# Patient Record
Sex: Female | Born: 1965 | Race: White | Hispanic: No | Smoking: Never smoker
Health system: Southern US, Community
[De-identification: ages and names within clinical notes are randomized; demographics above are authoritative.]

## PROBLEM LIST (undated history)

## (undated) DIAGNOSIS — R011 Cardiac murmur, unspecified: Secondary | ICD-10-CM

## (undated) DIAGNOSIS — T7840XA Allergy, unspecified, initial encounter: Secondary | ICD-10-CM

## (undated) DIAGNOSIS — R112 Nausea with vomiting, unspecified: Secondary | ICD-10-CM

## (undated) DIAGNOSIS — F419 Anxiety disorder, unspecified: Secondary | ICD-10-CM

## (undated) DIAGNOSIS — J309 Allergic rhinitis, unspecified: Secondary | ICD-10-CM

## (undated) DIAGNOSIS — N838 Other noninflammatory disorders of ovary, fallopian tube and broad ligament: Secondary | ICD-10-CM

## (undated) DIAGNOSIS — Z9889 Other specified postprocedural states: Secondary | ICD-10-CM

## (undated) DIAGNOSIS — E7800 Pure hypercholesterolemia, unspecified: Secondary | ICD-10-CM

## (undated) DIAGNOSIS — M1990 Unspecified osteoarthritis, unspecified site: Secondary | ICD-10-CM

## (undated) DIAGNOSIS — B029 Zoster without complications: Secondary | ICD-10-CM

## (undated) DIAGNOSIS — H6500 Acute serous otitis media, unspecified ear: Secondary | ICD-10-CM

## (undated) DIAGNOSIS — E039 Hypothyroidism, unspecified: Secondary | ICD-10-CM

## (undated) DIAGNOSIS — J209 Acute bronchitis, unspecified: Secondary | ICD-10-CM

## (undated) DIAGNOSIS — D1803 Hemangioma of intra-abdominal structures: Secondary | ICD-10-CM

## (undated) DIAGNOSIS — R062 Wheezing: Secondary | ICD-10-CM

## (undated) DIAGNOSIS — J45909 Unspecified asthma, uncomplicated: Secondary | ICD-10-CM

## (undated) HISTORY — PX: REDUCTION MAMMAPLASTY: SUR839

## (undated) HISTORY — DX: Other specified postprocedural states: Z98.890

## (undated) HISTORY — DX: Anxiety disorder, unspecified: F41.9

## (undated) HISTORY — PX: WISDOM TOOTH EXTRACTION: SHX21

## (undated) HISTORY — DX: Wheezing: R06.2

## (undated) HISTORY — DX: Hypothyroidism, unspecified: E03.9

## (undated) HISTORY — DX: Unspecified asthma, uncomplicated: J45.909

## (undated) HISTORY — DX: Pure hypercholesterolemia, unspecified: E78.00

## (undated) HISTORY — DX: Allergy, unspecified, initial encounter: T78.40XA

## (undated) HISTORY — PX: OTHER SURGICAL HISTORY: SHX169

## (undated) HISTORY — DX: Unspecified osteoarthritis, unspecified site: M19.90

## (undated) HISTORY — PX: SHOULDER ARTHROSCOPY: SHX128

## (undated) HISTORY — DX: Acute serous otitis media, unspecified ear: H65.00

## (undated) HISTORY — DX: Acute bronchitis, unspecified: J20.9

## (undated) HISTORY — DX: Other specified postprocedural states: R11.2

## (undated) HISTORY — DX: Zoster without complications: B02.9

## (undated) HISTORY — DX: Allergic rhinitis, unspecified: J30.9

---

## 2001-08-01 ENCOUNTER — Other Ambulatory Visit: Admission: RE | Admit: 2001-08-01 | Discharge: 2001-08-01 | Payer: Self-pay | Admitting: Obstetrics and Gynecology

## 2002-03-26 ENCOUNTER — Inpatient Hospital Stay (HOSPITAL_COMMUNITY): Admission: AD | Admit: 2002-03-26 | Discharge: 2002-03-26 | Payer: Self-pay | Admitting: Obstetrics and Gynecology

## 2002-05-14 ENCOUNTER — Other Ambulatory Visit: Admission: RE | Admit: 2002-05-14 | Discharge: 2002-05-14 | Payer: Self-pay | Admitting: Obstetrics and Gynecology

## 2002-12-19 ENCOUNTER — Inpatient Hospital Stay (HOSPITAL_COMMUNITY): Admission: AD | Admit: 2002-12-19 | Discharge: 2002-12-21 | Payer: Self-pay | Admitting: Obstetrics and Gynecology

## 2002-12-24 ENCOUNTER — Encounter: Admission: RE | Admit: 2002-12-24 | Discharge: 2003-01-23 | Payer: Self-pay | Admitting: Obstetrics and Gynecology

## 2003-01-24 ENCOUNTER — Other Ambulatory Visit: Admission: RE | Admit: 2003-01-24 | Discharge: 2003-01-24 | Payer: Self-pay | Admitting: Obstetrics and Gynecology

## 2003-09-16 ENCOUNTER — Emergency Department (HOSPITAL_COMMUNITY): Admission: EM | Admit: 2003-09-16 | Discharge: 2003-09-16 | Payer: Self-pay | Admitting: Emergency Medicine

## 2004-01-31 ENCOUNTER — Encounter: Payer: Self-pay | Admitting: Internal Medicine

## 2004-01-31 LAB — CONVERTED CEMR LAB

## 2004-03-24 ENCOUNTER — Other Ambulatory Visit: Admission: RE | Admit: 2004-03-24 | Discharge: 2004-03-24 | Payer: Self-pay | Admitting: Obstetrics and Gynecology

## 2004-10-02 ENCOUNTER — Ambulatory Visit: Payer: Self-pay | Admitting: Internal Medicine

## 2005-02-10 ENCOUNTER — Ambulatory Visit: Payer: Self-pay | Admitting: Internal Medicine

## 2005-03-22 ENCOUNTER — Ambulatory Visit: Payer: Self-pay | Admitting: Internal Medicine

## 2005-08-23 ENCOUNTER — Ambulatory Visit: Payer: Self-pay | Admitting: Internal Medicine

## 2007-01-19 ENCOUNTER — Ambulatory Visit: Payer: Self-pay | Admitting: Internal Medicine

## 2007-03-17 ENCOUNTER — Ambulatory Visit: Payer: Self-pay | Admitting: Internal Medicine

## 2007-08-21 ENCOUNTER — Encounter: Payer: Self-pay | Admitting: Internal Medicine

## 2007-08-21 DIAGNOSIS — E7800 Pure hypercholesterolemia, unspecified: Secondary | ICD-10-CM

## 2007-08-21 DIAGNOSIS — E039 Hypothyroidism, unspecified: Secondary | ICD-10-CM

## 2007-08-21 HISTORY — DX: Pure hypercholesterolemia, unspecified: E78.00

## 2007-11-24 ENCOUNTER — Encounter: Payer: Self-pay | Admitting: Internal Medicine

## 2008-03-01 ENCOUNTER — Ambulatory Visit: Payer: Self-pay | Admitting: Internal Medicine

## 2008-03-01 DIAGNOSIS — E785 Hyperlipidemia, unspecified: Secondary | ICD-10-CM | POA: Insufficient documentation

## 2008-03-01 DIAGNOSIS — J309 Allergic rhinitis, unspecified: Secondary | ICD-10-CM

## 2008-03-01 HISTORY — DX: Allergic rhinitis, unspecified: J30.9

## 2008-03-15 ENCOUNTER — Ambulatory Visit: Payer: Self-pay | Admitting: Internal Medicine

## 2008-03-15 DIAGNOSIS — H6500 Acute serous otitis media, unspecified ear: Secondary | ICD-10-CM

## 2008-03-15 HISTORY — DX: Acute serous otitis media, unspecified ear: H65.00

## 2008-06-07 ENCOUNTER — Telehealth (INDEPENDENT_AMBULATORY_CARE_PROVIDER_SITE_OTHER): Payer: Self-pay | Admitting: *Deleted

## 2008-06-07 ENCOUNTER — Ambulatory Visit: Payer: Self-pay | Admitting: Internal Medicine

## 2008-06-07 DIAGNOSIS — B029 Zoster without complications: Secondary | ICD-10-CM | POA: Insufficient documentation

## 2008-06-07 HISTORY — DX: Zoster without complications: B02.9

## 2008-10-14 ENCOUNTER — Ambulatory Visit: Payer: Self-pay | Admitting: Internal Medicine

## 2008-10-14 DIAGNOSIS — J45909 Unspecified asthma, uncomplicated: Secondary | ICD-10-CM

## 2008-10-14 DIAGNOSIS — J209 Acute bronchitis, unspecified: Secondary | ICD-10-CM

## 2008-10-14 HISTORY — DX: Acute bronchitis, unspecified: J20.9

## 2008-12-04 ENCOUNTER — Encounter: Admission: RE | Admit: 2008-12-04 | Discharge: 2008-12-04 | Payer: Self-pay | Admitting: Endocrinology

## 2009-02-03 ENCOUNTER — Ambulatory Visit: Payer: Self-pay | Admitting: Internal Medicine

## 2009-02-12 ENCOUNTER — Encounter: Payer: Self-pay | Admitting: Internal Medicine

## 2009-04-03 ENCOUNTER — Ambulatory Visit: Payer: Self-pay | Admitting: Internal Medicine

## 2009-10-14 ENCOUNTER — Ambulatory Visit: Payer: Self-pay | Admitting: Internal Medicine

## 2009-10-14 DIAGNOSIS — R062 Wheezing: Secondary | ICD-10-CM

## 2009-10-14 HISTORY — DX: Wheezing: R06.2

## 2009-12-02 ENCOUNTER — Encounter: Payer: Self-pay | Admitting: Internal Medicine

## 2010-02-09 ENCOUNTER — Ambulatory Visit: Payer: Self-pay | Admitting: Internal Medicine

## 2010-12-03 NOTE — Assessment & Plan Note (Signed)
Summary: tb skin test/Loza Prell/cd  Nurse Visit   Allergies: No Known Drug Allergies  Immunizations Administered:  PPD Skin Test:    Vaccine Type: PPD    Site: right forearm    Dose: 0.1 ml    Route: ID    Given by: Lucious Groves    Exp. Date: 02/25/2011    Lot #: [id number]  PPD Results    Date of reading: 02/11/2010    Results: < 5mm    Interpretation: negative  Orders Added: 1)  TB Skin Test [86580] 2)  Admin 1st Vaccine [82956]

## 2010-12-03 NOTE — Letter (Signed)
Summary: [facility]  [facility]   Imported By: Lester [place] 12/22/2009 09:45:36  _____________________________________________________________________  External Attachment:    Type:   Image     Comment:   External Document

## 2010-12-18 ENCOUNTER — Encounter: Payer: Self-pay | Admitting: Internal Medicine

## 2010-12-23 NOTE — Miscellaneous (Signed)
Summary: Immunization Entry   Immunization History:  Influenza Immunization History:    Influenza:  historical (12/17/2010)   Immunization History:  Influenza Immunization History:    Influenza:  historical (12/17/2010)  [facility] [place] Influenza Site- Right Del[employer].- [employer] Lot- # [id number] Date -12/17/2010

## 2011-01-29 ENCOUNTER — Encounter: Payer: Self-pay | Admitting: Internal Medicine

## 2011-01-29 ENCOUNTER — Ambulatory Visit (INDEPENDENT_AMBULATORY_CARE_PROVIDER_SITE_OTHER): Payer: BC Managed Care – PPO | Admitting: Internal Medicine

## 2011-01-29 VITALS — BP 100/70 | HR 74 | Temp 98.4°F | Ht 69.0 in | Wt 183.2 lb

## 2011-01-29 DIAGNOSIS — F988 Other specified behavioral and emotional disorders with onset usually occurring in childhood and adolescence: Secondary | ICD-10-CM

## 2011-01-29 DIAGNOSIS — Z0000 Encounter for general adult medical examination without abnormal findings: Secondary | ICD-10-CM

## 2011-01-29 DIAGNOSIS — Z0001 Encounter for general adult medical examination with abnormal findings: Secondary | ICD-10-CM | POA: Insufficient documentation

## 2011-01-29 MED ORDER — AMPHETAMINE-DEXTROAMPHET ER 20 MG PO CP24: ORAL_CAPSULE | ORAL | Status: DC

## 2011-01-29 NOTE — Patient Instructions (Signed)
Take all new medications as prescribed Continue all other medications as before Please return in 1 mo with Lab testing done 3-5 days before

## 2011-01-29 NOTE — Assessment & Plan Note (Signed)
New diagnosis, satisfies the criteria, and I suspect some mild anxiety which she minimizes, but no other signficant depression or other today;  Will start the adderall ER 20 qd , f/u 1 mo for efficacy, pt counseled on controlled nature of the med, potential side effects and expected efficacy

## 2011-01-29 NOTE — Progress Notes (Signed)
  Subjective:    Patient ID: Stephanie Schwartz, female    DOB: Feb 07, 1966, 45 y.o.   MRN: [id number]  HPI  Here to f/u  - has a new job, Airline pilot for medical device position, working on a computer and phone quite a bit daily with numerous client contacts and reports to be generated; has done this work before but now in different way with  4-5 clients but with much traveling inbetween, feels somewhat unable to focus, comprehend, task completion is difficult and feels as if she is not being able to keep up at this higher level of productivity.  Has always had some difficultly with wandering mind and focus but has not bee limiting with her work activity so far, had some difficulty in school but was able to push through that.  Denies worsening depressive symptoms, suicidal ideation, or panic.  Just hard to manage all the tasks at one time to be eventaully completed .  Has a brother with similar symptoms but no dx or treatment.   Has brother in law, and daughter with treatment for ADD  Past Medical History  Diagnosis Date  . SHINGLES 06/07/2008  . HYPOTHYROIDISM 08/21/2007  . HYPERCHOLESTEROLEMIA 08/21/2007  . HYPERLIPIDEMIA 03/01/2008  . OTITIS MEDIA, SEROUS, ACUTE, LEFT 03/15/2008  . ASTHMATIC BRONCHITIS, ACUTE 10/14/2008  . ALLERGIC RHINITIS 03/01/2008  . ASTHMA 10/14/2008  . Wheezing 10/14/2009   No past surgical history on file.  reports that she has never smoked. She does not have any smokeless tobacco history on file. She reports that she does not drink alcohol. Her drug history not on file. family history includes Alcohol abuse in her other; Cancer in her other; Diabetes in her other; Hyperlipidemia in her other; and Hypertension in her other. No Known Allergies  Review of Systems Review of Systems  Constitutional: Negative for diaphoresis and unexpected weight change.  HENT: Negative for drooling and tinnitus.   Eyes: Negative for photophobia and visual disturbance.  Respiratory: Negative for  choking and stridor.   Gastrointestinal: Negative for vomiting and blood in stool.  Genitourinary: Negative for hematuria and decreased urine volume.  Musculoskeletal: Negative for gait problem.  Skin: Negative for color change and wound.  Neurological: Negative for tremors and numbness.  Psychiatric/BehavioraL:  As above     Objective:   Physical Exam BP 100/70  Pulse 74  Temp(Src) 98.4 F (36.9 C) (Oral)  Ht 5\' 9"  (1.753 m)  Wt 183 lb 4 oz (83.122 kg)  BMI 27.06 kg/m2  SpO2 99%  LMP 01/29/2011 Physical Exam  VS noted Constitutional: Pt appears well-developed and well-nourished.  HENT: Head: Normocephalic.  Right Ear: External ear normal.  Left Ear: External ear normal.  Eyes: Conjunctivae and EOM are normal. Pupils are equal, round, and reactive to light.  Neck: Normal range of motion. Neck supple.  Cardiovascular: Normal rate and regular rhythm.   Pulmonary/Chest: Effort normal and breath sounds normal.  Abd:  Soft, NT, non-distended, + BS Neurological: Pt is alert. No cranial nerve deficit.  Skin: Skin is warm. No erythema.  Psychiatric: Pt behavior is normal. Thought content normal. 1+ nervous, somewhat hyperactive today         Assessment & Plan:

## 2011-03-01 ENCOUNTER — Ambulatory Visit (INDEPENDENT_AMBULATORY_CARE_PROVIDER_SITE_OTHER): Payer: BC Managed Care – PPO | Admitting: Internal Medicine

## 2011-03-01 ENCOUNTER — Encounter: Payer: Self-pay | Admitting: Internal Medicine

## 2011-03-01 VITALS — BP 110/78 | HR 67 | Temp 98.2°F | Ht 69.0 in | Wt 184.2 lb

## 2011-03-01 DIAGNOSIS — Z0000 Encounter for general adult medical examination without abnormal findings: Secondary | ICD-10-CM

## 2011-03-01 DIAGNOSIS — F988 Other specified behavioral and emotional disorders with onset usually occurring in childhood and adolescence: Secondary | ICD-10-CM

## 2011-03-01 MED ORDER — AMPHETAMINE-DEXTROAMPHET ER 20 MG PO CP24: ORAL_CAPSULE | ORAL | Status: DC

## 2011-03-01 NOTE — Progress Notes (Signed)
Subjective:    Patient ID: Stephanie Schwartz, female    DOB: [birth year], 45 y.o.   MRN: [id number]  HPI Here for wellness and f/u;  Overall doing ok;  Pt denies CP, worsening SOB, DOE, wheezing, orthopnea, PND, worsening LE edema, palpitations, dizziness or syncope.  Pt denies neurological change such as new Headache, facial or extremity weakness.  Pt denies polydipsia, polyuria, or low sugar symptoms. Pt states overall good compliance with treatment and medications, good tolerability, and trying to follow lower cholesterol diet.  Pt denies worsening depressive symptoms, suicidal ideation or panic. No fever, wt loss, night sweats, loss of appetite, or other constitutional symptoms.  Pt states good ability with ADL's, low fall risk, home safety reviewed and adequate, no significant changes in hearing or vision, and occasionally active with exercise.  Adderall working well at current dosing with respect to task completion and concentration ability, work International aid/development worker.  Does not need TSH done today as she just had it done 1 mo ago with endo. Past Medical History  Diagnosis Date  . SHINGLES 06/07/2008  . HYPOTHYROIDISM 08/21/2007  . HYPERCHOLESTEROLEMIA 08/21/2007  . HYPERLIPIDEMIA 03/01/2008  . OTITIS MEDIA, SEROUS, ACUTE, LEFT 03/15/2008  . ASTHMATIC BRONCHITIS, ACUTE 10/14/2008  . ALLERGIC RHINITIS 03/01/2008  . ASTHMA 10/14/2008  . Wheezing 10/14/2009   No past surgical history on file.  reports that she has never smoked. She does not have any smokeless tobacco history on file. She reports that she does not drink alcohol. Her drug history not on file. family history includes Alcohol abuse in her other; Cancer in her other; Diabetes in her other; Hyperlipidemia in her other; and Hypertension in her other. No Known Allergies Current Outpatient Prescriptions on File Prior to Visit  Medication Sig Dispense Refill  . albuterol (VENTOLIN HFA) 108 (90 BASE) MCG/ACT inhaler Inhale 2 puffs into the lungs every  4 (four) hours as needed. For wheezing       . amphetamine-dextroamphetamine (ADDERALL XR, 20MG ,) 20 MG 24 hr capsule 1 by mouth per day - to fill Jan 29, 2011  30 capsule  0  . fluticasone (FLOVENT HFA) 110 MCG/ACT inhaler Inhale 1 puff into the lungs 2 (two) times daily.        Marland Kitchen levothyroxine (SYNTHROID, LEVOTHROID) 175 MCG tablet Take 175 mcg by mouth daily.        Marland Kitchen loratadine-pseudoephedrine (CLARITIN-D 24-HOUR) 10-240 MG per 24 hr tablet Take 1 tablet by mouth daily.        Marland Kitchen azithromycin (ZITHROMAX) 250 MG tablet Take 2 tablets by mouth on day 1, followed by 1 tablet by mouth daily for 4 days. 2 by mouth for 1 day, then 1 by mouth every day for 4 days, then stop.       Marland Kitchen HYDROcodone-homatropine (HYCODAN) 5-1.5 MG/5ML syrup Take by mouth. 1 teaspoon by mouth every 6 hours as needed for cough       . methylPREDNISolone (MEDROL DOSEPACK) 4 MG tablet Take 4 mg by mouth. Use as directed by mouth x 1 pack        Review of Systems Review of Systems  Constitutional: Negative for diaphoresis, activity change, appetite change and unexpected weight change.  HENT: Negative for hearing loss, ear pain, facial swelling, mouth sores and neck stiffness.   Eyes: Negative for pain, redness and visual disturbance.  Respiratory: Negative for shortness of breath and wheezing.   Cardiovascular: Negative for chest pain and palpitations.  Gastrointestinal: Negative for diarrhea, blood in stool, abdominal distention  and rectal pain.  Genitourinary: Negative for hematuria, flank pain and decreased urine volume.  Musculoskeletal: Negative for myalgias and joint swelling.  Skin: Negative for color change and wound.  Neurological: Negative for syncope and numbness.  Hematological: Negative for adenopathy.  Psychiatric/Behavioral: Negative for hallucinations, self-injury, decreased concentration and agitation.      Objective:   Physical Exam BP 110/78  Pulse 67  Temp(Src) 98.2 F (36.8 C) (Oral)  Ht 5\' 9"   (1.753 m)  Wt 184 lb 4 oz (83.575 kg)  BMI 27.21 kg/m2  SpO2 97%  LMP 02/24/2011 Physical Exam  VS noted Constitutional: Pt is oriented to person, place, and time. Appears well-developed and well-nourished.  HENT:  Head: Normocephalic and atraumatic.  Right Ear: External ear normal.  Left Ear: External ear normal.  Nose: Nose normal.  Mouth/Throat: Oropharynx is clear and moist.  Eyes: Conjunctivae and EOM are normal. Pupils are equal, round, and reactive to light.  Neck: Normal range of motion. Neck supple. No JVD present. No tracheal deviation present.  Cardiovascular: Normal rate, regular rhythm, normal heart sounds and intact distal pulses.   Pulmonary/Chest: Effort normal and breath sounds normal.  Abdominal: Soft. Bowel sounds are normal. There is no tenderness.  Musculoskeletal: Normal range of motion. Exhibits no edema.  Lymphadenopathy:  Has no cervical adenopathy.  Neurological: Pt is alert and oriented to person, place, and time. Pt has normal reflexes. No cranial nerve deficit.  Skin: Skin is warm and dry. No rash noted.  Psychiatric:  Has  normal mood and affect. Behavior is normal.         Assessment & Plan:

## 2011-03-01 NOTE — Patient Instructions (Addendum)
Please tell the lab you do not need the TSH done (OK to cancel) Please go to LAB in the Basement for the blood and/or urine tests to be done today Please call the number on the Blue Card (the [employer] System) for results of testing in 2-3 days Continue all other medications as before Please return in 1 year for your yearly visit, or sooner if needed, with Lab testing done 3-5 days before Please remember to followup with your GYN for the yearly pap smear and/or mammogram, as you do

## 2011-03-01 NOTE — Assessment & Plan Note (Signed)

## 2011-03-19 NOTE — H&P (Signed)
   NAMECHAVON, LUCARELLI                          ACCOUNT NO.:  [id number]   MEDICAL RECORD NO.:  [id number]                   PATIENT TYPE:  INP   LOCATION:  9169                                 FACILITY:  [facility]   PHYSICIAN:  Lenoard Aden, M.D.             DATE OF BIRTH:  Mar 17, 1966   DATE OF ADMISSION:  12/19/2002  DATE OF DISCHARGE:                                HISTORY & PHYSICAL   CHIEF COMPLAINT:  Active labor.   HISTORY OF PRESENT ILLNESS:  The patient is a 45 year old white female, G1,  P0, EDD of December 16, 2002 who presents in active labor at 40-3/7 weeks.   MEDICATIONS:  Prenatal vitamins and thyroid replacement.   PAST MEDICAL HISTORY:  Hypothyroidism, previous surgery of breast reduction.   FAMILY HISTORY:  Hypertension and adult onset diabetes mellitus.   PHYSICAL EXAMINATION:  GENERAL:  Well-developed, well-nourished white female  in no apparent distress.  HEENT:  Normal.  LUNGS:  Clear.  HEART:  Regular rate and rhythm.  ABDOMEN:  Soft, gravid and nontender.  PELVIC:  Cervix is 5-6 cm, 100% and 0 station.  EXTREMITIES:  No clubbing, cyanosis or edema.  NEUROLOGIC:  Nonfocal.   IMPRESSION:  Term intrauterine pregnancy in active labor.   PLAN:  Proceed with augmentation epidural and attempts at vaginal delivery.                                               Lenoard Aden, M.D.    RJT/MEDQ  D:  12/19/2002  T:  12/19/2002  Job:  [id number]

## 2011-04-06 ENCOUNTER — Ambulatory Visit: Payer: BC Managed Care – PPO | Admitting: Endocrinology

## 2011-10-27 ENCOUNTER — Encounter: Payer: Self-pay | Admitting: Internal Medicine

## 2011-10-27 ENCOUNTER — Telehealth: Payer: Self-pay

## 2011-10-27 ENCOUNTER — Ambulatory Visit (INDEPENDENT_AMBULATORY_CARE_PROVIDER_SITE_OTHER): Payer: BC Managed Care – PPO | Admitting: Internal Medicine

## 2011-10-27 VITALS — BP 112/82 | HR 70 | Temp 97.9°F | Ht 69.0 in | Wt 174.2 lb

## 2011-10-27 DIAGNOSIS — R062 Wheezing: Secondary | ICD-10-CM

## 2011-10-27 DIAGNOSIS — B009 Herpesviral infection, unspecified: Secondary | ICD-10-CM

## 2011-10-27 DIAGNOSIS — B001 Herpesviral vesicular dermatitis: Secondary | ICD-10-CM

## 2011-10-27 DIAGNOSIS — J209 Acute bronchitis, unspecified: Secondary | ICD-10-CM

## 2011-10-27 DIAGNOSIS — Z0000 Encounter for general adult medical examination without abnormal findings: Secondary | ICD-10-CM

## 2011-10-27 MED ORDER — LEVOFLOXACIN 250 MG PO TABS: 250.0000 mg | ORAL_TABLET | Freq: Every day | ORAL | Status: DC

## 2011-10-27 MED ORDER — PREDNISONE 10 MG PO TABS: 10.0000 mg | ORAL_TABLET | Freq: Every day | ORAL | Status: DC

## 2011-10-27 MED ORDER — LEVOFLOXACIN 250 MG PO TABS: 250.0000 mg | ORAL_TABLET | Freq: Every day | ORAL | Status: AC

## 2011-10-27 MED ORDER — VALACYCLOVIR HCL 1 G PO TABS: 1000.0000 mg | ORAL_TABLET | Freq: Two times a day (BID) | ORAL | Status: DC

## 2011-10-27 MED ORDER — HYDROCODONE-HOMATROPINE 5-1.5 MG/5ML PO SYRP: 5.0000 mL | ORAL_SOLUTION | Freq: Four times a day (QID) | ORAL | Status: AC | PRN

## 2011-10-27 NOTE — Assessment & Plan Note (Signed)
Mild to mod, for prednisone course,  to f/u any worsening symptoms or concerns 

## 2011-10-27 NOTE — Telephone Encounter (Signed)
Patient has cold sores (2nd occurrence in the past 2 months) and would like a prescription called in. Call back number for the patient is [phone]

## 2011-10-27 NOTE — Assessment & Plan Note (Signed)
Mild to mod, for antibx course,  to f/u any worsening symptoms or concerns 

## 2011-10-27 NOTE — Telephone Encounter (Signed)
Called the patient left message prescription requested has been sent to pharmacy.

## 2011-10-27 NOTE — Progress Notes (Signed)
Subjective:    Patient ID: Stephanie Schwartz, female    DOB: 07-13-1966, 45 y.o.   MRN: [id number]  HPI   Here with 3 days acute onset fever, facial pain, pressure, general weakness and malaise, and greenish d/c. Also with Here with acute onset mild to mod 2-3 days ST, HA, general weakness and malaise, with prod cough greenish sputum, but Pt denies chest pain, increased sob or doe, wheezing, orthopnea, PND, increased LE swelling, palpitations, dizziness or syncope, except for onset mild wheezing this am.  No other acute complaints,  Not pregnant. Using her inhaler at least twice per day for the last 3 days with decreased dyspnea on exertion.  Also with onset cold sore to right upper lip today, second episode in 3 months, no prior hx before that.   Past Medical History  Diagnosis Date  . SHINGLES 06/07/2008  . HYPOTHYROIDISM 08/21/2007  . HYPERCHOLESTEROLEMIA 08/21/2007  . HYPERLIPIDEMIA 03/01/2008  . OTITIS MEDIA, SEROUS, ACUTE, LEFT 03/15/2008  . ASTHMATIC BRONCHITIS, ACUTE 10/14/2008  . ALLERGIC RHINITIS 03/01/2008  . ASTHMA 10/14/2008  . Wheezing 10/14/2009   No past surgical history on file.  reports that she has never smoked. She does not have any smokeless tobacco history on file. She reports that she does not drink alcohol. Her drug history not on file. family history includes Alcohol abuse in her other; Cancer in her other; Diabetes in her other; Hyperlipidemia in her other; and Hypertension in her other. No Known Allergies Current Outpatient Prescriptions on File Prior to Visit  Medication Sig Dispense Refill  . albuterol (VENTOLIN HFA) 108 (90 BASE) MCG/ACT inhaler Inhale 2 puffs into the lungs every 4 (four) hours as needed. For wheezing       . amphetamine-dextroamphetamine (ADDERALL XR, 20MG ,) 20 MG 24 hr capsule 1 by mouth per day - to fill April 29, 2011  30 capsule  0  . fluticasone (FLOVENT HFA) 110 MCG/ACT inhaler Inhale 1 puff into the lungs 2 (two) times daily.        Marland Kitchen  loratadine-pseudoephedrine (CLARITIN-D 24-HOUR) 10-240 MG per 24 hr tablet Take 1 tablet by mouth daily.        Marland Kitchen levothyroxine (SYNTHROID, LEVOTHROID) 175 MCG tablet Take 175 mcg by mouth daily.         Review of Systems Review of Systems  Constitutional: Negative for diaphoresis and unexpected weight change.  HENT: Negative for drooling and tinnitus.   Eyes: Negative for photophobia and visual disturbance.  Respiratory: Negative for choking and stridor.   Gastrointestinal: Negative for vomiting and blood in stool.  Genitourinary: Negative for hematuria and decreased urine volume.  Objective:   Physical Exam BP 112/82  Pulse 70  Temp(Src) 97.9 F (36.6 C) (Oral)  Ht 5\' 9"  (1.753 m)  Wt 174 lb 4 oz (79.039 kg)  BMI 25.73 kg/m2  SpO2 98%  LMP 09/25/2011 Physical Exam  VS noted Constitutional: Pt appears well-developed and well-nourished.  HENT: Head: Normocephalic.  Right Ear: External ear normal.  Left Ear: External ear normal.  Right upper lip with onset typical early cold sore without ulceration but tender, swelling approx 5 mm Bilat tm's mild erythema.  Sinus tender bilat.  Pharynx mild erythema Eyes: Conjunctivae and EOM are normal. Pupils are equal, round, and reactive to light.  Neck: Normal range of motion. Neck supple.  Cardiovascular: Normal rate and regular rhythm.   Pulmonary/Chest: Effort normal and breath sounds mild decreased with mild bilat wheeze.  Neurological: Pt is alert. No cranial  nerve deficit.  Skin: Skin is warm. No erythema.  Psychiatric: Pt behavior is normal. Thought content normal.     Assessment & Plan:

## 2011-10-27 NOTE — Patient Instructions (Addendum)
Take all new medications as prescribed - the antibiotic, cough medicine, prednisone, and generic valtrex Continue all other medications as before Please return in 6 mo with Lab testing done 3-5 days before

## 2011-10-27 NOTE — Telephone Encounter (Signed)
Ok for valtrex asd - done per [employer]

## 2011-12-22 ENCOUNTER — Other Ambulatory Visit: Payer: Self-pay | Admitting: Internal Medicine

## 2011-12-22 MED ORDER — LEVOFLOXACIN 250 MG PO TABS: 250.0000 mg | ORAL_TABLET | Freq: Every day | ORAL | Status: AC

## 2011-12-22 NOTE — Telephone Encounter (Signed)
Pt partner here today; pt with overt sinusitis, out of town - for antibx

## 2012-02-25 ENCOUNTER — Ambulatory Visit (INDEPENDENT_AMBULATORY_CARE_PROVIDER_SITE_OTHER): Payer: BC Managed Care – PPO | Admitting: Internal Medicine

## 2012-02-25 ENCOUNTER — Encounter: Payer: Self-pay | Admitting: Internal Medicine

## 2012-02-25 VITALS — BP 108/78 | HR 71 | Temp 98.1°F | Ht 69.0 in | Wt 168.2 lb

## 2012-02-25 DIAGNOSIS — F988 Other specified behavioral and emotional disorders with onset usually occurring in childhood and adolescence: Secondary | ICD-10-CM

## 2012-02-25 DIAGNOSIS — F419 Anxiety disorder, unspecified: Secondary | ICD-10-CM | POA: Insufficient documentation

## 2012-02-25 DIAGNOSIS — G4700 Insomnia, unspecified: Secondary | ICD-10-CM

## 2012-02-25 DIAGNOSIS — F411 Generalized anxiety disorder: Secondary | ICD-10-CM

## 2012-02-25 HISTORY — DX: Anxiety disorder, unspecified: F41.9

## 2012-02-25 MED ORDER — ALPRAZOLAM 0.5 MG PO TABS: ORAL_TABLET | ORAL | Status: DC

## 2012-02-25 MED ORDER — ZOLPIDEM TARTRATE ER 12.5 MG PO TBCR: 12.5000 mg | EXTENDED_RELEASE_TABLET | Freq: Every evening | ORAL | Status: DC | PRN

## 2012-02-25 MED ORDER — ALPRAZOLAM 0.5 MG PO TBDP: ORAL_TABLET | ORAL | Status: DC

## 2012-02-25 NOTE — Patient Instructions (Addendum)
Take all new medications as prescribed Continue all other medications as before Please return in 3 mo with Lab testing done 3-5 days before, or sooner if needed

## 2012-02-27 ENCOUNTER — Encounter: Payer: Self-pay | Admitting: Internal Medicine

## 2012-02-27 MED ORDER — BUDESONIDE-FORMOTEROL FUMARATE 160-4.5 MCG/ACT IN AERO: 2.0000 | INHALATION_SPRAY | Freq: Two times a day (BID) | RESPIRATORY_TRACT | Status: DC

## 2012-02-27 MED ORDER — THYROID 180 MG PO TABS: 180.0000 mg | ORAL_TABLET | Freq: Every day | ORAL | Status: DC

## 2012-02-27 NOTE — Progress Notes (Signed)
  Subjective:    Patient ID: Stephanie Schwartz, female    DOB: [birth year], 46 y.o.   MRN: [id number]  HPI  Here to f/u, c/o 3 months acute onset marked anxiety and mild depressive symptoms related to recent relationship difficulties with signficant other;  Has been several sessions with counseling which has helped, but still with episodes of near panic, very uncomfortable, also with marked difficulty sleeping with hard to get to sleep, and keeps waking up at 3am.  Denies suicidal ideation.  Not taking adderall recently, not needed at this time per pt.  On symbicort and armour thyroid per endo, Denies hyper or hypo thyroid symptoms such as voice, skin or hair change.  Past Medical History  Diagnosis Date  . SHINGLES 06/07/2008  . HYPOTHYROIDISM 08/21/2007  . HYPERCHOLESTEROLEMIA 08/21/2007  . HYPERLIPIDEMIA 03/01/2008  . OTITIS MEDIA, SEROUS, ACUTE, LEFT 03/15/2008  . ASTHMATIC BRONCHITIS, ACUTE 10/14/2008  . ALLERGIC RHINITIS 03/01/2008  . ASTHMA 10/14/2008  . Wheezing 10/14/2009  . Anxiety 02/25/2012   History reviewed. No pertinent past surgical history.  reports that she has never smoked. She has never used smokeless tobacco. She reports that she does not drink alcohol or use illicit drugs. family history includes Alcohol abuse in her other; Cancer in her other; Diabetes in her other; Hyperlipidemia in her other; and Hypertension in her other. No Known Allergies Current Outpatient Prescriptions on File Prior to Visit  Medication Sig Dispense Refill  . albuterol (VENTOLIN HFA) 108 (90 BASE) MCG/ACT inhaler Inhale 2 puffs into the lungs every 4 (four) hours as needed. For wheezing       . loratadine-pseudoephedrine (CLARITIN-D 24-HOUR) 10-240 MG per 24 hr tablet Take 1 tablet by mouth daily.        . budesonide-formoterol (SYMBICORT) 160-4.5 MCG/ACT inhaler Inhale 2 puffs into the lungs 2 (two) times daily.  1 Inhaler  12  . thyroid (ARMOUR THYROID) 180 MG tablet Take 1 tablet (180 mg total) by  mouth daily.  90 tablet  3  . zolpidem (AMBIEN CR) 12.5 MG CR tablet Take 1 tablet (12.5 mg total) by mouth at bedtime as needed for sleep.  30 tablet  2   Review of Systems Review of Systems  Constitutional: Negative for diaphoresis and unexpected weight change.  Eyes: Negative for photophobia and visual disturbance. or HA Respiratory: Negative for choking and stridor.   Gastrointestinal: Negative for vomiting and blood in stool.  Genitourinary: Negative for hematuria and decreased urine volume.  Musculoskeletal: Negative for gait problem.  Skin: Negative for color change and wound.  Neurological: Negative for tremors and numbness.    Objective:   Physical Exam BP 108/78  Pulse 71  Temp(Src) 98.1 F (36.7 C) (Oral)  Ht 5\' 9"  (1.753 m)  Wt 168 lb 4 oz (76.318 kg)  BMI 24.85 kg/m2  SpO2 97% Physical Exam  VS noted Constitutional: Pt appears well-developed and well-nourished.  HENT: Head: Normocephalic.  Right Ear: External ear normal.  Left Ear: External ear normal.  Eyes: Conjunctivae and EOM are normal. Pupils are equal, round, and reactive to light.  Neck: Normal range of motion. Neck supple.  Cardiovascular: Normal rate and regular rhythm.   Pulmonary/Chest: Effort normal and breath sounds normal.  Psychiatric: Pt behavior is normal. Thought content normal. 2+ nervous, tearful, not depressed affect    Assessment & Plan:

## 2012-02-27 NOTE — Assessment & Plan Note (Signed)
Mild situational - for xanax prn,  to f/u any worsening symptoms or concerns

## 2012-02-27 NOTE — Assessment & Plan Note (Signed)
stable overall by hx and exam, currently not taking meds,  to f/u any worsening symptoms or concerns

## 2012-02-27 NOTE — Assessment & Plan Note (Signed)
Mild situational, cont cousneling, for ambien prn,  to f/u any worsening symptoms or concerns

## 2012-02-28 ENCOUNTER — Telehealth: Payer: Self-pay

## 2012-02-28 MED ORDER — ESCITALOPRAM OXALATE 10 MG PO TABS: 10.0000 mg | ORAL_TABLET | Freq: Every day | ORAL | Status: DC

## 2012-02-28 NOTE — Telephone Encounter (Signed)
The patient called to inform she has seen her therapist.  The therapist did recommend that the patient would be a good candidate for Antidepressants.  The patient stated JWJ had requested trying Lexapro. Call back number is [phone] please advise

## 2012-02-28 NOTE — Telephone Encounter (Signed)
Done per emr 

## 2012-02-28 NOTE — Telephone Encounter (Signed)
Patient informed. 

## 2012-04-24 ENCOUNTER — Other Ambulatory Visit: Payer: Self-pay | Admitting: Internal Medicine

## 2012-04-24 NOTE — Telephone Encounter (Signed)
Done hardcopy to robin  

## 2012-04-25 NOTE — Telephone Encounter (Signed)
Faxed hardcopy to pharmacy. 

## 2012-05-17 ENCOUNTER — Other Ambulatory Visit (INDEPENDENT_AMBULATORY_CARE_PROVIDER_SITE_OTHER): Payer: BC Managed Care – PPO

## 2012-05-17 DIAGNOSIS — Z0000 Encounter for general adult medical examination without abnormal findings: Secondary | ICD-10-CM

## 2012-05-17 LAB — URINALYSIS, ROUTINE W REFLEX MICROSCOPIC
Ketones, ur: NEGATIVE
Specific Gravity, Urine: 1.015 (ref 1.000–1.030)
Total Protein, Urine: NEGATIVE
Urine Glucose: NEGATIVE

## 2012-05-17 LAB — LIPID PANEL
Cholesterol: 230 mg/dL — ABNORMAL HIGH (ref 0–200)
HDL: 68.2 mg/dL (ref >39.00)
Total CHOL/HDL Ratio: 3
Triglycerides: 85 mg/dL (ref 0.0–149.0)
VLDL: 17 mg/dL (ref 0.0–40.0)

## 2012-05-17 LAB — HEPATIC FUNCTION PANEL
ALT: 22 U/L (ref 0–35)
AST: 21 U/L (ref 0–37)
Albumin: 4 g/dL (ref 3.5–5.2)
Alkaline Phosphatase: 55 U/L (ref 39–117)
Total Bilirubin: 0.4 mg/dL (ref 0.3–1.2)

## 2012-05-17 LAB — BASIC METABOLIC PANEL
BUN: 13 mg/dL (ref 6–23)
Chloride: 106 mEq/L (ref 96–112)
Glucose, Bld: 90 mg/dL (ref 70–99)
Potassium: 4.6 mEq/L (ref 3.5–5.1)

## 2012-05-17 LAB — CBC WITH DIFFERENTIAL/PLATELET
Basophils Relative: 1.2 % (ref 0.0–3.0)
Eosinophils Absolute: 0.3 10*3/uL (ref 0.0–0.7)
Lymphs Abs: 1.5 10*3/uL (ref 0.7–4.0)
MCV: 97.7 fl (ref 78.0–100.0)
Neutro Abs: 3.3 10*3/uL (ref 1.4–7.7)
Neutrophils Relative %: 56.9 % (ref 43.0–77.0)
Platelets: 273 10*3/uL (ref 150.0–400.0)
RDW: 13.2 % (ref 11.5–14.6)
WBC: 5.8 10*3/uL (ref 4.5–10.5)

## 2012-05-17 LAB — LDL CHOLESTEROL, DIRECT: Direct LDL: 146 mg/dL

## 2012-05-20 ENCOUNTER — Encounter (HOSPITAL_COMMUNITY): Payer: Self-pay | Admitting: *Deleted

## 2012-05-20 DIAGNOSIS — T148XXA Other injury of unspecified body region, initial encounter: Secondary | ICD-10-CM | POA: Insufficient documentation

## 2012-05-20 DIAGNOSIS — W268XXA Contact with other sharp object(s), not elsewhere classified, initial encounter: Secondary | ICD-10-CM | POA: Insufficient documentation

## 2012-05-20 NOTE — ED Notes (Signed)
The pt has multiple cuts to her rt hand and forearm.  She stuck her rt hand and arm through a house window.  Bleeding controlled on arrival to triage,  Bandaged.  1" lac rt forearm. Cuts to fingers

## 2012-05-21 ENCOUNTER — Emergency Department (HOSPITAL_COMMUNITY)
Admission: EM | Admit: 2012-05-21 | Discharge: 2012-05-21 | Disposition: A | Discharge status: Home / Self Care | Payer: BC Managed Care – PPO | Attending: Emergency Medicine | Admitting: Emergency Medicine

## 2012-05-21 DIAGNOSIS — IMO0002 Reserved for concepts with insufficient information to code with codable children: Secondary | ICD-10-CM

## 2012-05-21 MED ORDER — CEPHALEXIN 500 MG PO CAPS: 500.0000 mg | ORAL_CAPSULE | Freq: Four times a day (QID) | ORAL | Status: AC

## 2012-05-21 MED ORDER — TETANUS-DIPHTH-ACELL PERTUSSIS 5-2.5-18.5 LF-MCG/0.5 IM SUSP
0.5000 mL | Freq: Once | INTRAMUSCULAR | Status: AC
  Administered 2012-05-21: 0.5 mL via INTRAMUSCULAR
  Filled 2012-05-21: qty 0.5

## 2012-05-21 MED ORDER — IBUPROFEN 800 MG PO TABS: 800.0000 mg | ORAL_TABLET | Freq: Three times a day (TID) | ORAL | Status: AC

## 2012-05-21 NOTE — ED Notes (Signed)
The suture cart is in the patients room.

## 2012-05-21 NOTE — ED Provider Notes (Signed)
History     CSN: [id number]  Arrival date & time 05/20/12  2333   First MD Initiated Contact with Patient 05/21/12 0128      Chief Complaint  Patient presents with  . Laceration    (Consider location/radiation/quality/duration/timing/severity/associated sxs/prior treatment) Patient is a 46 y.o. female presenting with skin laceration.  Laceration    Right forearm laceration with multiple small lacerations to right hand. Prior to arrival straight into her house and had to break the glass didn't through window. Bleeding controlled prior to arrival. Pain sharp in quality and not radiating. Does not believe she has any large pieces of glass retained. No weakness or numbness. No difficulty opening or closing her hand. Last tetanus shot unknown. Pain is mild to moderate severity. Past Medical History  Diagnosis Date  . SHINGLES 06/07/2008  . HYPOTHYROIDISM 08/21/2007  . HYPERCHOLESTEROLEMIA 08/21/2007  . HYPERLIPIDEMIA 03/01/2008  . OTITIS MEDIA, SEROUS, ACUTE, LEFT 03/15/2008  . ASTHMATIC BRONCHITIS, ACUTE 10/14/2008  . ALLERGIC RHINITIS 03/01/2008  . ASTHMA 10/14/2008  . Wheezing 10/14/2009  . Anxiety 02/25/2012    History reviewed. No pertinent past surgical history.  Family History  Problem Relation Age of Onset  . Alcohol abuse Other     ETOH  . Cancer Other     Breast Cancer  . Hypertension Other   . Hyperlipidemia Other   . Diabetes Other     History  Substance Use Topics  . Smoking status: Never Smoker   . Smokeless tobacco: Never Used  . Alcohol Use: No    OB History    Grav Para Term Preterm Abortions TAB SAB Ect Mult Living                  Review of Systems  Constitutional: Negative for fever and chills.  HENT: Negative for neck pain.   Eyes: Negative for visual disturbance.  Respiratory: Negative for shortness of breath.   Cardiovascular: Negative for chest pain.  Gastrointestinal: Negative for abdominal pain.  Genitourinary: Negative for flank pain.    Musculoskeletal: Negative for back pain.  Skin: Positive for wound. Negative for rash.  Neurological: Negative for headaches.  All other systems reviewed and are negative.    Allergies  Review of patient's allergies indicates no known allergies.  Home Medications   Current Outpatient Rx  Name Route Sig Dispense Refill  . ALBUTEROL SULFATE HFA 108 (90 BASE) MCG/ACT IN AERS Inhalation Inhale 2 puffs into the lungs every 4 (four) hours as needed. For wheezing     . ALPRAZOLAM 0.5 MG PO TABS  TAKE 1/2 TO 2 TABLETS BY MOUTH DAILY AS NEEDED 60 tablet 1    CYCLE FILL MEDICATION. Authorization is required f ...  . BUDESONIDE-FORMOTEROL FUMARATE 160-4.5 MCG/ACT IN AERO Inhalation Inhale 2 puffs into the lungs 2 (two) times daily. 1 Inhaler 12  . ESCITALOPRAM OXALATE 10 MG PO TABS Oral Take 1 tablet (10 mg total) by mouth daily. 90 tablet 3  . LORATADINE-PSEUDOEPHEDRINE ER 10-240 MG PO TB24 Oral Take 1 tablet by mouth daily.      . THYROID 180 MG PO TABS Oral Take 1 tablet (180 mg total) by mouth daily. 90 tablet 3  . ZOLPIDEM TARTRATE ER 12.5 MG PO TBCR Oral Take 12.5 mg by mouth at bedtime as needed. For sleep      BP 107/62  Pulse 80  Temp 98.4 F (36.9 C) (Oral)  Resp 20  SpO2 98%  Physical Exam  Constitutional: She is oriented to  person, place, and time. She appears well-developed and well-nourished.  HENT:  Head: Normocephalic and atraumatic.  Eyes: Conjunctivae and EOM are normal. Pupils are equal, round, and reactive to light.  Neck: Trachea normal. Neck supple.  Cardiovascular: Normal rate, regular rhythm and normal pulses.   Pulses:      Radial pulses are 2+ on the right side, and 2+ on the left side.  Pulmonary/Chest: Effort normal. No respiratory distress. She has no rhonchi.  Abdominal: Normal appearance.  Musculoskeletal:       Right upper extremity: Multiple superficial lacerations and small shards of glass present right hand and distal right forearm. Bleeding  controlled. There is a 3 cm curved and linear laceration to all our aspect of distal right forearm, it is full thickness moderate gape, no visualized tendons. Distal motor and sensorium intact throughout. Equal radial pulses  Neurological: She is alert and oriented to person, place, and time. She has normal strength. No cranial nerve deficit or sensory deficit. GCS eye subscore is 4. GCS verbal subscore is 5. GCS motor subscore is 6.  Skin: Skin is warm and dry. No rash noted.  Psychiatric: Her speech is normal.       Cooperative and appropriate    ED Course  LACERATION REPAIR Date/Time: 05/21/2012 2:17 AM Performed by: Sunnie Nielsen Authorized by: Sunnie Nielsen Consent: Verbal consent obtained. Risks and benefits: risks, benefits and alternatives were discussed Consent given by: patient Patient understanding: patient states understanding of the procedure being performed Patient consent: the patient's understanding of the procedure matches consent given Procedure consent: procedure consent matches procedure scheduled Required items: required blood products, implants, devices, and special equipment available Patient identity confirmed: verbally with patient Time out: Immediately prior to procedure a "time out" was called to verify the correct patient, procedure, equipment, support staff and site/side marked as required. Body area: upper extremity Location details: right lower arm Laceration length: 3 cm Contamination: The wound is contaminated. Foreign bodies: glass Tendon involvement: none Nerve involvement: none Vascular damage: no Anesthesia: local infiltration Local anesthetic: lidocaine 1% without epinephrine Anesthetic total: 3 ml Preparation: Patient was prepped and draped in the usual sterile fashion. Irrigation solution: saline Irrigation method: syringe Amount of cleaning: standard Debridement: minimal Degree of undermining: none Skin closure: 4-0 nylon Number of sutures:  5 Technique: simple Approximation: close Approximation difficulty: complex Dressing: antibiotic ointment Patient tolerance: Patient tolerated the procedure well with no immediate complications.   (including critical care time)  Laceration without visualized or palpated foreign bodies or glass. No neurovascular deficits or visualized tendon injury.  Tetanus updated.  Plan suture removal 7 days. Infection and scar minimizing precautions provided. MDM   Nursing notes reviewed. Vital signs reviewed. Tetanus updated.  laceration repair as above.        Sunnie Nielsen, MD 05/21/12 [phone]

## 2012-05-26 ENCOUNTER — Ambulatory Visit (INDEPENDENT_AMBULATORY_CARE_PROVIDER_SITE_OTHER): Payer: BC Managed Care – PPO | Admitting: Internal Medicine

## 2012-05-26 ENCOUNTER — Encounter: Payer: Self-pay | Admitting: Internal Medicine

## 2012-05-26 VITALS — BP 110/72 | HR 73 | Temp 98.3°F | Ht 69.0 in | Wt 168.0 lb

## 2012-05-26 DIAGNOSIS — Z0000 Encounter for general adult medical examination without abnormal findings: Secondary | ICD-10-CM

## 2012-05-26 MED ORDER — BUDESONIDE-FORMOTEROL FUMARATE 160-4.5 MCG/ACT IN AERO: 2.0000 | INHALATION_SPRAY | Freq: Two times a day (BID) | RESPIRATORY_TRACT | Status: DC

## 2012-05-26 MED ORDER — ESCITALOPRAM OXALATE 10 MG PO TABS: 10.0000 mg | ORAL_TABLET | Freq: Every day | ORAL | Status: DC

## 2012-05-26 NOTE — Patient Instructions (Addendum)
Your stitches were removed and steri-strips added today Please add neosporin daily for the next several days, and the steri-strips will eventually fall off Continue all other medications as before Your medications were refilled today as requested You are otherwise up to date with prevention Please remember to followup with your GYN for the yearly pap smear and/or mammogram as you normally do Please continue your efforts at being more active, low cholesterol diet, and weight control. Please return in 1 year for your yearly visit, or sooner if needed, with Lab testing done 3-5 days before

## 2012-05-27 ENCOUNTER — Encounter: Payer: Self-pay | Admitting: Internal Medicine

## 2012-05-27 NOTE — Progress Notes (Signed)
Subjective:    Patient ID: Stephanie Schwartz, female    DOB: 1965/12/05, 46 y.o.   MRN: [id number]  HPI  Here for wellness and f/u;  Overall doing ok;  Pt denies CP, worsening SOB, DOE, wheezing, orthopnea, PND, worsening LE edema, palpitations, dizziness or syncope.  Pt denies neurological change such as new Headache, facial or extremity weakness.  Pt denies polydipsia, polyuria, or low sugar symptoms. Pt states overall good compliance with treatment and medications, good tolerability, and trying to follow lower cholesterol diet.  Pt denies worsening depressive symptoms, suicidal ideation or panic. No fever, wt loss, night sweats, loss of appetite, or other constitutional symptoms.  Pt states good ability with ADL's, low fall risk, home safety reviewed and adequate, no significant changes in hearing or vision, and occasionally active with exercise.  Needs stitches out to right distal arm today after home accident.  Needs refills meds, does not want to consider statin today.  Denies worsening depressive symptoms, suicidal ideation, or panic. Past Medical History  Diagnosis Date  . SHINGLES 06/07/2008  . HYPOTHYROIDISM 08/21/2007  . HYPERCHOLESTEROLEMIA 08/21/2007  . HYPERLIPIDEMIA 03/01/2008  . OTITIS MEDIA, SEROUS, ACUTE, LEFT 03/15/2008  . ASTHMATIC BRONCHITIS, ACUTE 10/14/2008  . ALLERGIC RHINITIS 03/01/2008  . ASTHMA 10/14/2008  . Wheezing 10/14/2009  . Anxiety 02/25/2012   No past surgical history on file.  reports that she has never smoked. She has never used smokeless tobacco. She reports that she does not drink alcohol or use illicit drugs. family history includes Alcohol abuse in her other; Cancer in her other; Diabetes in her other; Hyperlipidemia in her other; and Hypertension in her other. No Known Allergies Current Outpatient Prescriptions on File Prior to Visit  Medication Sig Dispense Refill  . albuterol (VENTOLIN HFA) 108 (90 BASE) MCG/ACT inhaler Inhale 2 puffs into the lungs every 4  (four) hours as needed. For wheezing       . ALPRAZolam (XANAX) 0.5 MG tablet TAKE 1/2 TO 2 TABLETS BY MOUTH DAILY AS NEEDED  60 tablet  1  . budesonide-formoterol (SYMBICORT) 160-4.5 MCG/ACT inhaler Inhale 2 puffs into the lungs 2 (two) times daily.  1 Inhaler  11  . cephALEXin (KEFLEX) 500 MG capsule Take 1 capsule (500 mg total) by mouth 4 (four) times daily.  40 capsule  0  . escitalopram (LEXAPRO) 10 MG tablet Take 1 tablet (10 mg total) by mouth daily.  90 tablet  3  . ibuprofen (ADVIL,MOTRIN) 800 MG tablet Take 1 tablet (800 mg total) by mouth 3 (three) times daily.  21 tablet  0  . loratadine-pseudoephedrine (CLARITIN-D 24-HOUR) 10-240 MG per 24 hr tablet Take 1 tablet by mouth daily.        Marland Kitchen thyroid (ARMOUR THYROID) 180 MG tablet Take 1 tablet (180 mg total) by mouth daily.  90 tablet  3  . zolpidem (AMBIEN CR) 12.5 MG CR tablet Take 12.5 mg by mouth at bedtime as needed. For sleep       Review of Systems Review of Systems  Constitutional: Negative for diaphoresis, activity change, appetite change and unexpected weight change.  HENT: Negative for hearing loss, ear pain, facial swelling, mouth sores and neck stiffness.   Eyes: Negative for pain, redness and visual disturbance.  Respiratory: Negative for shortness of breath and wheezing.   Cardiovascular: Negative for chest pain and palpitations.  Gastrointestinal: Negative for diarrhea, blood in stool, abdominal distention and rectal pain.  Genitourinary: Negative for hematuria, flank pain and decreased urine volume.  Musculoskeletal: Negative for myalgias and joint swelling.  Skin: Negative for color change and wound.  Neurological: Negative for syncope and numbness.  Hematological: Negative for adenopathy.  Psychiatric/Behavioral: Negative for hallucinations, self-injury, decreased concentration and agitation.      Objective:   Physical Exam BP 110/72  Pulse 73  Temp 98.3 F (36.8 C) (Oral)  Ht 5\' 9"  (1.753 m)  Wt 168 lb  (76.204 kg)  BMI 24.81 kg/m2  SpO2 98% Physical Exam  VS noted Constitutional: Pt is oriented to person, place, and time. Appears well-developed and well-nourished.  HENT:  Head: Normocephalic and atraumatic.  Right Ear: External ear normal.  Left Ear: External ear normal.  Nose: Nose normal.  Mouth/Throat: Oropharynx is clear and moist.  Eyes: Conjunctivae and EOM are normal. Pupils are equal, round, and reactive to light.  Neck: Normal range of motion. Neck supple. No JVD present. No tracheal deviation present.  Cardiovascular: Normal rate, regular rhythm, normal heart sounds and intact distal pulses.   Pulmonary/Chest: Effort normal and breath sounds normal.  Abdominal: Soft. Bowel sounds are normal. There is no tenderness.  Musculoskeletal: Normal range of motion. Exhibits no edema.  Lymphadenopathy:  Has no cervical adenopathy.  Neurological: Pt is alert and oriented to person, place, and time. Pt has normal reflexes. No cranial nerve deficit.  Skin: Skin is warm and dry. No rash noted. Stitches removed right distal arm, no cellulitis today Psychiatric: 1+ nervous, not depressed affect. Behavior is normal.     Assessment & Plan:

## 2012-05-27 NOTE — Assessment & Plan Note (Signed)

## 2012-07-26 ENCOUNTER — Other Ambulatory Visit: Payer: Self-pay | Admitting: Obstetrics and Gynecology

## 2012-07-26 DIAGNOSIS — Z1231 Encounter for screening mammogram for malignant neoplasm of breast: Secondary | ICD-10-CM

## 2012-07-27 ENCOUNTER — Telehealth: Payer: Self-pay

## 2012-07-27 MED ORDER — ESCITALOPRAM OXALATE 20 MG PO TABS: 20.0000 mg | ORAL_TABLET | Freq: Every day | ORAL | Status: DC

## 2012-07-27 NOTE — Telephone Encounter (Signed)
Pt's wife called on pt's behalf to requesting an increase of Lexapro from 10 mg QD to 20 mg QD, please advise.

## 2012-07-27 NOTE — Telephone Encounter (Signed)
Patient informed. 

## 2012-07-27 NOTE — Telephone Encounter (Signed)
Done erx 

## 2012-08-07 ENCOUNTER — Telehealth: Payer: Self-pay | Admitting: Internal Medicine

## 2012-08-07 MED ORDER — AMPHETAMINE-DEXTROAMPHET ER 20 MG PO CP24: 20.0000 mg | ORAL_CAPSULE | ORAL | Status: DC

## 2012-08-07 NOTE — Telephone Encounter (Signed)
Caller: Akylah/Patient; Phone: [phone]; Reason for Call: Calling to get a refill on her Adderall 20mg  3 month supply.  Please call her back.  Thanks

## 2012-08-07 NOTE — Telephone Encounter (Signed)
Called left detailed message that prescriptions requested are ready for pickup at the front desk.

## 2012-08-07 NOTE — Telephone Encounter (Signed)
Done hardcopy to robin  

## 2012-08-18 ENCOUNTER — Ambulatory Visit: Payer: BC Managed Care – PPO

## 2012-08-21 ENCOUNTER — Ambulatory Visit
Admission: RE | Admit: 2012-08-21 | Discharge: 2012-08-21 | Disposition: A | Discharge status: Home / Self Care | Payer: BC Managed Care – PPO | Source: Ambulatory Visit | Attending: Obstetrics and Gynecology | Admitting: Obstetrics and Gynecology

## 2012-08-21 DIAGNOSIS — Z1231 Encounter for screening mammogram for malignant neoplasm of breast: Secondary | ICD-10-CM

## 2012-08-23 LAB — HM MAMMOGRAPHY

## 2012-09-01 ENCOUNTER — Other Ambulatory Visit: Payer: Self-pay | Admitting: Internal Medicine

## 2012-09-01 NOTE — Telephone Encounter (Signed)
Faxed hardcopy to pharmacy. 

## 2012-09-01 NOTE — Telephone Encounter (Signed)
Done hardcopy to robin  

## 2012-09-18 ENCOUNTER — Other Ambulatory Visit: Payer: Self-pay | Admitting: *Deleted

## 2012-09-18 NOTE — Telephone Encounter (Signed)
Pt requesting refill of Adderall-pt states that she usually gets a 3 month's supply.

## 2012-09-18 NOTE — Telephone Encounter (Signed)
Should have one more rx for dec 6 fill date

## 2012-09-19 NOTE — Telephone Encounter (Signed)
Called the patient left message to call back 

## 2012-09-19 NOTE — Telephone Encounter (Signed)
Called left message to call back 

## 2012-09-20 NOTE — Telephone Encounter (Signed)
Called left message to call back 

## 2012-09-20 NOTE — Telephone Encounter (Signed)
Called the patient left message to call back 

## 2012-09-21 NOTE — Telephone Encounter (Signed)
Patient informed. 

## 2013-01-11 ENCOUNTER — Other Ambulatory Visit: Payer: Self-pay

## 2013-01-11 MED ORDER — ALPRAZOLAM 0.5 MG PO TABS: ORAL_TABLET | ORAL | Status: DC

## 2013-01-11 NOTE — Telephone Encounter (Signed)
Faxed hardcopy to pharmacy. 

## 2013-01-11 NOTE — Telephone Encounter (Signed)
Done hardcopy to robin  

## 2013-04-23 ENCOUNTER — Other Ambulatory Visit: Payer: Self-pay

## 2013-04-23 MED ORDER — ZOLPIDEM TARTRATE ER 12.5 MG PO TBCR: EXTENDED_RELEASE_TABLET | ORAL | Status: DC

## 2013-04-23 NOTE — Telephone Encounter (Signed)
Faxed hardcopy to [facility] 

## 2013-04-23 NOTE — Telephone Encounter (Signed)
Done hardcopy to robin  

## 2013-08-10 ENCOUNTER — Other Ambulatory Visit: Payer: Self-pay | Admitting: Internal Medicine

## 2013-11-05 ENCOUNTER — Other Ambulatory Visit: Payer: Self-pay | Admitting: Internal Medicine

## 2013-11-13 ENCOUNTER — Other Ambulatory Visit: Payer: Self-pay

## 2013-11-13 NOTE — Telephone Encounter (Signed)
Very sorry, but due to recnet changes in [place] law and [place] regulations, I would need to ask pt to make ROV for these controlled substance refills, as last visit was July 2013

## 2013-11-13 NOTE — Telephone Encounter (Signed)
Patient called and informed to schedule ROV.  Patient agreed.

## 2014-02-18 ENCOUNTER — Ambulatory Visit: Payer: BC Managed Care – PPO | Admitting: Internal Medicine

## 2014-02-19 ENCOUNTER — Ambulatory Visit (INDEPENDENT_AMBULATORY_CARE_PROVIDER_SITE_OTHER): Payer: BC Managed Care – PPO | Admitting: Internal Medicine

## 2014-02-19 ENCOUNTER — Encounter: Payer: Self-pay | Admitting: Internal Medicine

## 2014-02-19 VITALS — BP 114/70 | HR 62 | Temp 98.6°F | Resp 14 | Wt 177.2 lb

## 2014-02-19 DIAGNOSIS — J309 Allergic rhinitis, unspecified: Secondary | ICD-10-CM

## 2014-02-19 DIAGNOSIS — J209 Acute bronchitis, unspecified: Secondary | ICD-10-CM

## 2014-02-19 MED ORDER — AMOXICILLIN 500 MG PO CAPS: 500.0000 mg | ORAL_CAPSULE | Freq: Three times a day (TID) | ORAL | Status: DC

## 2014-02-19 NOTE — Progress Notes (Signed)
   Subjective:    Patient ID: Stephanie Schwartz, female    DOB: 19-Jun-1966, 48 y.o.   MRN: [id number]  HPI  Symptoms began 02/13/14 after pollen exposure when she had her windows open while sleeping. She developed progressive congestion and as of 4/20 had sore throat, rhinitis, head and chest congestion. She also developed itchy, watery eyes, and sneezing. Her fever was noted to be as high as 100 .  There were no exposures to any ill individuals.  Nonsteroidals, Alka-Seltzer plus, and Claritin-D were of no benefit except for fever.  She now has facial sinus pain, minor sore throat, clear rhinitis, and yellow sputum. The wheezing did improve with Symbicort. The nonsteroidals did resolve the fever. She continues to have itchy, watery eyes and sneezing.  She also describes diffuse myalgias from the waist up.  She did not take the flu shot    Review of Systems  She is not having frontal headache, dental pain, earache, or otic discharge. She also denies any associated shortness of breath.     Objective:   Physical Exam General appearance:good health ;well nourished; no acute distress or increased work of breathing is present.  No  lymphadenopathy about the head, neck, or axilla noted.   Eyes: No conjunctival inflammation or lid edema is present. There is no scleral icterus.  Ears:  External ear exam shows no significant lesions or deformities.  Otoscopic examination reveals clear canals, tympanic membranes are intact bilaterally without bulging, retraction, inflammation or discharge.  Nose:  External nasal examination shows no deformity or inflammation. Nasal mucosa are dry & erythematous without lesions or exudates. No septal dislocation or deviation.No obstruction to airflow.   Oral exam: Dental hygiene is good; lips and gums are healthy appearing. Localized fever blister with early eschar of upper lip. There is no oropharyngeal erythema or exudate noted.   Neck:  No deformities,  masses, or tenderness noted.   Supple with full range of motion without pain.   Heart:  Normal rate and regular rhythm. S1 and S2 normal without gallop, murmur, click, rub or other extra sounds.   Lungs:Chest clear to auscultation; no wheezes, rhonchi,rales ,or rubs present.No increased work of breathing.    Extremities:  No cyanosis, edema, or clubbing  noted    Skin: Warm & dry .         Assessment & Plan:  #1 acute bronchitis w/o bronchospasm #2 allergic rhinitis Plan: See orders and recommendations

## 2014-02-19 NOTE — Patient Instructions (Signed)

## 2014-02-19 NOTE — Progress Notes (Signed)
Pre visit review using our clinic review tool, if applicable. No additional management support is needed unless otherwise documented below in the visit note. 

## 2014-04-05 ENCOUNTER — Encounter: Payer: Self-pay | Admitting: Internal Medicine

## 2014-04-05 ENCOUNTER — Ambulatory Visit (INDEPENDENT_AMBULATORY_CARE_PROVIDER_SITE_OTHER): Payer: BC Managed Care – PPO | Admitting: Internal Medicine

## 2014-04-05 VITALS — BP 110/70 | HR 76 | Temp 98.2°F | Ht 69.0 in | Wt 173.0 lb

## 2014-04-05 DIAGNOSIS — Z0000 Encounter for general adult medical examination without abnormal findings: Secondary | ICD-10-CM

## 2014-04-05 DIAGNOSIS — F988 Other specified behavioral and emotional disorders with onset usually occurring in childhood and adolescence: Secondary | ICD-10-CM

## 2014-04-05 DIAGNOSIS — F411 Generalized anxiety disorder: Secondary | ICD-10-CM

## 2014-04-05 DIAGNOSIS — F419 Anxiety disorder, unspecified: Secondary | ICD-10-CM

## 2014-04-05 DIAGNOSIS — E7800 Pure hypercholesterolemia, unspecified: Secondary | ICD-10-CM

## 2014-04-05 DIAGNOSIS — G4700 Insomnia, unspecified: Secondary | ICD-10-CM

## 2014-04-05 MED ORDER — ZALEPLON 5 MG PO CAPS
5.0000 mg | ORAL_CAPSULE | Freq: Every evening | ORAL | Status: DC | PRN
Start: 2014-04-05 — End: 2014-12-03

## 2014-04-05 MED ORDER — ALPRAZOLAM 0.5 MG PO TABS: 0.5000 mg | ORAL_TABLET | Freq: Every day | ORAL | Status: DC | PRN

## 2014-04-05 NOTE — Assessment & Plan Note (Signed)
stable overall by history and exam, recent data reviewed with pt, and pt to continue medical treatment as before,  to f/u any worsening symptoms or concerns, has persistently elev LDL, trying to work on lower chol diet Lab Results  Component Value Date   CHOL 230* 05/17/2012   HDL 68.20 05/17/2012   LDLDIRECT 146.0 05/17/2012   TRIG 85.0 05/17/2012   CHOLHDL 3 05/17/2012

## 2014-04-05 NOTE — Progress Notes (Signed)
   Subjective:    Patient ID: Stephanie Schwartz, female    DOB: 1966/06/15, 48 y.o.   MRN: [id number]  HPI Here to f/u; overall doing ok,  Pt denies chest pain, increased sob or doe, wheezing, orthopnea, PND, increased LE swelling, palpitations, dizziness or syncope.  Pt denies polydipsia, polyuria, .  Pt denies new neurological symptoms such as new headache, or facial or extremity weakness or numbness.   Pt states overall good compliance with meds, only uses xanax rarely, ambien too strong.  Asks for refills xanax and ambien, but asks to change ambien to [place] for shorter acting. Working steadily, travels a lot, declines need for ADD med, socially stable Past Medical History  Diagnosis Date  . SHINGLES 06/07/2008  . HYPOTHYROIDISM 08/21/2007  . HYPERCHOLESTEROLEMIA 08/21/2007  . HYPERLIPIDEMIA 03/01/2008  . OTITIS MEDIA, SEROUS, ACUTE, LEFT 03/15/2008  . ASTHMATIC BRONCHITIS, ACUTE 10/14/2008  . ALLERGIC RHINITIS 03/01/2008  . ASTHMA 10/14/2008  . Wheezing 10/14/2009  . Anxiety 02/25/2012   No past surgical history on file.  reports that she has never smoked. She has never used smokeless tobacco. She reports that she does not drink alcohol or use illicit drugs. family history includes Alcohol abuse in her other; Cancer in her other; Diabetes in her other; Hyperlipidemia in her other; Hypertension in her other. No Known Allergies Current Outpatient Prescriptions on File Prior to Visit  Medication Sig Dispense Refill  . loratadine-pseudoephedrine (CLARITIN-D 24-HOUR) 10-240 MG per 24 hr tablet Take 1 tablet by mouth daily.        . SYMBICORT 160-4.5 MCG/ACT inhaler INHALE 2 PUFFS INTO THE LUNGS 2 (TWO) TIMES DAILY.  10.2 g  10  . thyroid (ARMOUR THYROID) 180 MG tablet Take 1 tablet (180 mg total) by mouth daily.  90 tablet  3   No current facility-administered medications on file prior to visit.   Review of Systems  Constitutional: Negative for unusual diaphoresis or other sweats  HENT:  Negative for ringing in ear Eyes: Negative for double vision or worsening visual disturbance.  Respiratory: Negative for choking and stridor.   Gastrointestinal: Negative for vomiting or other signifcant bowel change Genitourinary: Negative for hematuria or decreased urine volume.  Musculoskeletal: Negative for other MSK pain or swelling Skin: Negative for color change and worsening wound.  Neurological: Negative for tremors and numbness other than noted  Psychiatric/Behavioral: Negative for decreased concentration or agitation other than above       Objective:   Physical Exam BP 110/70  Pulse 76  Temp(Src) 98.2 F (36.8 C) (Oral)  Ht 5\' 9"  (1.753 m)  Wt 173 lb (78.472 kg)  BMI 25.54 kg/m2  SpO2 96% VS noted,  Constitutional: Pt appears well-developed, well-nourished.  HENT: Head: NCAT.  Right Ear: External ear normal.  Left Ear: External ear normal.  Eyes: . Pupils are equal, round, and reactive to light. Conjunctivae and EOM are normal Neck: Normal range of motion. Neck supple.  Cardiovascular: Normal rate and regular rhythm.   Pulmonary/Chest: Effort normal and breath sounds normal.  Abd:  Soft, NT, ND, + BS Neurological: Pt is alert. Not confused , motor grossly intact Skin: Skin is warm. No rash Psychiatric: Pt behavior is normal. No agitation. mild nervous    Assessment & Plan:

## 2014-04-05 NOTE — Patient Instructions (Signed)
Please take all new medication as prescribed  Please continue all other medications as before, and refills have been done if requested.  Please have the pharmacy call with any other refills you may need.  Please continue your efforts at being more active, low cholesterol diet, and weight control.  Please return in 6 months, or sooner if needed, with Lab testing done 3-5 days before

## 2014-04-05 NOTE — Assessment & Plan Note (Signed)
Mild to mod, for xanax refill,  to f/u any worsening symptoms or concerns 

## 2014-04-05 NOTE — Assessment & Plan Note (Signed)
[place] for change to [place] [facility] prn

## 2014-04-05 NOTE — Assessment & Plan Note (Signed)
stable overall by history and exam, no need for specific tx at this time

## 2014-04-05 NOTE — Progress Notes (Signed)
Pre visit review using our clinic review tool, if applicable. No additional management support is needed unless otherwise documented below in the visit note. 

## 2014-08-13 ENCOUNTER — Other Ambulatory Visit: Payer: Self-pay | Admitting: Internal Medicine

## 2014-12-02 ENCOUNTER — Encounter (HOSPITAL_COMMUNITY): Payer: Self-pay | Admitting: Emergency Medicine

## 2014-12-02 ENCOUNTER — Emergency Department (HOSPITAL_COMMUNITY): Payer: BLUE CROSS/BLUE SHIELD

## 2014-12-02 ENCOUNTER — Inpatient Hospital Stay (HOSPITAL_COMMUNITY)
Admission: EM | Admit: 2014-12-02 | Discharge: 2014-12-03 | DRG: 340 | Disposition: A | Discharge status: Home / Self Care | Payer: BLUE CROSS/BLUE SHIELD | Attending: General Surgery | Admitting: General Surgery

## 2014-12-02 ENCOUNTER — Encounter (HOSPITAL_COMMUNITY): Admission: EM | Disposition: A | Discharge status: Home / Self Care | Payer: Self-pay | Source: Home / Self Care

## 2014-12-02 ENCOUNTER — Inpatient Hospital Stay (HOSPITAL_COMMUNITY): Payer: BLUE CROSS/BLUE SHIELD | Admitting: Anesthesiology

## 2014-12-02 DIAGNOSIS — R1031 Right lower quadrant pain: Secondary | ICD-10-CM

## 2014-12-02 DIAGNOSIS — K3580 Unspecified acute appendicitis: Secondary | ICD-10-CM | POA: Diagnosis present

## 2014-12-02 DIAGNOSIS — Z833 Family history of diabetes mellitus: Secondary | ICD-10-CM

## 2014-12-02 DIAGNOSIS — R109 Unspecified abdominal pain: Secondary | ICD-10-CM

## 2014-12-02 DIAGNOSIS — J45909 Unspecified asthma, uncomplicated: Secondary | ICD-10-CM | POA: Diagnosis present

## 2014-12-02 DIAGNOSIS — Z811 Family history of alcohol abuse and dependence: Secondary | ICD-10-CM

## 2014-12-02 DIAGNOSIS — E780 Pure hypercholesterolemia: Secondary | ICD-10-CM | POA: Diagnosis present

## 2014-12-02 DIAGNOSIS — E039 Hypothyroidism, unspecified: Secondary | ICD-10-CM | POA: Diagnosis present

## 2014-12-02 DIAGNOSIS — K352 Acute appendicitis with generalized peritonitis: Principal | ICD-10-CM | POA: Diagnosis present

## 2014-12-02 DIAGNOSIS — Z8249 Family history of ischemic heart disease and other diseases of the circulatory system: Secondary | ICD-10-CM | POA: Diagnosis not present

## 2014-12-02 DIAGNOSIS — R160 Hepatomegaly, not elsewhere classified: Secondary | ICD-10-CM | POA: Diagnosis present

## 2014-12-02 DIAGNOSIS — E785 Hyperlipidemia, unspecified: Secondary | ICD-10-CM | POA: Diagnosis present

## 2014-12-02 HISTORY — DX: Cardiac murmur, unspecified: R01.1

## 2014-12-02 HISTORY — PX: APPENDECTOMY: SHX54

## 2014-12-02 HISTORY — PX: LAPAROSCOPIC APPENDECTOMY: SHX408

## 2014-12-02 LAB — CBC WITH DIFFERENTIAL/PLATELET
BASOS ABS: 0 10*3/uL (ref 0.0–0.1)
BASOS PCT: 0 % (ref 0–1)
EOS ABS: 0.3 10*3/uL (ref 0.0–0.7)
Eosinophils Relative: 2 % (ref 0–5)
HEMATOCRIT: 34.9 % — AB (ref 36.0–46.0)
HEMOGLOBIN: 11.7 g/dL — AB (ref 12.0–15.0)
Lymphocytes Relative: 8 % — ABNORMAL LOW (ref 12–46)
Lymphs Abs: 0.9 10*3/uL (ref 0.7–4.0)
MCH: 32.1 pg (ref 26.0–34.0)
MCHC: 33.5 g/dL (ref 30.0–36.0)
MCV: 95.6 fL (ref 78.0–100.0)
MONO ABS: 1.2 10*3/uL — AB (ref 0.1–1.0)
MONOS PCT: 10 % (ref 3–12)
NEUTROS ABS: 9.2 10*3/uL — AB (ref 1.7–7.7)
Neutrophils Relative %: 80 % — ABNORMAL HIGH (ref 43–77)
Platelets: 267 10*3/uL (ref 150–400)
RBC: 3.65 MIL/uL — ABNORMAL LOW (ref 3.87–5.11)
RDW: 11.8 % (ref 11.5–15.5)
WBC: 11.6 10*3/uL — ABNORMAL HIGH (ref 4.0–10.5)

## 2014-12-02 LAB — LIPASE, BLOOD: LIPASE: 33 U/L (ref 11–59)

## 2014-12-02 LAB — COMPREHENSIVE METABOLIC PANEL
ALBUMIN: 3.2 g/dL — AB (ref 3.5–5.2)
ALK PHOS: 62 U/L (ref 39–117)
ALT: 26 U/L (ref 0–35)
ANION GAP: 3 — AB (ref 5–15)
AST: 28 U/L (ref 0–37)
BILIRUBIN TOTAL: 0.8 mg/dL (ref 0.3–1.2)
BUN: 11 mg/dL (ref 6–23)
CHLORIDE: 107 mmol/L (ref 96–112)
CO2: 26 mmol/L (ref 19–32)
CREATININE: 0.76 mg/dL (ref 0.50–1.10)
Calcium: 8.3 mg/dL — ABNORMAL LOW (ref 8.4–10.5)
GFR calc Af Amer: >90 mL/min (ref >90)
Glucose, Bld: 104 mg/dL — ABNORMAL HIGH (ref 70–99)
POTASSIUM: 4.1 mmol/L (ref 3.5–5.1)
Sodium: 136 mmol/L (ref 135–145)
Total Protein: 5.5 g/dL — ABNORMAL LOW (ref 6.0–8.3)

## 2014-12-02 LAB — URINE MICROSCOPIC-ADD ON

## 2014-12-02 LAB — URINALYSIS, ROUTINE W REFLEX MICROSCOPIC
BILIRUBIN URINE: NEGATIVE
Glucose, UA: NEGATIVE mg/dL
HGB URINE DIPSTICK: NEGATIVE
KETONES UR: NEGATIVE mg/dL
NITRITE: NEGATIVE
PROTEIN: NEGATIVE mg/dL
Specific Gravity, Urine: 1.025 (ref 1.005–1.030)
UROBILINOGEN UA: 0.2 mg/dL (ref 0.0–1.0)
pH: 7 (ref 5.0–8.0)

## 2014-12-02 LAB — PREGNANCY, URINE: Preg Test, Ur: NEGATIVE

## 2014-12-02 SURGERY — APPENDECTOMY, LAPAROSCOPIC
Anesthesia: General | Site: Abdomen

## 2014-12-02 MED ORDER — KETOROLAC TROMETHAMINE 30 MG/ML IJ SOLN: 30.0000 mg | Freq: Once | INTRAMUSCULAR | Status: DC | PRN

## 2014-12-02 MED ORDER — HYDROMORPHONE HCL 1 MG/ML IJ SOLN
1.0000 mg | Freq: Once | INTRAMUSCULAR | Status: AC
  Administered 2014-12-02: 1 mg via INTRAVENOUS
  Filled 2014-12-02: qty 1

## 2014-12-02 MED ORDER — 0.9 % SODIUM CHLORIDE (POUR BTL) OPTIME
TOPICAL | Status: DC | PRN
  Administered 2014-12-02: 1000 mL

## 2014-12-02 MED ORDER — INFLUENZA VAC SPLIT QUAD 0.5 ML IM SUSY
0.5000 mL | PREFILLED_SYRINGE | INTRAMUSCULAR | Status: AC
  Administered 2014-12-03: 0.5 mL via INTRAMUSCULAR
  Filled 2014-12-02: qty 0.5

## 2014-12-02 MED ORDER — PIPERACILLIN-TAZOBACTAM 3.375 G IVPB
3.3750 g | Freq: Three times a day (TID) | INTRAVENOUS | Status: DC
  Administered 2014-12-02 – 2014-12-03 (×3): 3.375 g via INTRAVENOUS
  Filled 2014-12-02 (×5): qty 50

## 2014-12-02 MED ORDER — SCOPOLAMINE 1 MG/3DAYS TD PT72
MEDICATED_PATCH | TRANSDERMAL | Status: AC
  Administered 2014-12-02: 1.5 mg via TRANSDERMAL
  Filled 2014-12-02: qty 1

## 2014-12-02 MED ORDER — BUPIVACAINE HCL (PF) 0.25 % IJ SOLN
INTRAMUSCULAR | Status: AC
  Filled 2014-12-02: qty 30

## 2014-12-02 MED ORDER — OXYCODONE HCL 5 MG PO TABS: 5.0000 mg | ORAL_TABLET | Freq: Once | ORAL | Status: DC | PRN

## 2014-12-02 MED ORDER — GLYCOPYRROLATE 0.2 MG/ML IJ SOLN
INTRAMUSCULAR | Status: DC | PRN
  Administered 2014-12-02: 0.6 mg via INTRAVENOUS

## 2014-12-02 MED ORDER — LIDOCAINE HCL (CARDIAC) 20 MG/ML IV SOLN
INTRAVENOUS | Status: DC | PRN
  Administered 2014-12-02: 60 mg via INTRAVENOUS

## 2014-12-02 MED ORDER — LACTATED RINGERS IV SOLN
INTRAVENOUS | Status: DC
Start: 2014-12-02 — End: 2014-12-03
  Administered 2014-12-02: 50 mL/h via INTRAVENOUS
  Administered 2014-12-02: 22:00:00 via INTRAVENOUS

## 2014-12-02 MED ORDER — ONDANSETRON HCL 4 MG/2ML IJ SOLN
4.0000 mg | Freq: Once | INTRAMUSCULAR | Status: AC
  Administered 2014-12-02: 4 mg via INTRAVENOUS
  Filled 2014-12-02: qty 2

## 2014-12-02 MED ORDER — PROPOFOL 10 MG/ML IV BOLUS
INTRAVENOUS | Status: AC
  Filled 2014-12-02: qty 20

## 2014-12-02 MED ORDER — HYDROMORPHONE HCL 1 MG/ML IJ SOLN: 0.2500 mg | INTRAMUSCULAR | Status: DC | PRN

## 2014-12-02 MED ORDER — SUCCINYLCHOLINE CHLORIDE 20 MG/ML IJ SOLN
INTRAMUSCULAR | Status: AC
  Filled 2014-12-02: qty 1

## 2014-12-02 MED ORDER — FENTANYL CITRATE 0.05 MG/ML IJ SOLN
INTRAMUSCULAR | Status: AC
  Filled 2014-12-02: qty 2

## 2014-12-02 MED ORDER — PROPOFOL 10 MG/ML IV BOLUS
INTRAVENOUS | Status: DC | PRN
  Administered 2014-12-02: 150 mg via INTRAVENOUS

## 2014-12-02 MED ORDER — MIDAZOLAM HCL 2 MG/2ML IJ SOLN
INTRAMUSCULAR | Status: AC
  Filled 2014-12-02: qty 2

## 2014-12-02 MED ORDER — THYROID 60 MG PO TABS
180.0000 mg | ORAL_TABLET | Freq: Every day | ORAL | Status: DC
  Administered 2014-12-02 – 2014-12-03 (×2): 180 mg via ORAL
  Filled 2014-12-02 (×2): qty 1

## 2014-12-02 MED ORDER — SODIUM CHLORIDE 0.9 % IV SOLN
1000.0000 mL | INTRAVENOUS | Status: DC
Start: 2014-12-02 — End: 2014-12-02
  Administered 2014-12-02: 1000 mL via INTRAVENOUS

## 2014-12-02 MED ORDER — ONDANSETRON HCL 4 MG/2ML IJ SOLN: 4.0000 mg | Freq: Four times a day (QID) | INTRAMUSCULAR | Status: DC | PRN

## 2014-12-02 MED ORDER — SUCCINYLCHOLINE CHLORIDE 20 MG/ML IJ SOLN
INTRAMUSCULAR | Status: DC | PRN
  Administered 2014-12-02: 120 mg via INTRAVENOUS

## 2014-12-02 MED ORDER — SODIUM CHLORIDE 0.9 % IV SOLN
1000.0000 mL | Freq: Once | INTRAVENOUS | Status: AC
  Administered 2014-12-02: 1000 mL via INTRAVENOUS

## 2014-12-02 MED ORDER — SODIUM CHLORIDE 0.9 % IR SOLN
Status: DC | PRN
  Administered 2014-12-02: 1000 mL

## 2014-12-02 MED ORDER — SCOPOLAMINE 1 MG/3DAYS TD PT72
1.0000 | MEDICATED_PATCH | TRANSDERMAL | Status: DC
  Administered 2014-12-02: 1.5 mg via TRANSDERMAL

## 2014-12-02 MED ORDER — GLYCOPYRROLATE 0.2 MG/ML IJ SOLN
INTRAMUSCULAR | Status: AC
  Filled 2014-12-02: qty 3

## 2014-12-02 MED ORDER — PROMETHAZINE HCL 25 MG/ML IJ SOLN: 6.2500 mg | INTRAMUSCULAR | Status: DC | PRN

## 2014-12-02 MED ORDER — FENTANYL CITRATE 0.05 MG/ML IJ SOLN
INTRAMUSCULAR | Status: AC
  Filled 2014-12-02: qty 5

## 2014-12-02 MED ORDER — BUPIVACAINE HCL 0.25 % IJ SOLN
INTRAMUSCULAR | Status: DC | PRN
  Administered 2014-12-02: 30 mL

## 2014-12-02 MED ORDER — NEOSTIGMINE METHYLSULFATE 10 MG/10ML IV SOLN
INTRAVENOUS | Status: AC
Start: 2014-12-02 — End: 2014-12-02
  Filled 2014-12-02: qty 1

## 2014-12-02 MED ORDER — ZOLPIDEM TARTRATE 5 MG PO TABS: 5.0000 mg | ORAL_TABLET | Freq: Every evening | ORAL | Status: DC | PRN

## 2014-12-02 MED ORDER — ROCURONIUM BROMIDE 100 MG/10ML IV SOLN
INTRAVENOUS | Status: DC | PRN
  Administered 2014-12-02: 20 mg via INTRAVENOUS

## 2014-12-02 MED ORDER — NEOSTIGMINE METHYLSULFATE 10 MG/10ML IV SOLN
INTRAVENOUS | Status: DC | PRN
Start: 2014-12-02 — End: 2014-12-02
  Administered 2014-12-02: 4 mg via INTRAVENOUS

## 2014-12-02 MED ORDER — MIDAZOLAM HCL 5 MG/5ML IJ SOLN
INTRAMUSCULAR | Status: DC | PRN
  Administered 2014-12-02: 2 mg via INTRAVENOUS

## 2014-12-02 MED ORDER — BUDESONIDE-FORMOTEROL FUMARATE 160-4.5 MCG/ACT IN AERO
2.0000 | INHALATION_SPRAY | Freq: Two times a day (BID) | RESPIRATORY_TRACT | Status: DC
  Filled 2014-12-02: qty 6

## 2014-12-02 MED ORDER — MORPHINE SULFATE 2 MG/ML IJ SOLN
2.0000 mg | INTRAMUSCULAR | Status: DC | PRN
  Administered 2014-12-02 – 2014-12-03 (×3): 2 mg via INTRAVENOUS
  Filled 2014-12-02 (×3): qty 1

## 2014-12-02 MED ORDER — DEXAMETHASONE SODIUM PHOSPHATE 10 MG/ML IJ SOLN
INTRAMUSCULAR | Status: DC | PRN
  Administered 2014-12-02: 8 mg via INTRAVENOUS

## 2014-12-02 MED ORDER — LACTATED RINGERS IV SOLN
INTRAVENOUS | Status: DC | PRN
  Administered 2014-12-02 (×2): via INTRAVENOUS

## 2014-12-02 MED ORDER — IOHEXOL 300 MG/ML  SOLN
100.0000 mL | Freq: Once | INTRAMUSCULAR | Status: AC | PRN
  Administered 2014-12-02: 100 mL via INTRAVENOUS

## 2014-12-02 MED ORDER — ALPRAZOLAM 0.5 MG PO TABS: 0.5000 mg | ORAL_TABLET | Freq: Every day | ORAL | Status: DC | PRN

## 2014-12-02 MED ORDER — ENOXAPARIN SODIUM 40 MG/0.4ML ~~LOC~~ SOLN
40.0000 mg | SUBCUTANEOUS | Status: DC
  Administered 2014-12-02: 40 mg via SUBCUTANEOUS
  Filled 2014-12-02 (×2): qty 0.4

## 2014-12-02 MED ORDER — OXYCODONE HCL 5 MG PO TABS
5.0000 mg | ORAL_TABLET | ORAL | Status: DC | PRN
  Administered 2014-12-02 (×2): 5 mg via ORAL
  Filled 2014-12-02 (×2): qty 1

## 2014-12-02 MED ORDER — FENTANYL CITRATE 0.05 MG/ML IJ SOLN
INTRAMUSCULAR | Status: DC | PRN
  Administered 2014-12-02: 50 ug via INTRAVENOUS
  Administered 2014-12-02: 100 ug via INTRAVENOUS
  Administered 2014-12-02: 25 ug via INTRAVENOUS
  Administered 2014-12-02 (×2): 50 ug via INTRAVENOUS
  Administered 2014-12-02: 25 ug via INTRAVENOUS

## 2014-12-02 MED ORDER — OXYCODONE HCL 5 MG/5ML PO SOLN: 5.0000 mg | Freq: Once | ORAL | Status: DC | PRN

## 2014-12-02 MED ORDER — ROCURONIUM BROMIDE 50 MG/5ML IV SOLN
INTRAVENOUS | Status: AC
  Filled 2014-12-02: qty 1

## 2014-12-02 MED ORDER — IOHEXOL 300 MG/ML  SOLN
25.0000 mL | INTRAMUSCULAR | Status: DC
  Administered 2014-12-02: 25 mL via ORAL

## 2014-12-02 MED ORDER — ONDANSETRON HCL 4 MG/2ML IJ SOLN
INTRAMUSCULAR | Status: DC | PRN
  Administered 2014-12-02: 4 mg via INTRAVENOUS

## 2014-12-02 SURGICAL SUPPLY — 43 items
APL SKNCLS STERI-STRIP NONHPOA (GAUZE/BANDAGES/DRESSINGS) ×1
BENZOIN TINCTURE PRP APPL 2/3 (GAUZE/BANDAGES/DRESSINGS) ×3 IMPLANT
CANISTER SUCTION 2500CC (MISCELLANEOUS) ×3 IMPLANT
CHLORAPREP W/TINT 26ML (MISCELLANEOUS) ×3 IMPLANT
CLOSURE STERI-STRIP 1/2X4 (GAUZE/BANDAGES/DRESSINGS) ×1
CLSR STERI-STRIP ANTIMIC 1/2X4 (GAUZE/BANDAGES/DRESSINGS) ×1 IMPLANT
COVER SURGICAL LIGHT HANDLE (MISCELLANEOUS) ×3 IMPLANT
COVER TRANSDUCER ULTRASND (DRAPES) ×3 IMPLANT
DEVICE TROCAR PUNCTURE CLOSURE (ENDOMECHANICALS) ×3 IMPLANT
DRAPE LAPAROSCOPIC ABDOMINAL (DRAPES) ×3 IMPLANT
ELECT REM PT RETURN 9FT ADLT (ELECTROSURGICAL) ×3
ELECTRODE REM PT RTRN 9FT ADLT (ELECTROSURGICAL) ×1 IMPLANT
ENDOLOOP SUT PDS II  0 18 (SUTURE) ×6
ENDOLOOP SUT PDS II 0 18 (SUTURE) ×3 IMPLANT
GAUZE SPONGE 2X2 8PLY STRL LF (GAUZE/BANDAGES/DRESSINGS) IMPLANT
GLOVE BIO SURGEON STRL SZ7.5 (GLOVE) ×3 IMPLANT
GLOVE BIOGEL PI IND STRL 7.0 (GLOVE) IMPLANT
GLOVE BIOGEL PI INDICATOR 7.0 (GLOVE) ×2
GLOVE SURG SS PI 7.0 STRL IVOR (GLOVE) ×2 IMPLANT
GOWN STRL REUS W/ TWL LRG LVL3 (GOWN DISPOSABLE) ×2 IMPLANT
GOWN STRL REUS W/ TWL XL LVL3 (GOWN DISPOSABLE) ×1 IMPLANT
GOWN STRL REUS W/TWL LRG LVL3 (GOWN DISPOSABLE) ×6
GOWN STRL REUS W/TWL XL LVL3 (GOWN DISPOSABLE) ×3
KIT BASIN OR (CUSTOM PROCEDURE TRAY) ×3 IMPLANT
KIT ROOM TURNOVER OR (KITS) ×3 IMPLANT
NDL INSUFFLATION 14GA 120MM (NEEDLE) ×1 IMPLANT
NEEDLE INSUFFLATION 14GA 120MM (NEEDLE) ×3 IMPLANT
NS IRRIG 1000ML POUR BTL (IV SOLUTION) ×3 IMPLANT
PAD ARMBOARD 7.5X6 YLW CONV (MISCELLANEOUS) ×6 IMPLANT
SCISSORS LAP 5X35 DISP (ENDOMECHANICALS) ×3 IMPLANT
SET IRRIG TUBING LAPAROSCOPIC (IRRIGATION / IRRIGATOR) ×3 IMPLANT
SLEEVE ENDOPATH XCEL 5M (ENDOMECHANICALS) ×3 IMPLANT
SPECIMEN JAR SMALL (MISCELLANEOUS) ×3 IMPLANT
SPONGE GAUZE 2X2 STER 10/PKG (GAUZE/BANDAGES/DRESSINGS) ×2
SUT MNCRL AB 3-0 PS2 18 (SUTURE) ×3 IMPLANT
TAPE CLOTH SOFT 2X10 (GAUZE/BANDAGES/DRESSINGS) ×2 IMPLANT
TOWEL OR 17X24 6PK STRL BLUE (TOWEL DISPOSABLE) ×3 IMPLANT
TOWEL OR 17X26 10 PK STRL BLUE (TOWEL DISPOSABLE) ×3 IMPLANT
TRAY FOLEY CATH 16FR SILVER (SET/KITS/TRAYS/PACK) ×3 IMPLANT
TRAY LAPAROSCOPIC (CUSTOM PROCEDURE TRAY) ×3 IMPLANT
TROCAR XCEL NON-BLD 11X100MML (ENDOMECHANICALS) ×3 IMPLANT
TROCAR XCEL NON-BLD 5MMX100MML (ENDOMECHANICALS) ×3 IMPLANT
TUBING INSUFFLATION (TUBING) ×3 IMPLANT

## 2014-12-02 NOTE — Anesthesia Postprocedure Evaluation (Signed)
  Anesthesia Post-op Note  Patient: Stephanie Schwartz  Procedure(s) Performed: Procedure(s): APPENDECTOMY LAPAROSCOPIC (N/A)  Patient Location: PACU  Anesthesia Type:General  Level of Consciousness: awake and alert   Airway and Oxygen Therapy: Patient Spontanous Breathing  Post-op Pain: none  Post-op Assessment: Post-op Vital signs reviewed  Post-op Vital Signs: Reviewed  Last Vitals:  Filed Vitals:   12/02/14 1412  BP: 112/52  Pulse: 85  Temp:   Resp: 17    Complications: No apparent anesthesia complications

## 2014-12-02 NOTE — ED Provider Notes (Signed)
08:50- I received a call from radiology, regarding her CT.  Patient has perforated appendicitis and a liver mass.  At this time; the patient is comfortable.  Complaining of a mild chill.  No additional complaints.  Findings discussed with patient, all questions answered.  Call placed to Gen. surgery to see and admit the patient and schedule for operative repair of appendicitis with perforation.  Richarda Blade, MD 12/02/14 [phone]

## 2014-12-02 NOTE — ED Notes (Signed)
Stephanie Schwartz at bedside

## 2014-12-02 NOTE — ED Provider Notes (Signed)
CSN: [id number]     Arrival date & time 12/02/14  0334 History   First MD Initiated Contact with Patient 12/02/14 [phone]     Chief Complaint  Patient presents with  . Abdominal Pain    right lower quad/more pelvic than abdominal     (Consider location/radiation/quality/duration/timing/severity/associated sxs/prior Treatment) Patient is a 49 y.o. female presenting with abdominal pain. The history is provided by the patient.  Abdominal Pain She had onset at about 7 PM of pain in the right lower abdomen which is become progressively worse. Pain is dull and she rates it at 8/10. There is no associated nausea or vomiting. She has noted some mild to moderate abdominal bloating. She did have some mild constipation yesterday. She denies fever or chills but she denies any urinary difficulty. Pain was worse when she was being wheeled into the room and she went over a threshold. Last menses was about 2.5 weeks ago and was normal. She denies any unprotected heterosexual sex.  Past Medical History  Diagnosis Date  . SHINGLES 06/07/2008  . HYPOTHYROIDISM 08/21/2007  . HYPERCHOLESTEROLEMIA 08/21/2007  . HYPERLIPIDEMIA 03/01/2008  . OTITIS MEDIA, SEROUS, ACUTE, LEFT 03/15/2008  . ASTHMATIC BRONCHITIS, ACUTE 10/14/2008  . ALLERGIC RHINITIS 03/01/2008  . ASTHMA 10/14/2008  . Wheezing 10/14/2009  . Anxiety 02/25/2012   Past Surgical History  Procedure Laterality Date  . Shoulder surgery    . Breast surgery    . Breast reduction surgery     Family History  Problem Relation Age of Onset  . Alcohol abuse Other     ETOH  . Cancer Other     Breast Cancer  . Hypertension Other   . Hyperlipidemia Other   . Diabetes Other    History  Substance Use Topics  . Smoking status: Never Smoker   . Smokeless tobacco: Never Used  . Alcohol Use: 0.6 oz/week    1 Glasses of wine per week     Comment: daily   OB History    No data available     Review of Systems  Gastrointestinal: Positive for abdominal pain.   All other systems reviewed and are negative.     Allergies  Review of patient's allergies indicates no known allergies.  Home Medications   Prior to Admission medications   Medication Sig Start Date End Date Taking? Authorizing Provider  ALPRAZolam Duanne Moron) 0.5 MG tablet Take 1 tablet (0.5 mg total) by mouth daily as needed for anxiety. 04/05/14   Biagio Borg, MD  loratadine-pseudoephedrine (CLARITIN-D 24-HOUR) 10-240 MG per 24 hr tablet Take 1 tablet by mouth daily.      Historical Provider, MD  SYMBICORT 160-4.5 MCG/ACT inhaler INHALE 2 PUFFS INTO THE LUNGS 2 (TWO) TIMES DAILY. 08/10/13   Biagio Borg, MD  SYMBICORT 160-4.5 MCG/ACT inhaler INHALE 2 PUFFS INTO THE LUNGS 2 (TWO) TIMES DAILY. 08/13/14   Biagio Borg, MD  thyroid [facility] THYROID) 180 MG tablet Take 1 tablet (180 mg total) by mouth daily. 02/27/12 02/26/13  Biagio Borg, MD  zaleplon (SONATA) 5 MG capsule Take 1 capsule (5 mg total) by mouth at bedtime as needed for sleep. 04/05/14   Biagio Borg, MD   BP 131/68 mmHg  Pulse 85  Temp(Src) 97.9 F (36.6 C) (Oral)  Resp 17  Ht 5\' 9"  (1.753 m)  Wt 175 lb (79.379 kg)  BMI 25.83 kg/m2  SpO2 99%  LMP 11/18/2014 (Approximate) Physical Exam  Nursing note and vitals reviewed.  48 year  old female, resting comfortably and in no acute distress. Vital signs are normal. Oxygen saturation is 99%, which is normal. Head is normocephalic and atraumatic. PERRLA, EOMI. Oropharynx is clear. Neck is nontender and supple without adenopathy or JVD. Back is nontender and there is no CVA tenderness. Lungs are clear without rales, wheezes, or rhonchi. Chest is nontender. Heart has regular rate and rhythm without murmur. Abdomen is soft, flat, with generalized tenderness which is worst in the right lower quadrant. There is some voluntary guarding but no rebound. There is tenderness to percussion in the right lower quadrant. There are no masses or hepatosplenomegaly and peristalsis is  hypoactive. Extremities have no cyanosis or edema, full range of motion is present. Skin is warm and dry without rash. Neurologic: Mental status is normal, cranial nerves are intact, there are no motor or sensory deficits.  ED Course  Procedures (including critical care time) Labs Review Results for orders placed or performed during the hospital encounter of 12/02/14  Comprehensive metabolic panel  Result Value Ref Range   Sodium 136 135 - 145 mmol/L   Potassium 4.1 3.5 - 5.1 mmol/L   Chloride 107 96 - 112 mmol/L   CO2 26 19 - 32 mmol/L   Glucose, Bld 104 (H) 70 - 99 mg/dL   BUN 11 6 - 23 mg/dL   Creatinine, Ser 0.76 0.50 - 1.10 mg/dL   Calcium 8.3 (L) 8.4 - 10.5 mg/dL   Total Protein 5.5 (L) 6.0 - 8.3 g/dL   Albumin 3.2 (L) 3.5 - 5.2 g/dL   AST 28 0 - 37 U/L   ALT 26 0 - 35 U/L   Alkaline Phosphatase 62 39 - 117 U/L   Total Bilirubin 0.8 0.3 - 1.2 mg/dL   GFR calc non Af Amer >90 >90 mL/min   GFR calc Af Amer >90 >90 mL/min   Anion gap 3 (L) 5 - 15  Lipase, blood  Result Value Ref Range   Lipase 33 11 - 59 U/L  CBC with Differential  Result Value Ref Range   WBC 11.6 (H) 4.0 - 10.5 K/uL   RBC 3.65 (L) 3.87 - 5.11 MIL/uL   Hemoglobin 11.7 (L) 12.0 - 15.0 g/dL   HCT 34.9 (L) 36.0 - 46.0 %   MCV 95.6 78.0 - 100.0 fL   MCH 32.1 26.0 - 34.0 pg   MCHC 33.5 30.0 - 36.0 g/dL   RDW 11.8 11.5 - 15.5 %   Platelets 267 150 - 400 K/uL   Neutrophils Relative % 80 (H) 43 - 77 %   Neutro Abs 9.2 (H) 1.7 - 7.7 K/uL   Lymphocytes Relative 8 (L) 12 - 46 %   Lymphs Abs 0.9 0.7 - 4.0 K/uL   Monocytes Relative 10 3 - 12 %   Monocytes Absolute 1.2 (H) 0.1 - 1.0 K/uL   Eosinophils Relative 2 0 - 5 %   Eosinophils Absolute 0.3 0.0 - 0.7 K/uL   Basophils Relative 0 0 - 1 %   Basophils Absolute 0.0 0.0 - 0.1 K/uL  Urinalysis, Routine w reflex microscopic  Result Value Ref Range   Color, Urine YELLOW YELLOW   APPearance CLOUDY (A) CLEAR   Specific Gravity, Urine 1.025 1.005 - 1.030   pH  7.0 5.0 - 8.0   Glucose, UA NEGATIVE NEGATIVE mg/dL   Hgb urine dipstick NEGATIVE NEGATIVE   Bilirubin Urine NEGATIVE NEGATIVE   Ketones, ur NEGATIVE NEGATIVE mg/dL   Protein, ur NEGATIVE NEGATIVE mg/dL   Urobilinogen,  UA 0.2 0.0 - 1.0 mg/dL   Nitrite NEGATIVE NEGATIVE   Leukocytes, UA SMALL (A) NEGATIVE  Pregnancy, urine  Result Value Ref Range   Preg Test, Ur NEGATIVE NEGATIVE  Urine microscopic-add on  Result Value Ref Range   Squamous Epithelial / LPF FEW (A) RARE   WBC, UA 3-6 <3 WBC/hpf   RBC / HPF 0-2 <3 RBC/hpf   Bacteria, UA FEW (A) RARE   MDM   Final diagnoses:  Abdominal pain, unspecified abdominal location  RLQ abdominal pain    Right lower quadrant pain worrisome for possible appendicitis. She will be sent for CT scan.  Laboratory workup does show leukocytosis with left shift but urinalysis does not show evidence of ketones. CT scan is still pending at this point. She had good relief of pain with hydromorphone but pain recurred requiring repeat dosing of hydromorphone. Case is signed out to Dr. Eulis Foster to review results of CT scan.    Delora Fuel, MD 74/25/95 [id number]

## 2014-12-02 NOTE — Anesthesia Procedure Notes (Addendum)
Procedure Name: Intubation Date/Time: 12/02/2014 12:01 PM Performed by: Maeola Harman Pre-anesthesia Checklist: Patient identified, Emergency Drugs available, Suction available, Patient being monitored and Timeout performed Patient Re-evaluated:Patient Re-evaluated prior to inductionOxygen Delivery Method: Circle system utilized Preoxygenation: Pre-oxygenation with 100% oxygen Intubation Type: IV induction Laryngoscope Size: Mac and 3 Grade View: Grade I Tube type: Oral Tube size: 7.5 mm Number of attempts: 1 Airway Equipment and Method: Stylet Placement Confirmation: ETT inserted through vocal cords under direct vision,  positive ETCO2 and breath sounds checked- equal and bilateral Secured at: 22 cm Tube secured with: Tape Dental Injury: Teeth and Oropharynx as per pre-operative assessment  Comments: Easy atraumatic induction and intubation with MAC 3 blade.  Dr. Ola Spurr verified placement.  Waldron Session, CRNA

## 2014-12-02 NOTE — Anesthesia Preprocedure Evaluation (Addendum)
Anesthesia Evaluation  Patient identified by MRN, date of birth, ID band Patient awake    Reviewed: Allergy & Precautions, NPO status , Patient's Chart, lab work & pertinent test results  Airway Mallampati: II  TM Distance: >3 FB Neck ROM: Full    Dental  (+) Teeth Intact, Dental Advisory Given   Pulmonary asthma ,  breath sounds clear to auscultation        Cardiovascular negative cardio ROS      Neuro/Psych Anxiety negative neurological ROS     GI/Hepatic negative GI ROS, Neg liver ROS,   Endo/Other  Hypothyroidism   Renal/GU negative Renal ROS  negative genitourinary   Musculoskeletal negative musculoskeletal ROS (+)   Abdominal (+)  Abdomen: soft. Bowel sounds: normal.  Peds negative pediatric ROS (+)  Hematology  (+) anemia ,   Anesthesia Other Findings   Reproductive/Obstetrics                          Anesthesia Physical Anesthesia Plan  ASA: II and emergent  Anesthesia Plan: General   Post-op Pain Management:    Induction: Intravenous  Airway Management Planned: Oral ETT  Additional Equipment:   Intra-op Plan:   Post-operative Plan: Extubation in OR  Informed Consent: I have reviewed the patients History and Physical, chart, labs and discussed the procedure including the risks, benefits and alternatives for the proposed anesthesia with the patient or authorized representative who has indicated his/her understanding and acceptance.   Dental advisory given  Plan Discussed with: CRNA  Anesthesia Plan Comments:         Anesthesia Quick Evaluation

## 2014-12-02 NOTE — Op Note (Signed)
12/02/2014  12:50 PM  PATIENT:  Stephanie Schwartz  49 y.o. female  PRE-OPERATIVE DIAGNOSIS:  Acute Appendicitis  POST-OPERATIVE DIAGNOSIS:  Perforated Appendicitis   PROCEDURE:  Procedure(s): APPENDECTOMY LAPAROSCOPIC (N/A)  SURGEON:  Surgeon(s) and Role:    * Ralene Ok, MD - Primary  ANESTHESIA:   local and general  EBL:  Total I/O In: 1000 [I.V.:1000] Out: 200 [Urine:200]  BLOOD ADMINISTERED:none  DRAINS: none   LOCAL MEDICATIONS USED:  BUPIVICAINE   SPECIMEN:  Source of Specimen:  appendix  DISPOSITION OF SPECIMEN:  PATHOLOGY  COUNTS:  YES  TOURNIQUET:  * No tourniquets in log *  DICTATION: .Dragon Dictation Findings:  The patient had a acutely inflamed perforated appendix with a contained perforation  Indications for procedure:  The patient is a 49 year old female with a history of periumbilical pain localized in the right lower quadrant patient had a CT scan which revealed signs consistent with acute appendicitis the patient back in for laparoscopic appendectomy.  Details of the procedure:The patient was taken back to the operating room. The patient was placed in supine position with bilateral SCDs in place. The patient was prepped and draped in the usual sterile fashion.  After appropriate anitbiotics were confirmed, a time-out was confirmed and all facts were verified.  A pneumoperitoneum of 14 mmHg was obtained via a Veress needle technique in the left lower quadrant quadrant.  A 5 mm trocar and 5 mm camera then placed intra-abdominally there is no injury to any intra-abdominal organs a 10 mm infraumbilical port was placed and direct visualization as was a 5 mm port in the suprapubic area. The appendix was identified  The appendix identified and cleaned down to the appendiceal base. The appendiceal artery was taken with Bovie cautery maintaining hemostasis, the mesoappendix was then incised.  In dissecting the mesoappendix there appeared to be some loculation of  purulence that was freed up. This was immediately suctioned and irrigated up. There was minimal spillage. The the appendiceal base was clean.  At this time an Endoloop was placed proximallyx2 and one distally and the appendix was transected between these 2. Then a retrieval bag was then placed into the abdomen and the specimen placed in the bag. The appendiceal stump was cauterized. We evacuate the fluid from the pelvis until the effluent was clear. The omentum was brought over the appendiceal stump. The appendix a latex retrieval  bag was then retrieved via the supraumbilical port. #1 Vicryl was used to reapproximate the fascia at the umbilical port site x2. The skin was reapproximated all port sites 3-0 Monocryl subcuticular fashion. The skin was dressed with Steri-Strips gauze and tape. The patient was awakened from general anesthesia was taken to recovery room in stable condition.       PLAN OF CARE: Admit for overnight observation  PATIENT DISPOSITION:  PACU - hemodynamically stable.   Delay start of Pharmacological VTE agent (>24hrs) due to surgical blood loss or risk of bleeding: not applicable

## 2014-12-02 NOTE — ED Notes (Signed)
States my stomach was a little weird and bloated before dinner last night.  Was able to have a small bowel movement then pain started.  Pain progressively worse since.  Pain in right lower abdomen.

## 2014-12-02 NOTE — H&P (Signed)
Chief Complaint: RLQ abdominal pain HPI: Stephanie Schwartz is a 49 year old female with a history of asthma presenting with RLQ abdominal pain.  Onset was sudden last night at [facility].  It was preceded by bloating and constipation.  Coarse is unchanged.  Time pattern is constant.  Characterized as dull aching pain.  Aggravated by movement.  Alleviated by pain medication.  Modifying factors include; ibuprofen.  She denies nausea, vomiting or diarrhea.  Denies anorexia.  Denies fevers or chills.  Last oral intake was 7PM last night.    Past Medical History  Diagnosis Date  . SHINGLES 06/07/2008  . HYPOTHYROIDISM 08/21/2007  . HYPERCHOLESTEROLEMIA 08/21/2007  . HYPERLIPIDEMIA 03/01/2008  . OTITIS MEDIA, SEROUS, ACUTE, LEFT 03/15/2008  . ASTHMATIC BRONCHITIS, ACUTE 10/14/2008  . ALLERGIC RHINITIS 03/01/2008  . ASTHMA 10/14/2008  . Wheezing 10/14/2009  . Anxiety 02/25/2012    Past Surgical History  Procedure Laterality Date  . Shoulder surgery    . Breast surgery    . Breast reduction surgery      Family History  Problem Relation Age of Onset  . Alcohol abuse Other     ETOH  . Cancer Other     Breast Cancer  . Hypertension Other   . Hyperlipidemia Other   . Diabetes Other    Social History:  reports that she has never smoked. She has never used smokeless tobacco. She reports that she drinks about 0.6 oz of alcohol per week. She reports that she does not use illicit drugs.  Allergies: No Known Allergies   Medication Prior to Admission medications   Medication Sig Start Date End Date Taking? Authorizing Provider  loratadine-pseudoephedrine (CLARITIN-D 24-HOUR) 10-240 MG per 24 hr tablet Take 1 tablet by mouth daily.     Yes Historical Provider, MD  SYMBICORT 160-4.5 MCG/ACT inhaler INHALE 2 PUFFS INTO THE LUNGS 2 (TWO) TIMES DAILY. 08/10/13  Yes Biagio Borg, MD  thyroid (ARMOUR) 180 MG tablet Take 180 mg by mouth daily.   Yes Historical Provider, MD  ALPRAZolam Duanne Moron) 0.5 MG tablet Take 1  tablet (0.5 mg total) by mouth daily as needed for anxiety. Patient not taking: Reported on 12/02/2014 04/05/14   Biagio Borg, MD  zaleplon (SONATA) 5 MG capsule Take 1 capsule (5 mg total) by mouth at bedtime as needed for sleep. Patient not taking: Reported on 12/02/2014 04/05/14   Biagio Borg, MD     (Not in a hospital admission)  Results for orders placed or performed during the hospital encounter of 12/02/14 (from the past 48 hour(s))  Comprehensive metabolic panel     Status: Abnormal   Collection Time: 12/02/14  4:30 AM  Result Value Ref Range   Sodium 136 135 - 145 mmol/L   Potassium 4.1 3.5 - 5.1 mmol/L   Chloride 107 96 - 112 mmol/L   CO2 26 19 - 32 mmol/L   Glucose, Bld 104 (H) 70 - 99 mg/dL   BUN 11 6 - 23 mg/dL   Creatinine, Ser 0.76 0.50 - 1.10 mg/dL   Calcium 8.3 (L) 8.4 - 10.5 mg/dL   Total Protein 5.5 (L) 6.0 - 8.3 g/dL   Albumin 3.2 (L) 3.5 - 5.2 g/dL   AST 28 0 - 37 U/L   ALT 26 0 - 35 U/L   Alkaline Phosphatase 62 39 - 117 U/L   Total Bilirubin 0.8 0.3 - 1.2 mg/dL   GFR calc non Af Amer >90 >90 mL/min   GFR calc Af  Amer >90 >90 mL/min    Comment: (NOTE) The eGFR has been calculated using the CKD EPI equation. This calculation has not been validated in all clinical situations. eGFR's persistently <90 mL/min signify possible Chronic Kidney Disease.    Anion gap 3 (L) 5 - 15  Lipase, blood     Status: None   Collection Time: 12/02/14  4:30 AM  Result Value Ref Range   Lipase 33 11 - 59 U/L  CBC with Differential     Status: Abnormal   Collection Time: 12/02/14  4:30 AM  Result Value Ref Range   WBC 11.6 (H) 4.0 - 10.5 K/uL   RBC 3.65 (L) 3.87 - 5.11 MIL/uL   Hemoglobin 11.7 (L) 12.0 - 15.0 g/dL   HCT 34.9 (L) 36.0 - 46.0 %   MCV 95.6 78.0 - 100.0 fL   MCH 32.1 26.0 - 34.0 pg   MCHC 33.5 30.0 - 36.0 g/dL   RDW 11.8 11.5 - 15.5 %   Platelets 267 150 - 400 K/uL   Neutrophils Relative % 80 (H) 43 - 77 %   Neutro Abs 9.2 (H) 1.7 - 7.7 K/uL   Lymphocytes  Relative 8 (L) 12 - 46 %   Lymphs Abs 0.9 0.7 - 4.0 K/uL   Monocytes Relative 10 3 - 12 %   Monocytes Absolute 1.2 (H) 0.1 - 1.0 K/uL   Eosinophils Relative 2 0 - 5 %   Eosinophils Absolute 0.3 0.0 - 0.7 K/uL   Basophils Relative 0 0 - 1 %   Basophils Absolute 0.0 0.0 - 0.1 K/uL  Urinalysis, Routine w reflex microscopic     Status: Abnormal   Collection Time: 12/02/14  4:30 AM  Result Value Ref Range   Color, Urine YELLOW YELLOW   APPearance CLOUDY (A) CLEAR   Specific Gravity, Urine 1.025 1.005 - 1.030   pH 7.0 5.0 - 8.0   Glucose, UA NEGATIVE NEGATIVE mg/dL   Hgb urine dipstick NEGATIVE NEGATIVE   Bilirubin Urine NEGATIVE NEGATIVE   Ketones, ur NEGATIVE NEGATIVE mg/dL   Protein, ur NEGATIVE NEGATIVE mg/dL   Urobilinogen, UA 0.2 0.0 - 1.0 mg/dL   Nitrite NEGATIVE NEGATIVE   Leukocytes, UA SMALL (A) NEGATIVE  Pregnancy, urine     Status: None   Collection Time: 12/02/14  4:30 AM  Result Value Ref Range   Preg Test, Ur NEGATIVE NEGATIVE    Comment:        THE SENSITIVITY OF THIS METHODOLOGY IS >20 mIU/mL.   Urine microscopic-add on     Status: Abnormal   Collection Time: 12/02/14  4:30 AM  Result Value Ref Range   Squamous Epithelial / LPF FEW (A) RARE   WBC, UA 3-6 <3 WBC/hpf   RBC / HPF 0-2 <3 RBC/hpf   Bacteria, UA FEW (A) RARE   Ct Abdomen Pelvis W Contrast  12/02/2014   CLINICAL DATA:  Right lower quadrant pain.  EXAM: CT ABDOMEN AND PELVIS WITH CONTRAST  TECHNIQUE: Multidetector CT imaging of the abdomen and pelvis was performed using the standard protocol following bolus administration of intravenous contrast.  CONTRAST:  138m OMNIPAQUE IOHEXOL 300 MG/ML  SOLN  COMPARISON:  None.  FINDINGS: BODY WALL: Unremarkable.  LOWER CHEST: Subsegmental atelectasis in the lower lungs.  ABDOMEN/PELVIS:  Liver: 5 cm subcapsular liver mass in the anterior segment right Liver. The margins are lobulated and ill-defined with small areas of peripheral calcification. There is delayed  enhancement which is incomplete.  Biliary: No evidence of  biliary obstruction or stone.  Pancreas: Unremarkable.  Spleen: Unremarkable.  Adrenals: Unremarkable.  Kidneys and ureters: No hydronephrosis or stone.  Bladder: Unremarkable.  Reproductive: Probable hemorrhagic cyst/ follicle in the right ovary, 2.4 cm in diameter.  Bowel: The appendix is markedly dilated and contains fluid and debris. The surrounding fat is also infiltrated. The lateral wall of the appendix is indistinct and contiguous with localized peritoneal fluid in the right lower quadrant; there is minimal high-density debris is noted in the pelvis. No rim enhancing abscess identified. The appendix is in the expected anatomic location, and positioned right of the cecum.  Vascular: No acute abnormality.  OSSEOUS: No acute abnormalities.  Critical Value/emergent results were called by telephone at the time of interpretation on 12/02/2014 at 8:51 am to Dr. Eulis Foster, who verbally acknowledged these results.  IMPRESSION: 1. Advanced acute appendicitis. There is likely appendiceal perforation, but no nature abscess. 2. Indeterminate 5 cm liver mass. The appearance can be seen with atypical/hyalinzed hemangioma, but further evaluation is recommended with liver MRI.   Electronically Signed   By: Jorje Guild M.D.   On: 12/02/2014 08:53    Review of Systems  All other systems reviewed and are negative.   Blood pressure 119/54, pulse 87, temperature 99.5 F (37.5 C), temperature source Oral, resp. rate 17, height 5' 9" (1.753 m), weight 175 lb (79.379 kg), last menstrual period 11/18/2014, SpO2 99 %. Physical Exam  Constitutional: She is oriented to person, place, and time. She appears well-developed and well-nourished. No distress.  HENT:  Head: Atraumatic.  Neck: Normal range of motion. Neck supple.  Cardiovascular: Normal rate, regular rhythm, normal heart sounds and intact distal pulses.  Exam reveals no gallop and no friction rub.   No murmur  heard. Respiratory: Effort normal and breath sounds normal. No respiratory distress. She has no wheezes. She has no rales. She exhibits no tenderness.  GI: Soft. Bowel sounds are normal. She exhibits no distension. There is tenderness in the right lower quadrant. There is rebound.  Musculoskeletal: Normal range of motion. She exhibits no edema or tenderness.  Neurological: She is alert and oriented to person, place, and time.  Skin: Skin is warm and dry. No rash noted. She is not diaphoretic. No erythema. No pallor.  Psychiatric: She has a normal mood and affect. Her behavior is normal. Judgment and thought content normal.     Assessment/Plan Acute appendicitis, possibly perforated -proceed with laparoscopic appendectomy this AM -NPO -IVF -zosyn -SCD, post op lovenox -pain control  5cm liver mass -further work up on outpatient basis  RIEBOCK, EMINA ANP-BC 12/02/2014, 9:53 AM

## 2014-12-02 NOTE — Transfer of Care (Signed)
Immediate Anesthesia Transfer of Care Note  Patient: Stephanie Schwartz  Procedure(s) Performed: Procedure(s): APPENDECTOMY LAPAROSCOPIC (N/A)  Patient Location: PACU  Anesthesia Type:General  Level of Consciousness: awake, alert  and oriented  Airway & Oxygen Therapy: Patient connected to face mask oxygen  Post-op Assessment: Report given to RN  Post vital signs: stable  Last Vitals:  Filed Vitals:   12/02/14 1312  BP: 119/44  Pulse:   Temp: 36.9 C  Resp:     Complications: No apparent anesthesia complications

## 2014-12-03 ENCOUNTER — Encounter (HOSPITAL_COMMUNITY): Payer: Self-pay | Admitting: General Surgery

## 2014-12-03 MED ORDER — AMOXICILLIN-POT CLAVULANATE 875-125 MG PO TABS: 1.0000 | ORAL_TABLET | Freq: Two times a day (BID) | ORAL | Status: DC

## 2014-12-03 MED ORDER — OXYCODONE HCL 5 MG PO TABS: 5.0000 mg | ORAL_TABLET | ORAL | Status: DC | PRN

## 2014-12-03 MED ORDER — IBUPROFEN 600 MG PO TABS
600.0000 mg | ORAL_TABLET | Freq: Three times a day (TID) | ORAL | Status: DC | PRN
  Administered 2014-12-03: 600 mg via ORAL
  Filled 2014-12-03: qty 1

## 2014-12-03 NOTE — Discharge Summary (Signed)
Patient ID: Stephanie Schwartz MRN: [id number] DOB/AGE: [birth year] 50 y.o.  Admit date: 12/02/2014 Discharge date: 12/03/2014  Procedures: lap appy  Consults: None  Reason for Admission: Stephanie Schwartz is a 49 year old female with a history of asthma presenting with RLQ abdominal pain. Onset was sudden last night at [facility]. It was preceded by bloating and constipation. Coarse is unchanged. Time pattern is constant. Characterized as dull aching pain. Aggravated by movement. Alleviated by pain medication. Modifying factors include; ibuprofen. She denies nausea, vomiting or diarrhea. Denies anorexia. Denies fevers or chills. Last oral intake was 7PM last night.   Admission Diagnoses:  1. Acute appendicitis  Hospital Course: The patient was admitted and taken to the OR where she underwent the above procedure.  She was found to have a perforated appendicitis.  On POD 1, her pain was well controlled and she was tolerating a solid diet.  She was continued on her zosyn and will be switched to oral augmentin at discharge.  PE: Abd: soft, appropriately tender, +BS, ND, incisions c/di, umbilical gauze has some old drainage present  Discharge Diagnoses:  Active Problems:   Acute perforated appendicitis, s/p lap appy   Liver mass   Discharge Medications:   Medication List    STOP taking these medications        ALPRAZolam 0.5 MG tablet  Commonly known as:  XANAX     zaleplon 5 MG capsule  Commonly known as:  SONATA      TAKE these medications        amoxicillin-clavulanate 875-125 MG per tablet  Commonly known as:  AUGMENTIN  Take 1 tablet by mouth 2 (two) times daily.     loratadine-pseudoephedrine 10-240 MG per 24 hr tablet  Commonly known as:  CLARITIN-D 24-hour  Take 1 tablet by mouth daily.     oxyCODONE 5 MG immediate release tablet  Commonly known as:  Oxy IR/ROXICODONE  Take 1-2 tablets (5-10 mg total) by mouth every 4 (four) hours as needed for moderate pain.     SYMBICORT 160-4.5 MCG/ACT inhaler  Generic drug:  budesonide-formoterol  INHALE 2 PUFFS INTO THE LUNGS 2 (TWO) TIMES DAILY.     thyroid 180 MG tablet  Commonly known as:  ARMOUR  Take 180 mg by mouth daily.        Discharge Instructions:     Follow-up Information    Follow up with [facility] On 12/24/2014.   Why:  Doc of the Week clinic, 3:15pm, arrive no later than 2:45pm for paperwork   Contact information:   [address] [place] [address] [phone]     Follow up with PCP about liver mass, likely hemangioma    Signed: Ashlynn Gunnels E 12/03/2014, 8:41 AM

## 2014-12-03 NOTE — Discharge Instructions (Signed)
[facility] ______[facility] °LAPAROSCOPIC SURGERY: POST OP INSTRUCTIONS °Always review your discharge instruction sheet given to you by the facility where your surgery was performed. °IF YOU HAVE DISABILITY OR FAMILY LEAVE FORMS, YOU MUST BRING THEM TO THE OFFICE FOR PROCESSING.   °DO NOT GIVE THEM TO YOUR DOCTOR. ° °1. A prescription for pain medication may be given to you upon discharge.  Take your pain medication as prescribed, if needed.  If narcotic pain medicine is not needed, then you may take acetaminophen (Tylenol) or ibuprofen (Advil) as needed. °2. Take your usually prescribed medications unless otherwise directed. °3. If you need a refill on your pain medication, please contact your pharmacy.  They will contact our office to request authorization. Prescriptions will not be filled after 5pm or on week-ends. °4. You should follow a light diet the first few days after arrival home, such as soup and crackers, etc.  Be sure to include lots of fluids daily. °5. Most patients will experience some swelling and bruising in the area of the incisions.  Ice packs will help.  Swelling and bruising can take several days to resolve.  °6. It is common to experience some constipation if taking pain medication after surgery.  Increasing fluid intake and taking a stool softener (such as Colace) will usually help or prevent this problem from occurring.  A mild laxative (Milk of Magnesia or Miralax) should be taken according to package instructions if there are no bowel movements after 48 hours. °7. Unless discharge instructions indicate otherwise, you may remove your bandages 24-48 hours after surgery, and you may shower at that time.  You may have steri-strips (small skin tapes) in place directly over the incision.  These strips should be left on the skin for 7-10 days.  If your surgeon used skin glue on the incision, you may shower in 24 hours.  The glue will flake off over the next 2-3 weeks.  Any sutures or  staples will be removed at the office during your follow-up visit. °8. ACTIVITIES:  You may resume regular (light) daily activities beginning the next day--such as daily self-care, walking, climbing stairs--gradually increasing activities as tolerated.  You may have sexual intercourse when it is comfortable.  Refrain from any heavy lifting or straining until approved by your doctor. °a. You may drive when you are no longer taking prescription pain medication, you can comfortably wear a seatbelt, and you can safely maneuver your car and apply brakes. °b. RETURN TO WORK:  __________________________________________________________ °9. You should see your doctor in the office for a follow-up appointment approximately 2-3 weeks after your surgery.  Make sure that you call for this appointment within a day or two after you arrive home to insure a convenient appointment time. °10. OTHER INSTRUCTIONS: __________________________________________________________________________________________________________________________ __________________________________________________________________________________________________________________________ °WHEN TO CALL YOUR DOCTOR: °1. Fever over 101.0 °2. Inability to urinate °3. Continued bleeding from incision. °4. Increased pain, redness, or drainage from the incision. °5. Increasing abdominal pain ° °The clinic staff is available to answer your questions during regular business hours.  Please don’t hesitate to call and ask to speak to one of the nurses for clinical concerns.  If you have a medical emergency, go to the nearest emergency room or call 911.  A surgeon from [facility] is always on call at the hospital. °[address], [address], [place], [place]  [address] ? [address] [address], [place],    [address] °[phone] ? [phone] ? FAX [phone] °Web site:   [email or web address]   Liver hemangioma (he-man-jee-O-muh) is a noncancerous  (benign) mass that occurs in the liver. A liver hemangioma is made up of a tangle of blood vessels. Liver hemangioma is sometimes called hepatic hemangioma or cavernous hemangioma.  Most cases of liver hemangioma are discovered during a test or procedure for some other condition. Most people who have a liver hemangioma never experience signs and symptoms and don't need treatment.  It may be unsettling to know you have a mass in your liver, even if it's a benign mass. There's no evidence that an untreated liver hemangioma can lead to liver cancer.

## 2014-12-03 NOTE — Progress Notes (Signed)
Patient discharged to home with instructions. 

## 2014-12-12 ENCOUNTER — Ambulatory Visit: Payer: BLUE CROSS/BLUE SHIELD | Admitting: Internal Medicine

## 2014-12-12 ENCOUNTER — Ambulatory Visit (INDEPENDENT_AMBULATORY_CARE_PROVIDER_SITE_OTHER): Payer: BLUE CROSS/BLUE SHIELD | Admitting: Internal Medicine

## 2014-12-12 ENCOUNTER — Encounter: Payer: Self-pay | Admitting: Internal Medicine

## 2014-12-12 VITALS — BP 114/78 | HR 68 | Temp 97.9°F | Ht 69.0 in | Wt 180.2 lb

## 2014-12-12 DIAGNOSIS — R160 Hepatomegaly, not elsewhere classified: Secondary | ICD-10-CM

## 2014-12-12 NOTE — Progress Notes (Signed)
Pre visit review using our clinic review tool, if applicable. No additional management support is needed unless otherwise documented below in the visit note. 

## 2014-12-12 NOTE — Progress Notes (Signed)
   Subjective:    Patient ID: Stephanie Schwartz, female    DOB: 1966/06/21, 49 y.o.   MRN: [id number]  HPI  Here to f/u, asks for MRI related to recent CT finding of 5 cm mass to liver, when had recent eval and tx for acute appendicitis.  Denies worsening reflux, abd pain, dysphagia, n/v, bowel change or blood. No other known malignancy, No other new complaints Past Medical History  Diagnosis Date  . SHINGLES 06/07/2008  . HYPERCHOLESTEROLEMIA 08/21/2007  . OTITIS MEDIA, SEROUS, ACUTE, LEFT 03/15/2008  . ASTHMATIC BRONCHITIS, ACUTE 10/14/2008  . ALLERGIC RHINITIS 03/01/2008  . Wheezing 10/14/2009  . Anxiety 02/25/2012  . Heart murmur     "maybe" (12/02/2014)  . ASTHMA     "allergy induced"  . HYPOTHYROIDISM    Past Surgical History  Procedure Laterality Date  . Shoulder arthroscopy Left ~ 2009  . Appendectomy  12/02/2014    lap  . Reduction mammaplasty Bilateral ~ 1995  . Laparoscopic appendectomy N/A 12/02/2014    Procedure: APPENDECTOMY LAPAROSCOPIC;  Surgeon: Ralene Ok, MD;  Location: [facility];  Service: General;  Laterality: N/A;    reports that she has never smoked. She has never used smokeless tobacco. She reports that she drinks about 8.4 oz of alcohol per week. She reports that she does not use illicit drugs. family history includes Alcohol abuse in her other; Cancer in her other; Diabetes in her other; Hyperlipidemia in her other; Hypertension in her other. No Known Allergies Current Outpatient Prescriptions on File Prior to Visit  Medication Sig Dispense Refill  . loratadine-pseudoephedrine (CLARITIN-D 24-HOUR) 10-240 MG per 24 hr tablet Take 1 tablet by mouth daily.      . SYMBICORT 160-4.5 MCG/ACT inhaler INHALE 2 PUFFS INTO THE LUNGS 2 (TWO) TIMES DAILY. 10.2 g 10  . thyroid (ARMOUR) 180 MG tablet Take 180 mg by mouth daily.     No current facility-administered medications on file prior to visit.   Review of Systems All otherwise neg per pt     Objective:   Physical  Exam BP 114/78 mmHg  Pulse 68  Temp(Src) 97.9 F (36.6 C) (Oral)  Ht 5\' 9"  (1.753 m)  Wt 180 lb 3.2 oz (81.738 kg)  BMI 26.60 kg/m2  LMP 11/18/2014 (Approximate) VS noted,  Constitutional: Pt appears well-developed, well-nourished.  HENT: Head: NCAT.  Right Ear: External ear normal.  Left Ear: External ear normal.  Eyes: . Pupils are equal, round, and reactive to light. Conjunctivae and EOM are normal Neck: Normal range of motion. Neck supple.  Cardiovascular: Normal rate and regular rhythm.   Pulmonary/Chest: Effort normal and breath sounds without rales or wheezing.  Abd:  Soft, NT, ND, + BS - no HSM Neurological: Pt is alert. Not confused , motor grossly intact Skin: Skin is warm. No rash Psychiatric: Pt behavior is normal. No agitation.     Assessment & Plan:

## 2014-12-12 NOTE — Patient Instructions (Signed)
Please continue all other medications as before, and refills have been done if requested.  Please have the pharmacy call with any other refills you may need.  Please keep your appointments with your specialists as you may have planned   You will be contacted regarding the referral for: MRI abdomen

## 2014-12-12 NOTE — Assessment & Plan Note (Signed)
D/w pt, ? Hemangioma, ok for MRI, large size is noted,  to f/u any worsening symptoms or concerns

## 2014-12-13 ENCOUNTER — Telehealth: Payer: Self-pay | Admitting: *Deleted

## 2014-12-13 ENCOUNTER — Emergency Department (HOSPITAL_COMMUNITY)
Admission: EM | Admit: 2014-12-13 | Discharge: 2014-12-13 | Disposition: A | Discharge status: Home / Self Care | Payer: BLUE CROSS/BLUE SHIELD | Attending: Emergency Medicine | Admitting: Emergency Medicine

## 2014-12-13 ENCOUNTER — Emergency Department (HOSPITAL_COMMUNITY): Payer: BLUE CROSS/BLUE SHIELD

## 2014-12-13 ENCOUNTER — Encounter (HOSPITAL_COMMUNITY): Payer: Self-pay | Admitting: Emergency Medicine

## 2014-12-13 ENCOUNTER — Telehealth: Payer: Self-pay | Admitting: Internal Medicine

## 2014-12-13 DIAGNOSIS — Z79899 Other long term (current) drug therapy: Secondary | ICD-10-CM | POA: Insufficient documentation

## 2014-12-13 DIAGNOSIS — Z8659 Personal history of other mental and behavioral disorders: Secondary | ICD-10-CM | POA: Insufficient documentation

## 2014-12-13 DIAGNOSIS — R071 Chest pain on breathing: Secondary | ICD-10-CM

## 2014-12-13 DIAGNOSIS — R011 Cardiac murmur, unspecified: Secondary | ICD-10-CM | POA: Insufficient documentation

## 2014-12-13 DIAGNOSIS — Z8619 Personal history of other infectious and parasitic diseases: Secondary | ICD-10-CM | POA: Diagnosis not present

## 2014-12-13 DIAGNOSIS — R091 Pleurisy: Secondary | ICD-10-CM | POA: Diagnosis not present

## 2014-12-13 DIAGNOSIS — E039 Hypothyroidism, unspecified: Secondary | ICD-10-CM | POA: Diagnosis not present

## 2014-12-13 DIAGNOSIS — Z8669 Personal history of other diseases of the nervous system and sense organs: Secondary | ICD-10-CM | POA: Insufficient documentation

## 2014-12-13 DIAGNOSIS — R079 Chest pain, unspecified: Secondary | ICD-10-CM

## 2014-12-13 DIAGNOSIS — J45909 Unspecified asthma, uncomplicated: Secondary | ICD-10-CM | POA: Diagnosis not present

## 2014-12-13 LAB — BASIC METABOLIC PANEL
Anion gap: 6 (ref 5–15)
BUN: 15 mg/dL (ref 6–23)
CO2: 22 mmol/L (ref 19–32)
Calcium: 8.8 mg/dL (ref 8.4–10.5)
Chloride: 108 mmol/L (ref 96–112)
Creatinine, Ser: 0.73 mg/dL (ref 0.50–1.10)
GFR calc Af Amer: >90 mL/min (ref >90)
Glucose, Bld: 115 mg/dL — ABNORMAL HIGH (ref 70–99)
POTASSIUM: 4.2 mmol/L (ref 3.5–5.1)
SODIUM: 136 mmol/L (ref 135–145)

## 2014-12-13 LAB — CBC WITH DIFFERENTIAL/PLATELET
Basophils Absolute: 0 10*3/uL (ref 0.0–0.1)
Basophils Relative: 0 % (ref 0–1)
EOS PCT: 3 % (ref 0–5)
Eosinophils Absolute: 0.2 10*3/uL (ref 0.0–0.7)
HCT: 36.8 % (ref 36.0–46.0)
Hemoglobin: 12.2 g/dL (ref 12.0–15.0)
LYMPHS ABS: 1.4 10*3/uL (ref 0.7–4.0)
Lymphocytes Relative: 18 % (ref 12–46)
MCH: 32.2 pg (ref 26.0–34.0)
MCHC: 33.2 g/dL (ref 30.0–36.0)
MCV: 97.1 fL (ref 78.0–100.0)
MONOS PCT: 6 % (ref 3–12)
Monocytes Absolute: 0.4 10*3/uL (ref 0.1–1.0)
NEUTROS PCT: 73 % (ref 43–77)
Neutro Abs: 5.8 10*3/uL (ref 1.7–7.7)
Platelets: 560 10*3/uL — ABNORMAL HIGH (ref 150–400)
RBC: 3.79 MIL/uL — ABNORMAL LOW (ref 3.87–5.11)
RDW: 11.8 % (ref 11.5–15.5)
WBC: 7.8 10*3/uL (ref 4.0–10.5)

## 2014-12-13 LAB — I-STAT TROPONIN, ED: TROPONIN I, POC: 0 ng/mL (ref 0.00–0.08)

## 2014-12-13 LAB — D-DIMER, QUANTITATIVE: D-Dimer, Quant: 8.45 ug/mL-FEU — ABNORMAL HIGH (ref 0.00–0.48)

## 2014-12-13 LAB — I-STAT BETA HCG BLOOD, ED (MC, WL, AP ONLY): I-stat hCG, quantitative: <5 m[IU]/mL (ref <5)

## 2014-12-13 MED ORDER — NAPROXEN 500 MG PO TABS: 500.0000 mg | ORAL_TABLET | Freq: Two times a day (BID) | ORAL | Status: DC

## 2014-12-13 MED ORDER — IOHEXOL 350 MG/ML SOLN
80.0000 mL | Freq: Once | INTRAVENOUS | Status: AC | PRN
  Administered 2014-12-13: 80 mL via INTRAVENOUS

## 2014-12-13 MED ORDER — KETOROLAC TROMETHAMINE 30 MG/ML IJ SOLN
30.0000 mg | Freq: Once | INTRAMUSCULAR | Status: AC
  Administered 2014-12-13: 30 mg via INTRAVENOUS
  Filled 2014-12-13: qty 1

## 2014-12-13 NOTE — Discharge Instructions (Signed)
Chest Pain (Nonspecific) °It is often hard to give a specific diagnosis for the cause of chest pain. There is always a chance that your pain could be related to something serious, such as a heart attack or a blood clot in the lungs. You need to follow up with your health care provider for further evaluation. °CAUSES  °· Heartburn. °· Pneumonia or bronchitis. °· Anxiety or stress. °· Inflammation around your heart (pericarditis) or lung (pleuritis or pleurisy). °· A blood clot in the lung. °· A collapsed lung (pneumothorax). It can develop suddenly on its own (spontaneous pneumothorax) or from trauma to the chest. °· Shingles infection (herpes zoster virus). °The chest wall is composed of bones, muscles, and cartilage. Any of these can be the source of the pain. °· The bones can be bruised by injury. °· The muscles or cartilage can be strained by coughing or overwork. °· The cartilage can be affected by inflammation and become sore (costochondritis). °DIAGNOSIS  °Lab tests or other studies may be needed to find the cause of your pain. Your health care provider may have you take a test called an ambulatory electrocardiogram (ECG). An ECG records your heartbeat patterns over a 24-hour period. You may also have other tests, such as: °· Transthoracic echocardiogram (TTE). During echocardiography, sound waves are used to evaluate how blood flows through your heart. °· Transesophageal echocardiogram (TEE). °· Cardiac monitoring. This allows your health care provider to monitor your heart rate and rhythm in real time. °· Holter monitor. This is a portable device that records your heartbeat and can help diagnose heart arrhythmias. It allows your health care provider to track your heart activity for several days, if needed. °· Stress tests by exercise or by giving medicine that makes the heart beat faster. °TREATMENT  °· Treatment depends on what may be causing your chest pain. Treatment may include: °· Acid blockers for  heartburn. °· Anti-inflammatory medicine. °· Pain medicine for inflammatory conditions. °· Antibiotics if an infection is present. °· You may be advised to change lifestyle habits. This includes stopping smoking and avoiding alcohol, caffeine, and chocolate. °· You may be advised to keep your head raised (elevated) when sleeping. This reduces the chance of acid going backward from your stomach into your esophagus. °Most of the time, nonspecific chest pain will improve within 2-3 days with rest and mild pain medicine.  °HOME CARE INSTRUCTIONS  °· If antibiotics were prescribed, take them as directed. Finish them even if you start to feel better. °· For the next few days, avoid physical activities that bring on chest pain. Continue physical activities as directed. °· Do not use any tobacco products, including cigarettes, chewing tobacco, or electronic cigarettes. °· Avoid drinking alcohol. °· Only take medicine as directed by your health care provider. °· Follow your health care provider's suggestions for further testing if your chest pain does not go away. °· Keep any follow-up appointments you made. If you do not go to an appointment, you could develop lasting (chronic) problems with pain. If there is any problem keeping an appointment, call to reschedule. °SEEK MEDICAL CARE IF:  °· Your chest pain does not go away, even after treatment. °· You have a rash with blisters on your chest. °· You have a fever. °SEEK IMMEDIATE MEDICAL CARE IF:  °· You have increased chest pain or pain that spreads to your arm, neck, jaw, back, or abdomen. °· You have shortness of breath. °· You have an increasing cough, or you cough   up blood. °· You have severe back or abdominal pain. °· You feel nauseous or vomit. °· You have severe weakness. °· You faint. °· You have chills. °This is an emergency. Do not wait to see if the pain will go away. Get medical help at once. Call your local emergency services (911 in [place] Do not drive  yourself to the hospital. °MAKE SURE YOU:  °· Understand these instructions. °· Will watch your condition. °· Will get help right away if you are not doing well or get worse. °Document Released: 07/28/2005 Document Revised: 10/23/2013 Document Reviewed: 05/23/2008 °ExitCare® Patient Information ©2015 [employer]. This information is not intended to replace advice given to you by your health care provider. Make sure you discuss any questions you have with your health care provider. °Pleurisy °Pleurisy is an inflammation and swelling of the lining of the lungs (pleura). Because of this inflammation, it hurts to breathe. It can be aggravated by coughing, laughing, or deep breathing. Pleurisy is often caused by an underlying infection or disease.  °HOME CARE INSTRUCTIONS  °Monitor your pleurisy for any changes. The following actions may help to alleviate any discomfort you are experiencing: °· Medicine may help with pain. Only take over-the-counter or prescription medicines for pain, discomfort, or fever as directed by your health care provider. °· Only take antibiotic medicine as directed. Make sure to finish it even if you start to feel better. °SEEK MEDICAL CARE IF:  °· Your pain is not controlled with medicine or is increasing. °· You have an increase in pus-like (purulent) secretions brought up with coughing. °SEEK IMMEDIATE MEDICAL CARE IF:  °· You have blue or dark lips, fingernails, or toenails. °· You are coughing up blood. °· You have increased difficulty breathing. °· You have continuing pain unrelieved by medicine or pain lasting more than 1 week. °· You have pain that radiates into your neck, arms, or jaw. °· You develop increased shortness of breath or wheezing. °· You develop a fever, rash, vomiting, fainting, or other serious symptoms. °MAKE SURE YOU: °· Understand these instructions.   °· Will watch your condition.   °· Will get help right away if you are not doing well or get worse. °  °Document  Released: 10/18/2005 Document Revised: 06/20/2013 Document Reviewed: 04/01/2013 °ExitCare® Patient Information ©2015 [employer]. This information is not intended to replace advice given to you by your health care provider. Make sure you discuss any questions you have with your health care provider. ° °

## 2014-12-13 NOTE — Telephone Encounter (Signed)
[facility] - [facility] Patient Name: Stephanie Schwartz Gender: Female DOB: 05-12-1966 Age: 49 Y 3 M 16 D Return Phone Number: [phone] (Primary) Address: City/State/Zip: [place] Client [facility] - Client Client Site [facility] - Day Physician Cathlean Cower Contact Type Call Call Type Triage / Clinical Relationship To Patient Self Appointment Disposition EMR Appointment Not Necessary Return Phone Number [phone] (Primary) Chief Complaint CHEST PAIN (>=21 years) - pain, pressure, heaviness or tightness Initial Comment Caller states she woke up with sharp pain on right rib cage- feels like someone is stabbing her with a knife in her ribs. PreDisposition Home Care Info pasted into Epic Yes Nurse Assessment Nurse: Ronnald Ramp, RN, Miranda Date/Time (Eastern Time): 12/13/2014 8:41:24 AM Confirm and document reason for call. If symptomatic, describe symptoms. ---Caller states she had some discomfort in her upper abdomen just beneath her breasts. During the night she started having stabbing pain in the right side of her abdomen when taking a deep breath. She was diagnosed with a possible Hemangioma and seen by MD yesterday for that work up and referred for MRI. Has the patient traveled out of the country within the last 30 days? ---Not Applicable Does the patient require triage? ---Yes Related visit to physician within the last 2 weeks? ---No Does the PT have any chronic conditions? (i.e. diabetes, asthma, etc.) ---No Did the patient indicate they were pregnant? ---No Guidelines Guideline Title Affirmed Question Affirmed Notes Nurse Date/Time (Eastern Time) Chest Pain Major surgery in the past month [place] 12/13/2014 8:44:33 AM Disp. Time Eilene Ghazi Time) Disposition Final User 12/13/2014 8:38:59 AM Send to Urgent Queue Baruch Goldmann 12/13/2014 8:47:58 AM Go to ED Now Yes  Ronnald Ramp, RN, Miranda PLEASE NOTE: All timestamps contained within this report are represented as [place] Standard Time. CONFIDENTIALTY NOTICE: This fax transmission is intended only for the addressee. It contains information that is legally privileged, confidential or otherwise protected from use or disclosure. If you are not the intended recipient, you are strictly prohibited from reviewing, disclosing, copying using or disseminating any of this information or taking any action in reliance on or regarding this information. If you have received this fax in error, please notify us immediately by telephone so that we can arrange for its return to [place]. Phone: [phone], Toll-Free: [phone], Fax: [phone] Page: 2 of 2 Call Id: [id number] Caller Understands: Yes Disagree/Comply: Comply Care Advice Given Per Guideline GO TO ED NOW: You need to be seen in the Emergency Department. Go to the ER at ___________ [place] now. Drive carefully. DRIVING: Another adult should drive. CALL EMS IF: * Severe difficulty breathing occurs * Passes out or becomes too weak to stand * You become worse. CARE ADVICE given per Chest Pain (Adult) guideline. After Care Instructions Given Call Event Type User Date / Time Description Referrals [facility] - ED

## 2014-12-13 NOTE — ED Notes (Signed)
Pt reports that she recently had a laparoscopic appendectomy on 12-02-14. Pt reports that last night she began having right sided CP which only occurs upon inspiration. Pt denies SOB. NAD at this time.

## 2014-12-13 NOTE — Telephone Encounter (Signed)
PLEASE NOTE: All timestamps contained within this report are represented as [place] Standard Time. CONFIDENTIALTY NOTICE: This fax transmission is intended only for the addressee. It contains information that is legally privileged, confidential or otherwise protected from use or disclosure. If you are not the intended recipient, you are strictly prohibited from reviewing, disclosing, copying using or disseminating any of this information or taking any action in reliance on or regarding this information. If you have received this fax in error, please notify us immediately by telephone so that we can arrange for its return to [place]. Phone: [phone], Toll-Free: [phone], Fax: [phone] Page: 1 of 1 Call Id: [id number] [facility] Client [place] Patient Name: Stephanie Schwartz DOB: [birth year] Initial Comment Caller states she woke up with sharp pain on right rib cage- feels like someone is stabbing her with a knife in her ribs. Nurse Assessment Nurse: Ronnald Ramp, RN, Miranda Date/Time (Eastern Time): 12/13/2014 8:41:24 AM Confirm and document reason for call. If symptomatic, describe symptoms. ---Caller states she had some discomfort in her upper abdomen just beneath her breasts. During the night she started having stabbing pain in the right side of her abdomen when taking a deep breath. She was diagnosed with a possible Hemangioma and seen by MD yesterday for that work up and referred for MRI. Has the patient traveled out of the country within the last 30 days? ---Not Applicable Does the patient require triage? ---Yes Related visit to physician within the last 2 weeks? ---No Does the PT have any chronic conditions? (i.e. diabetes, asthma, etc.) ---No Did the patient indicate they were pregnant? ---No Guidelines Guideline Title Affirmed Question Affirmed Notes Chest Pain Major surgery in the past month Final Disposition  User Go to ED Now Ronnald Ramp, RN, [employer]

## 2014-12-13 NOTE — ED Notes (Signed)
Pt given IS and pt demonstrated how to use it.

## 2014-12-13 NOTE — ED Provider Notes (Signed)
CSN: [id number]     Arrival date & time 12/13/14  1033 History   First MD Initiated Contact with Patient 12/13/14 1055     Chief Complaint  Patient presents with  . Chest Pain     (Consider location/radiation/quality/duration/timing/severity/associated sxs/prior Treatment) HPI Comments: Patient with past medical history of recent appendectomy about a week ago presents emergency department with chief complaint of chest pain. She states that the pain is in her right inferior chest. She states that she feels a "stitch" in her ribs whenever she takes a deep breath. She denies any fevers, chills, cough, nausea, vomiting, or diarrhea. She states that she has been recovering well since the appendectomy. She states that all of last week she was laid up in bed sleeping. There are no alleviating factors. Patient has no prior history of PE or DVT. She denies any leg swelling or leg pain. She states that the pain is a 6 out of 10 when she takes deep breath.  The history is provided by the patient. No language interpreter was used.    Past Medical History  Diagnosis Date  . SHINGLES 06/07/2008  . HYPERCHOLESTEROLEMIA 08/21/2007  . OTITIS MEDIA, SEROUS, ACUTE, LEFT 03/15/2008  . ASTHMATIC BRONCHITIS, ACUTE 10/14/2008  . ALLERGIC RHINITIS 03/01/2008  . Wheezing 10/14/2009  . Anxiety 02/25/2012  . Heart murmur     "maybe" (12/02/2014)  . ASTHMA     "allergy induced"  . HYPOTHYROIDISM    Past Surgical History  Procedure Laterality Date  . Shoulder arthroscopy Left ~ 2009  . Appendectomy  12/02/2014    lap  . Reduction mammaplasty Bilateral ~ 1995  . Laparoscopic appendectomy N/A 12/02/2014    Procedure: APPENDECTOMY LAPAROSCOPIC;  Surgeon: Ralene Ok, MD;  Location: [facility] OR;  Service: General;  Laterality: N/A;   Family History  Problem Relation Age of Onset  . Alcohol abuse Other     ETOH  . Cancer Other     Breast Cancer  . Hypertension Other   . Hyperlipidemia Other   . Diabetes Other     History  Substance Use Topics  . Smoking status: Never Smoker   . Smokeless tobacco: Never Used  . Alcohol Use: 8.4 oz/week    14 Glasses of wine per week   OB History    No data available     Review of Systems  Constitutional: Negative for fever and chills.  Respiratory: Negative for shortness of breath.   Cardiovascular: Positive for chest pain.  Gastrointestinal: Negative for nausea, vomiting, diarrhea and constipation.  Genitourinary: Negative for dysuria.  All other systems reviewed and are negative.     Allergies  Review of patient's allergies indicates no known allergies.  Home Medications   Prior to Admission medications   Medication Sig Start Date End Date Taking? Authorizing Provider  loratadine-pseudoephedrine (CLARITIN-D 24-HOUR) 10-240 MG per 24 hr tablet Take 1 tablet by mouth daily.      Historical Provider, MD  SYMBICORT 160-4.5 MCG/ACT inhaler INHALE 2 PUFFS INTO THE LUNGS 2 (TWO) TIMES DAILY. 08/10/13   Biagio Borg, MD  thyroid (ARMOUR) 180 MG tablet Take 180 mg by mouth daily.    Historical Provider, MD   BP 113/68 mmHg  Pulse 66  Temp(Src) 98 F (36.7 C) (Oral)  Resp 16  Ht 5\' 9"  (1.753 m)  Wt 175 lb (79.379 kg)  BMI 25.83 kg/m2  SpO2 99%  LMP 12/13/2014 Physical Exam  Constitutional: She is oriented to person, place, and time. She  appears well-developed and well-nourished.  HENT:  Head: Normocephalic and atraumatic.  Eyes: Conjunctivae and EOM are normal. Pupils are equal, round, and reactive to light.  Neck: Normal range of motion. Neck supple.  Cardiovascular: Normal rate and regular rhythm.  Exam reveals no gallop and no friction rub.   No murmur heard. Pulmonary/Chest: Effort normal and breath sounds normal. No respiratory distress. She has no wheezes. She has no rales. She exhibits no tenderness.  Abdominal: Soft. Bowel sounds are normal. She exhibits no distension and no mass. There is no tenderness. There is no rebound and no  guarding.  Well healing abdominal incisions from laparoscopic appendectomy, no discharge, no tenderness, no evidence of infection  Musculoskeletal: Normal range of motion. She exhibits no edema or tenderness.  No lower extremity swelling or pain  Neurological: She is alert and oriented to person, place, and time.  Skin: Skin is warm and dry.  Psychiatric: She has a normal mood and affect. Her behavior is normal. Judgment and thought content normal.  Nursing note and vitals reviewed.   ED Course  Procedures (including critical care time) Results for orders placed or performed during the hospital encounter of 12/13/14  D-dimer, quantitative  Result Value Ref Range   D-Dimer, Quant 8.45 (H) 0.00 - 0.48 ug/mL-FEU  CBC with Differential/Platelet  Result Value Ref Range   WBC 7.8 4.0 - 10.5 K/uL   RBC 3.79 (L) 3.87 - 5.11 MIL/uL   Hemoglobin 12.2 12.0 - 15.0 g/dL   HCT 36.8 36.0 - 46.0 %   MCV 97.1 78.0 - 100.0 fL   MCH 32.2 26.0 - 34.0 pg   MCHC 33.2 30.0 - 36.0 g/dL   RDW 11.8 11.5 - 15.5 %   Platelets 560 (H) 150 - 400 K/uL   Neutrophils Relative % 73 43 - 77 %   Neutro Abs 5.8 1.7 - 7.7 K/uL   Lymphocytes Relative 18 12 - 46 %   Lymphs Abs 1.4 0.7 - 4.0 K/uL   Monocytes Relative 6 3 - 12 %   Monocytes Absolute 0.4 0.1 - 1.0 K/uL   Eosinophils Relative 3 0 - 5 %   Eosinophils Absolute 0.2 0.0 - 0.7 K/uL   Basophils Relative 0 0 - 1 %   Basophils Absolute 0.0 0.0 - 0.1 K/uL  Basic metabolic panel  Result Value Ref Range   Sodium 136 135 - 145 mmol/L   Potassium 4.2 3.5 - 5.1 mmol/L   Chloride 108 96 - 112 mmol/L   CO2 22 19 - 32 mmol/L   Glucose, Bld 115 (H) 70 - 99 mg/dL   BUN 15 6 - 23 mg/dL   Creatinine, Ser 0.73 0.50 - 1.10 mg/dL   Calcium 8.8 8.4 - 10.5 mg/dL   GFR calc non Af Amer >90 >90 mL/min   GFR calc Af Amer >90 >90 mL/min   Anion gap 6 5 - 15  I-stat troponin, ED  Result Value Ref Range   Troponin i, poc 0.00 0.00 - 0.08 ng/mL   Comment 3          I-Stat  Beta hCG blood, ED ([facility], [facility], [facility] only)  Result Value Ref Range   I-stat hCG, quantitative <5.0 <5 mIU/mL   Comment 3           Ct Angio Chest Pe W/cm &/or Wo Cm  12/13/2014   ADDENDUM REPORT: 12/13/2014 14:53  ADDENDUM: The first sentence of the Impression should read " No definite evidence of pulmonary embolus ".  Electronically Signed   By: Marijo Conception, M.D.   On: 12/13/2014 14:53   12/13/2014   CLINICAL DATA:  Acute right-sided chest pain.  EXAM: CT ANGIOGRAPHY CHEST WITH CONTRAST  TECHNIQUE: Multidetector CT imaging of the chest was performed using the standard protocol during bolus administration of intravenous contrast. Multiplanar CT image reconstructions and MIPs were obtained to evaluate the vascular anatomy.  CONTRAST:  11mL OMNIPAQUE IOHEXOL 350 MG/ML SOLN  COMPARISON:  Chest radiograph of same day.  FINDINGS: No pneumothorax or plural effusion is noted. Minimal right posterior basilar subsegmental atelectasis is noted. No other significant pulmonary parenchymal abnormality is noted. There is no definite evidence of pulmonary embolus. No evidence thoracic aortic dissection is noted. Mild dilatation of ascending thoracic aorta is noted. It 3.7 cm in maximum diameter. No mediastinal mass or adenopathy is noted. Visualized portion of upper abdomen appears normal. No significant osseous abnormality is noted.  Review of the MIP images confirms the above findings.  IMPRESSION: No death and evidence of pulmonary embolus. Minimal right posterior basilar subsegmental atelectasis is noted.  Electronically Signed: By: Marijo Conception, M.D. On: 12/13/2014 14:28   Ct Abdomen Pelvis W Contrast  12/02/2014   CLINICAL DATA:  Right lower quadrant pain.  EXAM: CT ABDOMEN AND PELVIS WITH CONTRAST  TECHNIQUE: Multidetector CT imaging of the abdomen and pelvis was performed using the standard protocol following bolus administration of intravenous contrast.  CONTRAST:  139mL OMNIPAQUE IOHEXOL 300 MG/ML  SOLN   COMPARISON:  None.  FINDINGS: BODY WALL: Unremarkable.  LOWER CHEST: Subsegmental atelectasis in the lower lungs.  ABDOMEN/PELVIS:  Liver: 5 cm subcapsular liver mass in the anterior segment right Liver. The margins are lobulated and ill-defined with small areas of peripheral calcification. There is delayed enhancement which is incomplete.  Biliary: No evidence of biliary obstruction or stone.  Pancreas: Unremarkable.  Spleen: Unremarkable.  Adrenals: Unremarkable.  Kidneys and ureters: No hydronephrosis or stone.  Bladder: Unremarkable.  Reproductive: Probable hemorrhagic cyst/ follicle in the right ovary, 2.4 cm in diameter.  Bowel: The appendix is markedly dilated and contains fluid and debris. The surrounding fat is also infiltrated. The lateral wall of the appendix is indistinct and contiguous with localized peritoneal fluid in the right lower quadrant; there is minimal high-density debris is noted in the pelvis. No rim enhancing abscess identified. The appendix is in the expected anatomic location, and positioned right of the cecum.  Vascular: No acute abnormality.  OSSEOUS: No acute abnormalities.  Critical Value/emergent results were called by telephone at the time of interpretation on 12/02/2014 at 8:51 am to Dr. Eulis Foster, who verbally acknowledged these results.  IMPRESSION: 1. Advanced acute appendicitis. There is likely appendiceal perforation, but no nature abscess. 2. Indeterminate 5 cm liver mass. The appearance can be seen with atypical/hyalinzed hemangioma, but further evaluation is recommended with liver MRI.   Electronically Signed   By: Jorje Guild M.D.   On: 12/02/2014 08:53   Dg Chest Port 1 View  12/13/2014   CLINICAL DATA:  Emergent appendectomy February 1st with persistent stabbing pain under right rib cage, worse with inspiration.  EXAM: PORTABLE CHEST - 1 VIEW  COMPARISON:  CT abdomen pelvis -12/02/2014  FINDINGS: Normal cardiac silhouette and mediastinal contours given decreased lung  volumes and patient rotation to the right. There is mild elevation / eventration of the right hemidiaphragm. No focal airspace opacities. No pleural effusion or pneumothorax. No evidence of edema. No acute osseus abnormalities.  IMPRESSION: No acute cardiopulmonary  disease on this hypoventilated portable examination.   Electronically Signed   By: Sandi Mariscal M.D.   On: 12/13/2014 12:21      EKG Interpretation   Date/Time:  Friday December 13 2014 10:52:52 EST Ventricular Rate:  65 PR Interval:  143 QRS Duration: 82 QT Interval:  439 QTC Calculation: 456 R Axis:   13 Text Interpretation:  Sinus rhythm Baseline wander in lead(s) II III aVF  V1 V4 No prior for comparison Confirmed by DOCHERTY  MD, MEGAN ([id number]) on  12/13/2014 12:22:19 PM      MDM   Final diagnoses:  Chest pain  Pleurisy   Patient with right chest pain. She is status post appendectomy on 12/02/14. She is not tachycardic or hypoxic, but the pain is pleuritic and constant. There is some concern for PE given recent surgery and immobilization. We'll check d-dimer. Will obtain screening plain film of the chest to rule out pneumothorax, or pneumonia.  D-dimer is elevated.  Will check CT.  CT is negative. Anticipate discharge with NSAIDs for pleurisy.  Patient seen by and discussed with Dr. Tawnya Crook, who recommends discharge with naprosyn.  No evidence of PE, pneumothorax, pneumonia, doubt ACS, no abdominal pain.  Will discharge to home with PCP follow-up.   Montine Circle, PA-C 12/13/14 1610  Ernestina Patches, MD 12/16/14 1001

## 2014-12-31 ENCOUNTER — Telehealth: Payer: Self-pay | Admitting: Internal Medicine

## 2014-12-31 MED ORDER — ALPRAZOLAM 0.5 MG PO TABS: 0.5000 mg | ORAL_TABLET | Freq: Every day | ORAL | Status: DC | PRN

## 2014-12-31 NOTE — Telephone Encounter (Signed)
Done hardcopy to Cherina  

## 2015-01-01 NOTE — Telephone Encounter (Signed)
rx sent to pharmacy

## 2015-01-09 ENCOUNTER — Ambulatory Visit
Admission: RE | Admit: 2015-01-09 | Discharge: 2015-01-09 | Disposition: A | Discharge status: Home / Self Care | Payer: BLUE CROSS/BLUE SHIELD | Source: Ambulatory Visit | Attending: Internal Medicine | Admitting: Internal Medicine

## 2015-01-09 DIAGNOSIS — R160 Hepatomegaly, not elsewhere classified: Secondary | ICD-10-CM

## 2015-01-09 MED ORDER — GADOXETATE DISODIUM 0.25 MMOL/ML IV SOLN
8.0000 mL | Freq: Once | INTRAVENOUS | Status: AC | PRN
  Administered 2015-01-09: 8 mL via INTRAVENOUS

## 2015-03-03 ENCOUNTER — Other Ambulatory Visit: Payer: Self-pay

## 2015-03-03 DIAGNOSIS — Z1231 Encounter for screening mammogram for malignant neoplasm of breast: Secondary | ICD-10-CM

## 2015-03-06 ENCOUNTER — Ambulatory Visit
Admission: RE | Admit: 2015-03-06 | Discharge: 2015-03-06 | Disposition: A | Discharge status: Home / Self Care | Payer: BLUE CROSS/BLUE SHIELD | Source: Ambulatory Visit

## 2015-03-06 DIAGNOSIS — Z1231 Encounter for screening mammogram for malignant neoplasm of breast: Secondary | ICD-10-CM

## 2015-03-18 ENCOUNTER — Other Ambulatory Visit: Payer: Self-pay

## 2015-03-18 MED ORDER — ALPRAZOLAM 0.5 MG PO TABS: 0.5000 mg | ORAL_TABLET | Freq: Every day | ORAL | Status: DC | PRN

## 2015-03-18 NOTE — Telephone Encounter (Signed)
Done hardcopy to Dahlia  

## 2015-03-19 NOTE — Telephone Encounter (Signed)
Rx faxed to pharmacy  

## 2015-05-29 ENCOUNTER — Telehealth: Payer: Self-pay

## 2015-05-29 MED ORDER — ZALEPLON 5 MG PO CAPS: 5.0000 mg | ORAL_CAPSULE | Freq: Every evening | ORAL | Status: DC | PRN

## 2015-05-29 NOTE — Telephone Encounter (Signed)
rx request from [facility] on  [address] for refill of Zaleplon 5mg  cap. Not current on med list.

## 2015-05-29 NOTE — Telephone Encounter (Signed)
Done hardcopy to Dahlia  

## 2015-05-30 NOTE — Telephone Encounter (Signed)
Rx faxed to pharmacy  

## 2015-07-01 ENCOUNTER — Ambulatory Visit: Payer: BLUE CROSS/BLUE SHIELD | Admitting: Internal Medicine

## 2015-07-03 ENCOUNTER — Ambulatory Visit (INDEPENDENT_AMBULATORY_CARE_PROVIDER_SITE_OTHER): Payer: BLUE CROSS/BLUE SHIELD | Admitting: Internal Medicine

## 2015-07-03 ENCOUNTER — Encounter: Payer: Self-pay | Admitting: Internal Medicine

## 2015-07-03 ENCOUNTER — Other Ambulatory Visit (INDEPENDENT_AMBULATORY_CARE_PROVIDER_SITE_OTHER): Payer: BLUE CROSS/BLUE SHIELD

## 2015-07-03 VITALS — BP 130/80 | HR 76 | Temp 98.2°F | Ht 69.0 in | Wt 178.0 lb

## 2015-07-03 DIAGNOSIS — G4700 Insomnia, unspecified: Secondary | ICD-10-CM

## 2015-07-03 DIAGNOSIS — R7989 Other specified abnormal findings of blood chemistry: Secondary | ICD-10-CM

## 2015-07-03 DIAGNOSIS — Z0000 Encounter for general adult medical examination without abnormal findings: Secondary | ICD-10-CM

## 2015-07-03 DIAGNOSIS — F419 Anxiety disorder, unspecified: Secondary | ICD-10-CM

## 2015-07-03 LAB — URINALYSIS, ROUTINE W REFLEX MICROSCOPIC
Bilirubin Urine: NEGATIVE
HGB URINE DIPSTICK: NEGATIVE
Ketones, ur: NEGATIVE
Nitrite: NEGATIVE
SPECIFIC GRAVITY, URINE: 1.025 (ref 1.000–1.030)
Total Protein, Urine: NEGATIVE
Urine Glucose: NEGATIVE
Urobilinogen, UA: 0.2 (ref 0.0–1.0)
pH: 6 (ref 5.0–8.0)

## 2015-07-03 LAB — CBC WITH DIFFERENTIAL/PLATELET
BASOS PCT: 0.6 % (ref 0.0–3.0)
Basophils Absolute: 0 10*3/uL (ref 0.0–0.1)
EOS ABS: 0.2 10*3/uL (ref 0.0–0.7)
Eosinophils Relative: 3 % (ref 0.0–5.0)
HCT: 40.2 % (ref 36.0–46.0)
Hemoglobin: 13.7 g/dL (ref 12.0–15.0)
LYMPHS ABS: 1.3 10*3/uL (ref 0.7–4.0)
Lymphocytes Relative: 23.4 % (ref 12.0–46.0)
MCHC: 34 g/dL (ref 30.0–36.0)
MCV: 95.9 fl (ref 78.0–100.0)
MONO ABS: 0.5 10*3/uL (ref 0.1–1.0)
Monocytes Relative: 7.9 % (ref 3.0–12.0)
NEUTROS ABS: 3.7 10*3/uL (ref 1.4–7.7)
NEUTROS PCT: 65.1 % (ref 43.0–77.0)
PLATELETS: 303 10*3/uL (ref 150.0–400.0)
RBC: 4.19 Mil/uL (ref 3.87–5.11)
RDW: 12.9 % (ref 11.5–15.5)
WBC: 5.7 10*3/uL (ref 4.0–10.5)

## 2015-07-03 LAB — BASIC METABOLIC PANEL
BUN: 17 mg/dL (ref 6–23)
CHLORIDE: 105 meq/L (ref 96–112)
CO2: 23 mEq/L (ref 19–32)
Calcium: 9.4 mg/dL (ref 8.4–10.5)
Creatinine, Ser: 0.72 mg/dL (ref 0.40–1.20)
GFR: 92.34 mL/min (ref >60.00)
Glucose, Bld: 93 mg/dL (ref 70–99)
POTASSIUM: 4.4 meq/L (ref 3.5–5.1)
Sodium: 136 mEq/L (ref 135–145)

## 2015-07-03 LAB — LIPID PANEL
CHOL/HDL RATIO: 4
CHOLESTEROL: 214 mg/dL — AB (ref 0–200)
HDL: 53 mg/dL (ref >39.00)
NonHDL: 160.68
Triglycerides: 280 mg/dL — ABNORMAL HIGH (ref 0.0–149.0)
VLDL: 56 mg/dL — ABNORMAL HIGH (ref 0.0–40.0)

## 2015-07-03 LAB — HEPATIC FUNCTION PANEL
ALK PHOS: 63 U/L (ref 39–117)
ALT: 30 U/L (ref 0–35)
AST: 21 U/L (ref 0–37)
Albumin: 4.2 g/dL (ref 3.5–5.2)
BILIRUBIN DIRECT: 0.1 mg/dL (ref 0.0–0.3)
BILIRUBIN TOTAL: 0.3 mg/dL (ref 0.2–1.2)
Total Protein: 7.1 g/dL (ref 6.0–8.3)

## 2015-07-03 LAB — TSH: TSH: 0.66 u[IU]/mL (ref 0.35–4.50)

## 2015-07-03 LAB — LDL CHOLESTEROL, DIRECT: LDL DIRECT: 133 mg/dL

## 2015-07-03 MED ORDER — ZALEPLON 5 MG PO CAPS: 5.0000 mg | ORAL_CAPSULE | Freq: Every evening | ORAL | Status: DC | PRN

## 2015-07-03 MED ORDER — ESCITALOPRAM OXALATE 10 MG PO TABS: 10.0000 mg | ORAL_TABLET | Freq: Every day | ORAL | Status: DC

## 2015-07-03 NOTE — Assessment & Plan Note (Signed)
With recent mild social stressors increased, to cont counseling efforts, for lexapro 10 qd

## 2015-07-03 NOTE — Assessment & Plan Note (Signed)

## 2015-07-03 NOTE — Progress Notes (Signed)
Pre visit review using our clinic review tool, if applicable. No additional management support is needed unless otherwise documented below in the visit note. 

## 2015-07-03 NOTE — Progress Notes (Signed)
Subjective:    Patient ID: Stephanie Schwartz, female    DOB: 1966/02/07, 49 y.o.   MRN: [id number]  HPI   Here for wellness and f/u;  Overall doing ok;  Pt denies Chest pain, worsening SOB, DOE, wheezing, orthopnea, PND, worsening LE edema, palpitations, dizziness or syncope.  Pt denies neurological change such as new headache, facial or extremity weakness.  Pt denies polydipsia, polyuria, or low sugar symptoms. Pt states overall good compliance with treatment and medications, good tolerability, and has been trying to follow appropriate diet.  Pt denies worsening depressive symptoms, suicidal ideation or panic. No fever, night sweats, wt loss, loss of appetite, or other constitutional symptoms.  Pt states good ability with ADL's, has low fall risk, home safety reviewed and adequate, no other significant changes in hearing or vision, and only occasionally active with exercise. Has been More tense and sleeps poorly according to her fitbit.  No snoring, or stopping breathing.  In couples counseling. Did well with lexapro in past, off for seveal yrs, asks to re-start.  Did have a1c with endo last month - reportedly normal per pt.  Wt Readings from Last 3 Encounters:  07/03/15 178 lb (80.74 kg)  12/13/14 175 lb (79.379 kg)  12/12/14 180 lb 3.2 oz (81.738 kg)   Past Medical History  Diagnosis Date  . SHINGLES 06/07/2008  . HYPERCHOLESTEROLEMIA 08/21/2007  . OTITIS MEDIA, SEROUS, ACUTE, LEFT 03/15/2008  . ASTHMATIC BRONCHITIS, ACUTE 10/14/2008  . ALLERGIC RHINITIS 03/01/2008  . Wheezing 10/14/2009  . Anxiety 02/25/2012  . Heart murmur     "maybe" (12/02/2014)  . ASTHMA     "allergy induced"  . HYPOTHYROIDISM    Past Surgical History  Procedure Laterality Date  . Shoulder arthroscopy Left ~ 2009  . Appendectomy  12/02/2014    lap  . Reduction mammaplasty Bilateral ~ 1995  . Laparoscopic appendectomy N/A 12/02/2014    Procedure: APPENDECTOMY LAPAROSCOPIC;  Surgeon: Ralene Ok, MD;  Location: [facility];  Service: General;  Laterality: N/A;    reports that she has never smoked. She has never used smokeless tobacco. She reports that she drinks about 8.4 oz of alcohol per week. She reports that she does not use illicit drugs. family history includes Alcohol abuse in her other; Cancer in her other; Diabetes in her other; Hyperlipidemia in her other; Hypertension in her other. No Known Allergies Current Outpatient Prescriptions on File Prior to Visit  Medication Sig Dispense Refill  . ALPRAZolam (XANAX) 0.5 MG tablet Take 1 tablet (0.5 mg total) by mouth daily as needed for anxiety. 30 tablet 2  . ibuprofen (ADVIL,MOTRIN) 200 MG tablet Take 200 mg by mouth every 6 (six) hours as needed for mild pain or moderate pain.    Marland Kitchen loratadine-pseudoephedrine (CLARITIN-D 24-HOUR) 10-240 MG per 24 hr tablet Take 1 tablet by mouth daily.      . SYMBICORT 160-4.5 MCG/ACT inhaler INHALE 2 PUFFS INTO THE LUNGS 2 (TWO) TIMES DAILY. 10.2 g 10  . thyroid (ARMOUR) 180 MG tablet Take 180 mg by mouth daily.    . naproxen (NAPROSYN) 500 MG tablet Take 1 tablet (500 mg total) by mouth 2 (two) times daily with a meal. (Patient not taking: Reported on 07/03/2015) 30 tablet 0  . ondansetron (ZOFRAN-ODT) 4 MG disintegrating tablet Take 4 mg by mouth daily as needed.    Marland Kitchen oxyCODONE (OXY IR/ROXICODONE) 5 MG immediate release tablet Take 10 mg by mouth daily as needed.     No current  facility-administered medications on file prior to visit.     Review of Systems Constitutional: Negative for increased diaphoresis, other activity, appetite or siginficant weight change other than noted HENT: Negative for worsening hearing loss, ear pain, facial swelling, mouth sores and neck stiffness.   Eyes: Negative for other worsening pain, redness or visual disturbance.  Respiratory: Negative for shortness of breath and wheezing  Cardiovascular: Negative for chest pain and palpitations.  Gastrointestinal: Negative for diarrhea, blood in  stool, abdominal distention or other pain Genitourinary: Negative for hematuria, flank pain or change in urine volume.  Musculoskeletal: Negative for myalgias or other joint complaints.  Skin: Negative for color change and wound or drainage.  Neurological: Negative for syncope and numbness. other than noted Hematological: Negative for adenopathy. or other swelling Psychiatric/Behavioral: Negative for hallucinations, SI, self-injury, decreased concentration or other worsening agitation.      Objective:   Physical Exam BP 138/82 mmHg  Pulse 76  Temp(Src) 98.2 F (36.8 C) (Oral)  Ht 5\' 9"  (1.753 m)  Wt 178 lb (80.74 kg)  BMI 26.27 kg/m2  SpO2 98%  LMP 06/17/2015 VS noted,  Constitutional: Pt is oriented to person, place, and time. Appears well-developed and well-nourished, in no significant distress Head: Normocephalic and atraumatic.  Right Ear: External ear normal.  Left Ear: External ear normal.  Nose: Nose normal.  Mouth/Throat: Oropharynx is clear and moist.  Eyes: Conjunctivae and EOM are normal. Pupils are equal, round, and reactive to light.  Neck: Normal range of motion. Neck supple. No JVD present. No tracheal deviation present or significant neck LA or mass Cardiovascular: Normal rate, regular rhythm, normal heart sounds and intact distal pulses.   Pulmonary/Chest: Effort normal and breath sounds without rales or wheezing  Abdominal: Soft. Bowel sounds are normal. NT. No HSM  Musculoskeletal: Normal range of motion. Exhibits no edema.  Lymphadenopathy:  Has no cervical adenopathy.  Neurological: Pt is alert and oriented to person, place, and time. Pt has normal reflexes. No cranial nerve deficit. Motor grossly intact Skin: Skin is warm and dry. No rash noted.  Psychiatric:  Has normal mood and affect. Behavior is normal. mild nervous    Assessment & Plan:

## 2015-07-03 NOTE — Patient Instructions (Signed)
Please take all new medication as prescribed - the lexapro  Please continue all other medications as before, and refills have been done if requested - the sonata  Please have the pharmacy call with any other refills you may need.  Please continue your efforts at being more active, low cholesterol diet, and weight control.  You are otherwise up to date with prevention measures today.  Please keep your appointments with your specialists as you may have planned  Please go to the LAB in the Basement (turn left off the elevator) for the tests to be done today  You will be contacted by phone if any changes need to be made immediately.  Otherwise, you will receive a letter about your results with an explanation, but please check with MyChart first.  Please remember to sign up for MyChart if you have not done so, as this will be important to you in the future with finding out test results, communicating by private email, and scheduling acute appointments online when needed.  Please return in 1 year for your yearly visit, or sooner if needed, with Lab testing done 3-5 days before

## 2015-07-03 NOTE — Assessment & Plan Note (Signed)
For sonata refill,  to f/u any worsening symptoms or concerns

## 2015-09-17 ENCOUNTER — Telehealth: Payer: Self-pay | Admitting: Internal Medicine

## 2015-09-17 MED ORDER — ALPRAZOLAM 0.5 MG PO TABS: 0.5000 mg | ORAL_TABLET | Freq: Every day | ORAL | Status: DC | PRN

## 2015-09-17 NOTE — Telephone Encounter (Signed)
Patient is requesting a fill of ALPRAZolam (XANAX) 0.5 MG tablet [id number] . Verified pharmacy on file. States that they have sent multiple requests. No requests shown received.

## 2015-09-17 NOTE — Telephone Encounter (Signed)
Done hardcopy to Dahlia  

## 2015-09-18 NOTE — Telephone Encounter (Signed)
Rx faxed to pharmacy  

## 2015-09-29 ENCOUNTER — Other Ambulatory Visit: Payer: Self-pay | Admitting: Internal Medicine

## 2015-12-17 ENCOUNTER — Telehealth: Payer: Self-pay

## 2015-12-17 MED ORDER — ALPRAZOLAM 0.5 MG PO TABS: 0.5000 mg | ORAL_TABLET | Freq: Every day | ORAL | Status: DC | PRN

## 2015-12-17 NOTE — Telephone Encounter (Signed)
Please advise, patient is requesting refill on Xanax.

## 2015-12-17 NOTE — Addendum Note (Signed)
Addended by: Biagio Borg on: 12/17/2015 07:17 PM   Modules accepted: Orders

## 2015-12-17 NOTE — Telephone Encounter (Signed)
Done hardcopy to Corinne  

## 2015-12-18 NOTE — Telephone Encounter (Signed)
Medication printed signed and faxed to pharmacy  

## 2015-12-31 ENCOUNTER — Encounter: Payer: Self-pay | Admitting: Family

## 2015-12-31 ENCOUNTER — Ambulatory Visit (INDEPENDENT_AMBULATORY_CARE_PROVIDER_SITE_OTHER): Payer: 59 | Admitting: Family

## 2015-12-31 VITALS — BP 114/80 | HR 73 | Temp 98.1°F | Resp 16 | Ht 69.0 in | Wt 180.0 lb

## 2015-12-31 DIAGNOSIS — J069 Acute upper respiratory infection, unspecified: Secondary | ICD-10-CM | POA: Diagnosis not present

## 2015-12-31 MED ORDER — ZALEPLON 5 MG PO CAPS: 5.0000 mg | ORAL_CAPSULE | Freq: Every evening | ORAL | Status: DC | PRN

## 2015-12-31 MED ORDER — HYDROCODONE-HOMATROPINE 5-1.5 MG/5ML PO SYRP: 5.0000 mL | ORAL_SOLUTION | Freq: Three times a day (TID) | ORAL | Status: DC | PRN

## 2015-12-31 MED ORDER — BENZONATATE 100 MG PO CAPS: 100.0000 mg | ORAL_CAPSULE | Freq: Two times a day (BID) | ORAL | Status: DC | PRN

## 2015-12-31 MED ORDER — AZITHROMYCIN 250 MG PO TABS: ORAL_TABLET | ORAL | Status: DC

## 2015-12-31 NOTE — Progress Notes (Signed)
Pre visit review using our clinic review tool, if applicable. No additional management support is needed unless otherwise documented below in the visit note. 

## 2015-12-31 NOTE — Progress Notes (Signed)
Subjective:    Patient ID: Stephanie Schwartz, female    DOB: [birth year], 50 y.o.   MRN: [id number]  Chief Complaint  Patient presents with  . Cough    x1 week, sore throat and productive cough    HPI:  Stephanie Schwartz is a 50 y.o. female who  has a past medical history of SHINGLES (06/07/2008); HYPERCHOLESTEROLEMIA (08/21/2007); OTITIS MEDIA, SEROUS, ACUTE, LEFT (03/15/2008); ASTHMATIC BRONCHITIS, ACUTE (10/14/2008); ALLERGIC RHINITIS (03/01/2008); Wheezing (10/14/2009); Anxiety (02/25/2012); Heart murmur; ASTHMA; and HYPOTHYROIDISM. and presents today for an acute office visit.    This is a new problem. Associated symptom fo a cough and sore throat have been going on for about 1 week. Denies fevers. Modifying factors include ibuprofen which has not helped the cough. Describes increased congestion this morning. No recent antibiotic use. Over the course of the week she has progressively worsened.   No Known Allergies   Current Outpatient Prescriptions on File Prior to Visit  Medication Sig Dispense Refill  . ALPRAZolam (XANAX) 0.5 MG tablet Take 1 tablet (0.5 mg total) by mouth daily as needed for anxiety. 30 tablet 2  . ibuprofen (ADVIL,MOTRIN) 200 MG tablet Take 200 mg by mouth every 6 (six) hours as needed for mild pain or moderate pain.    Marland Kitchen loratadine-pseudoephedrine (CLARITIN-D 24-HOUR) 10-240 MG per 24 hr tablet Take 1 tablet by mouth daily.      . SYMBICORT 160-4.5 MCG/ACT inhaler INHALE 2 PUFFS INTO THE LUNGS 2 (TWO) TIMES DAILY. 10.2 g 10  . thyroid (ARMOUR) 180 MG tablet Take 180 mg by mouth daily.     No current facility-administered medications on file prior to visit.     Past Surgical History  Procedure Laterality Date  . Shoulder arthroscopy Left ~ 2009  . Appendectomy  12/02/2014    lap  . Reduction mammaplasty Bilateral ~ 1995  . Laparoscopic appendectomy N/A 12/02/2014    Procedure: APPENDECTOMY LAPAROSCOPIC;  Surgeon: Ralene Ok, MD;  Location: [place];  Service:  General;  Laterality: N/A;     Review of Systems  Constitutional: Negative for fever and chills.  HENT: Positive for congestion and sore throat. Negative for ear pain and sinus pressure.   Respiratory: Positive for cough. Negative for chest tightness and shortness of breath.   Neurological: Positive for headaches.      Objective:    BP 114/80 mmHg  Pulse 73  Temp(Src) 98.1 F (36.7 C) (Oral)  Resp 16  Ht 5\' 9"  (1.753 m)  Wt 180 lb (81.647 kg)  BMI 26.57 kg/m2  SpO2 98% Nursing note and vital signs reviewed.  Physical Exam  Constitutional: She is oriented to person, place, and time. She appears well-developed and well-nourished. No distress.  HENT:  Right Ear: Hearing, tympanic membrane, external ear and ear canal normal.  Left Ear: Hearing, tympanic membrane, external ear and ear canal normal.  Nose: Nose normal. Right sinus exhibits no maxillary sinus tenderness and no frontal sinus tenderness. Left sinus exhibits no maxillary sinus tenderness and no frontal sinus tenderness.  Mouth/Throat: Uvula is midline, oropharynx is clear and moist and mucous membranes are normal.  Cardiovascular: Normal rate, regular rhythm, normal heart sounds and intact distal pulses.   Pulmonary/Chest: Effort normal and breath sounds normal.  Neurological: She is alert and oriented to person, place, and time.  Skin: Skin is warm and dry.  Psychiatric: She has a normal mood and affect. Her behavior is normal. Judgment and thought content normal.  Assessment & Plan:   Problem List Items Addressed This Visit      Respiratory   Acute upper respiratory infection - Primary    Symptoms and exam consistent with acute upper respiratory infection most likely viral. Treat conservatively with over-the-counter medications as needed for symptom relief and supportive care. Start Hycodan as needed for cough and sleep. Start Tessalon as needed for cough. Written prescription for azithromycin provided with  instructions for watchful waiting if symptoms worsen. Follow-up if symptoms worsen or fail to improve if antibiotic needed.      Relevant Medications   azithromycin (ZITHROMAX) 250 MG tablet   HYDROcodone-homatropine (HYCODAN) 5-1.5 MG/5ML syrup   benzonatate (TESSALON) 100 MG capsule

## 2015-12-31 NOTE — Patient Instructions (Signed)
Thank you for choosing [facility].  Summary/Instructions:  Your prescription(s) have been submitted to your pharmacy or been printed and provided for you. Please take as directed and contact our office if you believe you are having problem(s) with the medication(s) or have any questions.  If your symptoms worsen or fail to improve, please contact our office for further instruction, or in case of emergency go directly to the emergency room at the closest medical facility.    General Recommendations:    Please drink plenty of fluids.  Get plenty of rest   Sleep in humidified air  Use saline nasal sprays  Netti pot   OTC Medications:  Decongestants - helps relieve congestion   Flonase (generic fluticasone) or Nasacort (generic triamcinolone) - please make sure to use the "cross-over" technique at a 45 degree angle towards the opposite eye as opposed to straight up the nasal passageway.   Sudafed (generic pseudoephedrine - Note this is the one that is available behind the pharmacy counter); Products with phenylephrine (-PE) may also be used but is often not as effective as pseudoephedrine.   If you have HIGH BLOOD PRESSURE - Coricidin HBP; AVOID any product that is -D as this contains pseudoephedrine which may increase your blood pressure.  Afrin (oxymetazoline) every 6-8 hours for up to 3 days.   Allergies - helps relieve runny nose, itchy eyes and sneezing   Claritin (generic loratidine), Allegra (fexofenidine), or Zyrtec (generic cyrterizine) for runny nose. These medications should not cause drowsiness.  Note - Benadryl (generic diphenhydramine) may be used however may cause drowsiness  Cough -   Delsym or Robitussin (generic dextromethorphan)  Expectorants - helps loosen mucus to ease removal   Mucinex (generic guaifenesin) as directed on the package.  Headaches / General Aches   Tylenol (generic acetaminophen) - DO NOT EXCEED 3 grams (3,000 mg) in a 24  hour time period  Advil/Motrin (generic ibuprofen)   Sore Throat -   Salt water gargle   Chloraseptic (generic benzocaine) spray or lozenges / Sucrets (generic dyclonine)    Upper Respiratory Infection, Adult Most upper respiratory infections (URIs) are a viral infection of the air passages leading to the lungs. A URI affects the nose, throat, and upper air passages. The most common type of URI is nasopharyngitis and is typically referred to as "the common cold." URIs run their course and usually go away on their own. Most of the time, a URI does not require medical attention, but sometimes a bacterial infection in the upper airways can follow a viral infection. This is called a secondary infection. Sinus and middle ear infections are common types of secondary upper respiratory infections. Bacterial pneumonia can also complicate a URI. A URI can worsen asthma and chronic obstructive pulmonary disease (COPD). Sometimes, these complications can require emergency medical care and may be life threatening.  CAUSES Almost all URIs are caused by viruses. A virus is a type of germ and can spread from one person to another.  RISKS FACTORS You may be at risk for a URI if:   You smoke.   You have chronic heart or lung disease.  You have a weakened defense (immune) system.   You are very young or very old.   You have nasal allergies or asthma.  You work in crowded or poorly ventilated areas.  You work in health care facilities or schools. SIGNS AND SYMPTOMS  Symptoms typically develop 2-3 days after you come in contact with a cold virus. Most   viral URIs last 7-10 days. However, viral URIs from the influenza virus (flu virus) can last 14-18 days and are typically more severe. Symptoms may include:   Runny or stuffy (congested) nose.   Sneezing.   Cough.   Sore throat.   Headache.   Fatigue.   Fever.   Loss of appetite.   Pain in your forehead, behind your eyes, and  over your cheekbones (sinus pain).  Muscle aches.  DIAGNOSIS  Your health care provider may diagnose a URI by:  Physical exam.  Tests to check that your symptoms are not due to another condition such as:  Strep throat.  Sinusitis.  Pneumonia.  Asthma. TREATMENT  A URI goes away on its own with time. It cannot be cured with medicines, but medicines may be prescribed or recommended to relieve symptoms. Medicines may help:  Reduce your fever.  Reduce your cough.  Relieve nasal congestion. HOME CARE INSTRUCTIONS   Take medicines only as directed by your health care provider.   Gargle warm saltwater or take cough drops to comfort your throat as directed by your health care provider.  Use a warm mist humidifier or inhale steam from a shower to increase air moisture. This may make it easier to breathe.  Drink enough fluid to keep your urine clear or pale yellow.   Eat soups and other clear broths and maintain good nutrition.   Rest as needed.   Return to work when your temperature has returned to normal or as your health care provider advises. You may need to stay home longer to avoid infecting others. You can also use a face mask and careful hand washing to prevent spread of the virus.  Increase the usage of your inhaler if you have asthma.   Do not use any tobacco products, including cigarettes, chewing tobacco, or electronic cigarettes. If you need help quitting, ask your health care provider. PREVENTION  The best way to protect yourself from getting a cold is to practice good hygiene.   Avoid oral or hand contact with people with cold symptoms.   Wash your hands often if contact occurs.  There is no clear evidence that vitamin C, vitamin E, echinacea, or exercise reduces the chance of developing a cold. However, it is always recommended to get plenty of rest, exercise, and practice good nutrition.  SEEK MEDICAL CARE IF:   You are getting worse rather than  better.   Your symptoms are not controlled by medicine.   You have chills.  You have worsening shortness of breath.  You have brown or red mucus.  You have yellow or brown nasal discharge.  You have pain in your face, especially when you bend forward.  You have a fever.  You have swollen neck glands.  You have pain while swallowing.  You have white areas in the back of your throat. SEEK IMMEDIATE MEDICAL CARE IF:   You have severe or persistent:  Headache.  Ear pain.  Sinus pain.  Chest pain.  You have chronic lung disease and any of the following:  Wheezing.  Prolonged cough.  Coughing up blood.  A change in your usual mucus.  You have a stiff neck.  You have changes in your:  Vision.  Hearing.  Thinking.  Mood. MAKE SURE YOU:   Understand these instructions.  Will watch your condition.  Will get help right away if you are not doing well or get worse.   This information is not intended to replace advice   given to you by your health care provider. Make sure you discuss any questions you have with your health care provider.   Document Released: 04/13/2001 Document Revised: 03/04/2015 Document Reviewed: 01/23/2014 Elsevier Interactive Patient Education 2016 Elsevier Inc.  

## 2015-12-31 NOTE — Assessment & Plan Note (Addendum)
Symptoms and exam consistent with acute upper respiratory infection most likely viral. Treat conservatively with over-the-counter medications as needed for symptom relief and supportive care. Start Hycodan as needed for cough and sleep. Start Tessalon as needed for cough. Written prescription for azithromycin provided with instructions for watchful waiting if symptoms worsen. Follow-up if symptoms worsen or fail to improve if antibiotic needed.

## 2016-01-09 ENCOUNTER — Ambulatory Visit (INDEPENDENT_AMBULATORY_CARE_PROVIDER_SITE_OTHER): Payer: 59 | Admitting: Internal Medicine

## 2016-01-09 ENCOUNTER — Encounter: Payer: Self-pay | Admitting: Internal Medicine

## 2016-01-09 VITALS — BP 118/72 | HR 74 | Temp 99.3°F | Resp 20 | Wt 180.0 lb

## 2016-01-09 DIAGNOSIS — R05 Cough: Secondary | ICD-10-CM | POA: Diagnosis not present

## 2016-01-09 DIAGNOSIS — J069 Acute upper respiratory infection, unspecified: Secondary | ICD-10-CM

## 2016-01-09 DIAGNOSIS — J209 Acute bronchitis, unspecified: Secondary | ICD-10-CM | POA: Diagnosis not present

## 2016-01-09 DIAGNOSIS — R062 Wheezing: Secondary | ICD-10-CM

## 2016-01-09 DIAGNOSIS — R059 Cough, unspecified: Secondary | ICD-10-CM

## 2016-01-09 DIAGNOSIS — F419 Anxiety disorder, unspecified: Secondary | ICD-10-CM | POA: Diagnosis not present

## 2016-01-09 MED ORDER — PREDNISONE 10 MG PO TABS: ORAL_TABLET | ORAL | Status: DC

## 2016-01-09 MED ORDER — LEVOFLOXACIN 500 MG PO TABS: 500.0000 mg | ORAL_TABLET | Freq: Every day | ORAL | Status: DC

## 2016-01-09 MED ORDER — HYDROCODONE-HOMATROPINE 5-1.5 MG/5ML PO SYRP
5.0000 mL | ORAL_SOLUTION | Freq: Three times a day (TID) | ORAL | Status: DC | PRN
Start: 2016-01-09 — End: 2016-08-03

## 2016-01-09 NOTE — Progress Notes (Signed)
Subjective:    Patient ID: Stephanie Schwartz, female    DOB: [birth year], 50 y.o.   MRN: [id number]  HPI  Here with acute onset mild to mod 2-3 days ST, HA, general weakness and malaise, with prod cough greenish sputum, but Pt denies chest pain, increased sob or doe, wheezing, orthopnea, PND, increased LE swelling, palpitations, dizziness or syncope, excpet for onset mild sob and wheezing last PM, using inhaler several times today.  Did have URI 2 wks ago, now resolved,  Denies worsening depressive symptoms, suicidal ideation, or panic Past Medical History  Diagnosis Date  . SHINGLES 06/07/2008  . HYPERCHOLESTEROLEMIA 08/21/2007  . OTITIS MEDIA, SEROUS, ACUTE, LEFT 03/15/2008  . ASTHMATIC BRONCHITIS, ACUTE 10/14/2008  . ALLERGIC RHINITIS 03/01/2008  . Wheezing 10/14/2009  . Anxiety 02/25/2012  . Heart murmur     "maybe" (12/02/2014)  . ASTHMA     "allergy induced"  . HYPOTHYROIDISM    Past Surgical History  Procedure Laterality Date  . Shoulder arthroscopy Left ~ 2009  . Appendectomy  12/02/2014    lap  . Reduction mammaplasty Bilateral ~ 1995  . Laparoscopic appendectomy N/A 12/02/2014    Procedure: APPENDECTOMY LAPAROSCOPIC;  Surgeon: Ralene Ok, MD;  Location: [facility];  Service: General;  Laterality: N/A;    reports that she has never smoked. She has never used smokeless tobacco. She reports that she drinks about 8.4 oz of alcohol per week. She reports that she does not use illicit drugs. family history includes Alcohol abuse in her other; Cancer in her other; Diabetes in her other; Hyperlipidemia in her other; Hypertension in her other. No Known Allergies Current Outpatient Prescriptions on File Prior to Visit  Medication Sig Dispense Refill  . ALPRAZolam (XANAX) 0.5 MG tablet Take 1 tablet (0.5 mg total) by mouth daily as needed for anxiety. 30 tablet 2  . benzonatate (TESSALON) 100 MG capsule Take 1 capsule (100 mg total) by mouth 2 (two) times daily as needed for cough. 20 capsule 0   . ibuprofen (ADVIL,MOTRIN) 200 MG tablet Take 200 mg by mouth every 6 (six) hours as needed for mild pain or moderate pain.    Marland Kitchen loratadine-pseudoephedrine (CLARITIN-D 24-HOUR) 10-240 MG per 24 hr tablet Take 1 tablet by mouth daily.      . SYMBICORT 160-4.5 MCG/ACT inhaler INHALE 2 PUFFS INTO THE LUNGS 2 (TWO) TIMES DAILY. 10.2 g 10  . thyroid (ARMOUR) 180 MG tablet Take 180 mg by mouth daily.    . zaleplon (SONATA) 5 MG capsule Take 1 capsule (5 mg total) by mouth at bedtime as needed for sleep. 30 capsule 0   No current facility-administered medications on file prior to visit.   Review of Systems  Constitutional: Negative for unusual diaphoresis or night sweats HENT: Negative for ringing in ear or discharge Eyes: Negative for double vision or worsening visual disturbance.  Respiratory: Negative for choking and stridor.   Gastrointestinal: Negative for vomiting or other signifcant bowel change Genitourinary: Negative for hematuria or change in urine volume.  Musculoskeletal: Negative for other MSK pain or swelling Skin: Negative for color change and worsening wound.  Neurological: Negative for tremors and numbness other than noted  Psychiatric/Behavioral: Negative for decreased concentration or agitation other than above       Objective:   Physical Exam BP 118/72 mmHg  Pulse 74  Temp(Src) 99.3 F (37.4 C) (Oral)  Resp 20  Wt 180 lb (81.647 kg)  SpO2 94% VS noted, mild ill Constitutional: Pt appears  in no significant distress HENT: Head: NCAT.  Right Ear: External ear normal.  Left Ear: External ear normal.  Bilat tm's with mild erythema.  Max sinus areas non tender.  Pharynx with mild erythema, no exudate Eyes: . Pupils are equal, round, and reactive to light. Conjunctivae and EOM are normal Neck: Normal range of motion. Neck supple.  Cardiovascular: Normal rate and regular rhythm.   Pulmonary/Chest: Effort normal and breath sounds decresaed bilat without rales but with few  bilat wheezing.  Abd:  Soft, NT, ND, + BS Neurological: Pt is alert. Not confused , motor grossly intact Skin: Skin is warm. No rash, no LE edema Psychiatric: Pt behavior is normal. No agitation.     Assessment & Plan:

## 2016-01-09 NOTE — Patient Instructions (Signed)
Please take all new medication as prescribed - the antibiotic, cough medicine, and prednisone ° °Please continue all other medications as before, and refills have been done if requested. ° °Please have the pharmacy call with any other refills you may need. ° °Please keep your appointments with your specialists as you may have planned ° ° ° °

## 2016-01-09 NOTE — Assessment & Plan Note (Signed)
stable overall by history and exam, recent data reviewed with pt, and pt to continue medical treatment as before,  to f/u any worsening symptoms or concerns Lab Results  Component Value Date   WBC 5.7 07/03/2015   HGB 13.7 07/03/2015   HCT 40.2 07/03/2015   PLT 303.0 07/03/2015   GLUCOSE 93 07/03/2015   CHOL 214* 07/03/2015   TRIG 280.0* 07/03/2015   HDL 53.00 07/03/2015   LDLDIRECT 133.0 07/03/2015   ALT 30 07/03/2015   AST 21 07/03/2015   NA 136 07/03/2015   K 4.4 07/03/2015   CL 105 07/03/2015   CREATININE 0.72 07/03/2015   BUN 17 07/03/2015   CO2 23 07/03/2015   TSH 0.66 07/03/2015

## 2016-01-09 NOTE — Assessment & Plan Note (Signed)
Mild to mod, c/w bronchitis vs pna, declines cxr, for antibx,  Cough med prn,  to f/u any worsening symptoms or concerns

## 2016-01-09 NOTE — Assessment & Plan Note (Signed)
Mild, c/w bronchospastic type, for predpac asd,  Cont inhaler prn, to f/u any worsening symptoms or concerns

## 2016-01-09 NOTE — Progress Notes (Signed)
Pre visit review using our clinic review tool, if applicable. No additional management support is needed unless otherwise documented below in the visit note. 

## 2016-03-14 ENCOUNTER — Other Ambulatory Visit: Payer: Self-pay | Admitting: Internal Medicine

## 2016-03-15 ENCOUNTER — Ambulatory Visit (HOSPITAL_COMMUNITY)
Admission: EM | Admit: 2016-03-15 | Discharge: 2016-03-15 | Disposition: A | Discharge status: Home / Self Care | Payer: 59 | Attending: Family Medicine | Admitting: Family Medicine

## 2016-03-15 ENCOUNTER — Encounter (HOSPITAL_COMMUNITY): Payer: Self-pay | Admitting: Emergency Medicine

## 2016-03-15 DIAGNOSIS — F419 Anxiety disorder, unspecified: Secondary | ICD-10-CM | POA: Insufficient documentation

## 2016-03-15 DIAGNOSIS — E7800 Pure hypercholesterolemia, unspecified: Secondary | ICD-10-CM | POA: Insufficient documentation

## 2016-03-15 DIAGNOSIS — E039 Hypothyroidism, unspecified: Secondary | ICD-10-CM | POA: Diagnosis not present

## 2016-03-15 DIAGNOSIS — A09 Infectious gastroenteritis and colitis, unspecified: Secondary | ICD-10-CM | POA: Diagnosis not present

## 2016-03-15 DIAGNOSIS — R109 Unspecified abdominal pain: Secondary | ICD-10-CM | POA: Diagnosis not present

## 2016-03-15 DIAGNOSIS — Z7951 Long term (current) use of inhaled steroids: Secondary | ICD-10-CM | POA: Insufficient documentation

## 2016-03-15 DIAGNOSIS — Z79899 Other long term (current) drug therapy: Secondary | ICD-10-CM | POA: Diagnosis not present

## 2016-03-15 DIAGNOSIS — R197 Diarrhea, unspecified: Secondary | ICD-10-CM | POA: Diagnosis not present

## 2016-03-15 MED ORDER — ONDANSETRON HCL 4 MG PO TABS: 4.0000 mg | ORAL_TABLET | Freq: Four times a day (QID) | ORAL | Status: DC

## 2016-03-15 MED ORDER — DIPHENOXYLATE-ATROPINE 2.5-0.025 MG PO TABS: 1.0000 | ORAL_TABLET | Freq: Four times a day (QID) | ORAL | Status: DC | PRN

## 2016-03-15 NOTE — ED Notes (Signed)
Attempted to get a stool specimen, patient unable to do so at this time.

## 2016-03-15 NOTE — Discharge Instructions (Signed)
Diarrhea  Diarrhea is watery poop (stool). It can make you feel weak, tired, thirsty, or give you a dry mouth (signs of dehydration). Watery poop is a sign of another problem, most often an infection. It often lasts 2-3 days. It can last longer if it is a sign of something serious. Take care of yourself as told by your doctor.  HOME CARE   · Drink 1 cup (8 ounces) of fluid each time you have watery poop.  · Do not drink the following fluids:    Those that contain simple sugars (fructose, glucose, galactose, lactose, sucrose, maltose).    Sports drinks.    Fruit juices.    Whole milk products.    Sodas.    Drinks with caffeine (coffee, tea, soda) or alcohol.  · Oral rehydration solution may be used if the doctor says it is okay. You may make your own solution. Follow this recipe:    ?-? teaspoon table salt.    ¾ teaspoon baking soda.    ? teaspoon salt substitute containing potassium chloride.    1 ? tablespoons sugar.    1 liter (34 ounces) of water.  · Avoid the following foods:    High fiber foods, such as raw fruits and vegetables.    Nuts, seeds, and whole grain breads and cereals.     Those that are sweetened with sugar alcohols (xylitol, sorbitol, mannitol).  · Try eating the following foods:    Starchy foods, such as rice, toast, pasta, low-sugar cereal, oatmeal, baked potatoes, crackers, and bagels.    Bananas.    Applesauce.  · Eat probiotic-rich foods, such as yogurt and milk products that are fermented.  · Wash your hands well after each time you have watery poop.  · Only take medicine as told by your doctor.  · Take a warm bath to help lessen burning or pain from having watery poop.  GET HELP RIGHT AWAY IF:   · You cannot drink fluids without throwing up (vomiting).  · You keep throwing up.  · You have blood in your poop, or your poop looks black and tarry.  · You do not pee (urinate) in 6-8 hours, or there is only a small amount of very dark pee.  · You have belly (abdominal) pain that gets worse or  stays in the same spot (localizes).  · You are weak, dizzy, confused, or light-headed.  · You have a very bad headache.  · Your watery poop gets worse or does not get better.  · You have a fever or lasting symptoms for more than 2-3 days.  · You have a fever and your symptoms suddenly get worse.  MAKE SURE YOU:   · Understand these instructions.  · Will watch your condition.  · Will get help right away if you are not doing well or get worse.     This information is not intended to replace advice given to you by your health care provider. Make sure you discuss any questions you have with your health care provider.     Document Released: 04/05/2008 Document Revised: 11/08/2014 Document Reviewed: 06/25/2012  Elsevier Interactive Patient Education ©2016 Elsevier Inc.

## 2016-03-15 NOTE — ED Notes (Signed)
Patient provided with supplies for collecting stool at home.  Instructed to return  sample to ucc prior to 8:00pm.  Patient acknowledged instructions

## 2016-03-15 NOTE — ED Notes (Signed)
Patient reports having gone to [place].  Patient has had waves of diarrhea and nausea for 1 1/2 weeks.  Abdomen feels sore.  Patient does report abdominal pain and an episode of vomiting yesterday.

## 2016-03-15 NOTE — ED Provider Notes (Signed)
CSN: [id number]     Arrival date & time 03/15/16  1310 History   First MD Initiated Contact with Patient 03/15/16 1342     No chief complaint on file.  (Consider location/radiation/quality/duration/timing/severity/associated sxs/prior Treatment) HPI History obtained from patient:  Pt presents with the cc of: abdominal Discomfort and diarrhea Duration of symptoms: Since May 5 Treatment prior to arrival: Fluids Context: She was in grease return to the [place] May 5 developed diarrhea. She states that she continues to have daily diarrhea no blood in her stools. Has not affected her appetite or her activity level. Other symptoms include: Abdominal discomfort Pain score: 1 FAMILY HISTORY: Other with history of diabetes and diverticulitis father with prostate cancer SOCIAL HISTORY: Nonsmoker, history alcohol use  Past Medical History  Diagnosis Date  . SHINGLES 06/07/2008  . HYPERCHOLESTEROLEMIA 08/21/2007  . OTITIS MEDIA, SEROUS, ACUTE, LEFT 03/15/2008  . ASTHMATIC BRONCHITIS, ACUTE 10/14/2008  . ALLERGIC RHINITIS 03/01/2008  . Wheezing 10/14/2009  . Anxiety 02/25/2012  . Heart murmur     "maybe" (12/02/2014)  . ASTHMA     "allergy induced"  . HYPOTHYROIDISM    Past Surgical History  Procedure Laterality Date  . Shoulder arthroscopy Left ~ 2009  . Appendectomy  12/02/2014    lap  . Reduction mammaplasty Bilateral ~ 1995  . Laparoscopic appendectomy N/A 12/02/2014    Procedure: APPENDECTOMY LAPAROSCOPIC;  Surgeon: Ralene Ok, MD;  Location: [facility];  Service: General;  Laterality: N/A;   Family History  Problem Relation Age of [place]  . Alcohol abuse Other     ETOH  . Cancer Other     Breast Cancer  . Hypertension Other   . Hyperlipidemia Other   . Diabetes Other    Social History  Substance Use Topics  . Smoking status: Never Smoker   . Smokeless tobacco: Never Used  . Alcohol Use: 8.4 oz/week    14 Glasses of wine per week   OB History    No data available      Review of Systems ROS +'ve Abdominal discomfort, diarrhrea  Denies: HEADACHE, NAUSEA,  CHEST PAIN, CONGESTION, DYSURIA, SHORTNESS OF BREATH  Allergies  Review of patient's allergies indicates no known allergies.  Home Medications   Prior to Admission medications   Medication Sig Start Date End Date Taking? Authorizing Provider  ALPRAZolam Duanne Moron) 0.5 MG tablet Take 1 tablet (0.5 mg total) by mouth daily as needed for anxiety. 12/17/15   Biagio Borg, MD  benzonatate (TESSALON) 100 MG capsule Take 1 capsule (100 mg total) by mouth 2 (two) times daily as needed for cough. 12/31/15   Golden Circle, FNP  HYDROcodone-homatropine [facility]) 5-1.5 MG/5ML syrup Take 5 mLs by mouth every 8 (eight) hours as needed for cough. 01/09/16   Biagio Borg, MD  ibuprofen (ADVIL,MOTRIN) 200 MG tablet Take 200 mg by mouth every 6 (six) hours as needed for mild pain or moderate pain.    Historical Provider, MD  levofloxacin (LEVAQUIN) 500 MG tablet Take 1 tablet (500 mg total) by mouth daily. 01/09/16   Biagio Borg, MD  loratadine-pseudoephedrine (CLARITIN-D 24-HOUR) 10-240 MG per 24 hr tablet Take 1 tablet by mouth daily.      Historical Provider, MD  predniSONE (DELTASONE) 10 MG tablet 3 tabs by mouth per day for 3 days,2tabs per day for 3 days,1tab per day for 3 days 01/09/16   Biagio Borg, MD  SYMBICORT 160-4.5 MCG/ACT inhaler INHALE 2 PUFFS INTO THE LUNGS 2 (  TWO) TIMES DAILY. 08/10/13   Biagio Borg, MD  thyroid (ARMOUR) 180 MG tablet Take 180 mg by mouth daily.    Historical Provider, MD  zaleplon (SONATA) 5 MG capsule Take 1 capsule (5 mg total) by mouth at bedtime as needed for sleep. 12/31/15   Biagio Borg, MD   Meds Ordered and Administered this Visit  Medications - No data to display  BP 137/78 mmHg  Pulse 83  Temp(Src) 98.4 F (36.9 C) (Oral)  Resp 14  SpO2 99%  LMP 02/24/2016 No data found.   Physical Exam NURSES NOTES AND VITAL SIGNS REVIEWED. CONSTITUTIONAL: Well developed, well  nourished, no acute distress, well-hydrated HEENT: normocephalic, atraumatic EYES: Conjunctiva normal NECK:normal ROM, supple, no adenopathy PULMONARY:No respiratory distress, normal effort ABDOMINAL: Soft, ND, NT BS+, No CVAT MUSCULOSKELETAL: Normal ROM of all extremities,  SKIN: warm and dry without rash PSYCHIATRIC: Mood and affect, behavior are normal  ED Course  Procedures (including critical care time)  Labs Review Labs Reviewed - No data to display  Imaging Review No results found.   Visual Acuity Review  Right Eye Distance:   Left Eye Distance:   Bilateral Distance:    Right Eye Near:   Left Eye Near:    Bilateral Near:       Prescriptions for Lomotil and Zofran  MDM   1. Traveler's diarrhea    New Problem   Patient is reassured that there are no issues that require transfer to higher level of care at this time or additional tests. Patient is advised to continue home symptomatic treatment. Patient is advised that if there are new or worsening symptoms to attend the emergency department, contact primary care provider, or return to UC. Instructions of care provided discharged home in stable condition.    THIS NOTE WAS GENERATED USING A VOICE RECOGNITION SOFTWARE PROGRAM. ALL REASONABLE EFFORTS  WERE MADE TO PROOFREAD THIS DOCUMENT FOR ACCURACY.  I have verbally reviewed the discharge instructions with the patient. A printed AVS was given to the patient.  All questions were answered prior to discharge.      Konrad Felix, PA 03/15/16 1357

## 2016-03-16 ENCOUNTER — Other Ambulatory Visit: Payer: Self-pay | Admitting: Internal Medicine

## 2016-03-16 NOTE — Telephone Encounter (Signed)
Done hardcopy to Corinne  

## 2016-03-16 NOTE — Telephone Encounter (Signed)
Medication faxed to pharmacy 

## 2016-03-17 LAB — GASTROINTESTINAL PANEL BY PCR, STOOL (REPLACES STOOL CULTURE)
ADENOVIRUS F40/41: NOT DETECTED
Astrovirus: NOT DETECTED
CAMPYLOBACTER SPECIES: NOT DETECTED
CRYPTOSPORIDIUM: NOT DETECTED
CYCLOSPORA CAYETANENSIS: NOT DETECTED
E. coli O157: NOT DETECTED
ENTAMOEBA HISTOLYTICA: NOT DETECTED
ENTEROAGGREGATIVE E COLI (EAEC): NOT DETECTED
ENTEROPATHOGENIC E COLI (EPEC): NOT DETECTED
Enterotoxigenic E coli (ETEC): NOT DETECTED
GIARDIA LAMBLIA: DETECTED — AB
Norovirus GI/GII: NOT DETECTED
PLESIMONAS SHIGELLOIDES: NOT DETECTED
Rotavirus A: NOT DETECTED
SALMONELLA SPECIES: NOT DETECTED
SHIGELLA/ENTEROINVASIVE E COLI (EIEC): NOT DETECTED
Sapovirus (I, II, IV, and V): NOT DETECTED
Shiga like toxin producing E coli (STEC): NOT DETECTED
VIBRIO CHOLERAE: NOT DETECTED
VIBRIO SPECIES: NOT DETECTED
YERSINIA ENTEROCOLITICA: NOT DETECTED

## 2016-03-17 MED ORDER — METRONIDAZOLE 500 MG PO TABS: 500.0000 mg | ORAL_TABLET | Freq: Two times a day (BID) | ORAL | Status: DC

## 2016-03-18 ENCOUNTER — Telehealth (HOSPITAL_COMMUNITY): Payer: Self-pay | Admitting: Emergency Medicine

## 2016-03-18 NOTE — ED Notes (Signed)
LM on pt's VM [phone] Need to give lab results from recent visit on 5/15 Also let pt know labs can be obtained from [facility] and that Rx has been sent to [facility])  Per Dr. Bridgett Larsson,  Please let patient know her stool was positive for giardia. This is a common cause of traveler's diarrhea. I have electronically prescribed Flagyl 500mg  BID x7 to her pharmacy on file

## 2016-03-19 NOTE — ED Notes (Signed)
LM on pt's VM [phone] Need to give lab results from recent visit on 5/15 Also let pt know labs can be obtained from [facility] and that Rx has been sent to pharmacy [facility])  Per Dr. Bridgett Larsson,  Please let patient know her stool was positive for giardia. This is a common cause of traveler's diarrhea. I have electronically prescribed Flagyl 500mg  BID x7 to

## 2016-03-23 NOTE — ED Notes (Signed)
Called pt and notified of recent lab results from visit 5/15 Pt ID'd properly... Reports feeling better and sx have subsided 2 days after visit at [place] she has p/u RX and is on day 5  Per Dr. Valere Dross,  Please let patient know her stool was positive for giardia. This is a common cause of traveler's diarrhea. I have electronically prescribed Flagyl 500mg  BID x7 to her pharmacy on file.  Adv pt if sx are not getting better to return  Pt verb understanding

## 2016-06-08 ENCOUNTER — Other Ambulatory Visit: Payer: Self-pay | Admitting: Internal Medicine

## 2016-07-11 ENCOUNTER — Other Ambulatory Visit: Payer: Self-pay | Admitting: Internal Medicine

## 2016-07-13 NOTE — Telephone Encounter (Signed)
Done erx 

## 2016-08-03 ENCOUNTER — Ambulatory Visit (INDEPENDENT_AMBULATORY_CARE_PROVIDER_SITE_OTHER): Payer: 59 | Admitting: Internal Medicine

## 2016-08-03 VITALS — BP 122/76 | HR 67 | Temp 98.0°F | Resp 20 | Wt 181.4 lb

## 2016-08-03 DIAGNOSIS — Z0000 Encounter for general adult medical examination without abnormal findings: Secondary | ICD-10-CM

## 2016-08-03 DIAGNOSIS — E039 Hypothyroidism, unspecified: Secondary | ICD-10-CM

## 2016-08-03 DIAGNOSIS — Z23 Encounter for immunization: Secondary | ICD-10-CM

## 2016-08-03 DIAGNOSIS — F419 Anxiety disorder, unspecified: Secondary | ICD-10-CM

## 2016-08-03 DIAGNOSIS — E7800 Pure hypercholesterolemia, unspecified: Secondary | ICD-10-CM

## 2016-08-03 MED ORDER — ALPRAZOLAM 0.5 MG PO TABS: ORAL_TABLET | ORAL | 5 refills | Status: DC

## 2016-08-03 NOTE — Patient Instructions (Addendum)
You had the flu shot today  Please continue all other medications as before, and refills have been done if requested.  Please have the pharmacy call with any other refills you may need.  Please continue your efforts at being more active, low cholesterol diet, and weight control.  You are otherwise up to date with prevention measures today.  Please keep your appointments with your specialists as you may have planned  Please go to the LAB in the Basement (turn left off the elevator) for the tests to be done at your convenience  Please remember to sign up for MyChart if you have not done so, as this will be important to you in the future with finding out test results, communicating by private email, and scheduling acute appointments online when needed.  Please return in 1 year for your yearly visit, or sooner if needed, with Lab testing done 3-5 days before

## 2016-08-03 NOTE — Progress Notes (Signed)
Pre visit review using our clinic review tool, if applicable. No additional management support is needed unless otherwise documented below in the visit note. 

## 2016-08-03 NOTE — Progress Notes (Signed)
Subjective:    Patient ID: Stephanie Schwartz, female    DOB: Mar 26, 1966, 50 y.o.   MRN: [id number]  HPI  Here for wellness and f/u;  Overall doing ok;  Pt denies Chest pain, worsening SOB, DOE, wheezing, orthopnea, PND, worsening LE edema, palpitations, dizziness or syncope.  Pt denies neurological change such as new headache, facial or extremity weakness.  Pt denies polydipsia, polyuria, or low sugar symptoms. Pt states overall good compliance with treatment and medications, good tolerability, and has been trying to follow appropriate diet.  Pt denies worsening depressive symptoms, suicidal ideation or panic. No fever, night sweats, wt loss, loss of appetite, or other constitutional symptoms.  Pt states good ability with ADL's, has low fall risk, home safety reviewed and adequate, no other significant changes in hearing or vision, and active with exercise and nearly daily with wt lifting 4 days per wk and crossfit. Wt Readings from Last 3 Encounters:  08/03/16 181 lb 6 oz (82.3 kg)  01/09/16 180 lb (81.6 kg)  12/31/15 180 lb (81.6 kg)   Past Medical History:  Diagnosis Date  . ALLERGIC RHINITIS 03/01/2008  . Anxiety 02/25/2012  . ASTHMA    "allergy induced"  . ASTHMATIC BRONCHITIS, ACUTE 10/14/2008  . Heart murmur    "maybe" (12/02/2014)  . HYPERCHOLESTEROLEMIA 08/21/2007  . HYPOTHYROIDISM   . OTITIS MEDIA, SEROUS, ACUTE, LEFT 03/15/2008  . SHINGLES 06/07/2008  . Wheezing 10/14/2009   Past Surgical History:  Procedure Laterality Date  . APPENDECTOMY  12/02/2014   lap  . LAPAROSCOPIC APPENDECTOMY N/A 12/02/2014   Procedure: APPENDECTOMY LAPAROSCOPIC;  Surgeon: Ralene Ok, MD;  Location: [place];  Service: General;  Laterality: N/A;  . REDUCTION MAMMAPLASTY Bilateral ~ 1995  . SHOULDER ARTHROSCOPY Left ~ 2009    reports that she has never smoked. She has never used smokeless tobacco. She reports that she drinks about 8.4 oz of alcohol per week . She reports that she does not use  drugs. family history includes Alcohol abuse in her other; Cancer in her other; Diabetes in her other; Hyperlipidemia in her other; Hypertension in her other. No Known Allergies Current Outpatient Prescriptions on File Prior to Visit  Medication Sig Dispense Refill  . escitalopram (LEXAPRO) 10 MG tablet TAKE ONE TABLET BY MOUTH DAILY 90 tablet 2  . ibuprofen (ADVIL,MOTRIN) 200 MG tablet Take 200 mg by mouth every 6 (six) hours as needed for mild pain or moderate pain.    Marland Kitchen loratadine-pseudoephedrine (CLARITIN-D 24-HOUR) 10-240 MG per 24 hr tablet Take 1 tablet by mouth daily.      . ondansetron (ZOFRAN) 4 MG tablet Take 1 tablet (4 mg total) by mouth every 6 (six) hours. 12 tablet 0  . SYMBICORT 160-4.5 MCG/ACT inhaler INHALE 2 PUFFS INTO THE LUNGS 2 (TWO) TIMES DAILY. 10.2 g 10  . thyroid (ARMOUR) 180 MG tablet Take 180 mg by mouth daily.    . zaleplon (SONATA) 5 MG capsule Take 1 capsule (5 mg total) by mouth at bedtime as needed for sleep. 30 capsule 0   No current facility-administered medications on file prior to visit.    Review of Systems Constitutional: Negative for increased diaphoresis, or other activity, appetite or siginficant weight change other than noted HENT: Negative for worsening hearing loss, ear pain, facial swelling, mouth sores and neck stiffness.   Eyes: Negative for other worsening pain, redness or visual disturbance.  Respiratory: Negative for choking or stridor Cardiovascular: Negative for other chest pain and palpitations.  Gastrointestinal:  Negative for worsening diarrhea, blood in stool, or abdominal distention Genitourinary: Negative for hematuria, flank pain or change in urine volume.  Musculoskeletal: Negative for myalgias or other joint complaints.  Skin: Negative for other color change and wound or drainage.  Neurological: Negative for syncope and numbness. other than noted Hematological: Negative for adenopathy. or other swelling Psychiatric/Behavioral:  Negative for hallucinations, SI, self-injury, decreased concentration or other worsening agitation.      Objective:   Physical Exam BP 122/76   Pulse 67   Temp 98 F (36.7 C) (Oral)   Resp 20   Wt 181 lb 6 oz (82.3 kg)   SpO2 99%   BMI 26.78 kg/m  VS noted, mild obese Constitutional: Pt is oriented to person, place, and time. Appears well-developed and well-nourished, in no significant distress Head: Normocephalic and atraumatic  Eyes: Conjunctivae and EOM are normal. Pupils are equal, round, and reactive to light Right Ear: External ear normal.  Left Ear: External ear normal Nose: Nose normal.  Mouth/Throat: Oropharynx is clear and moist  Neck: Normal range of motion. Neck supple. No JVD present. No tracheal deviation present or significant neck LA or mass Cardiovascular: Normal rate, regular rhythm, normal heart sounds and intact distal pulses.   Pulmonary/Chest: Effort normal and breath sounds without rales or wheezing  Abdominal: Soft. Bowel sounds are normal. NT. No HSM  Musculoskeletal: Normal range of motion. Exhibits no edema Lymphadenopathy: Has no cervical adenopathy.  Neurological: Pt is alert and oriented to person, place, and time. Pt has normal reflexes. No cranial nerve deficit. Motor grossly intact Skin: Skin is warm and dry. No rash noted or new ulcers Psychiatric:  Has mild nervous mood and affect. Behavior is normal.     Assessment & Plan:

## 2016-08-08 NOTE — Assessment & Plan Note (Signed)

## 2016-08-08 NOTE — Assessment & Plan Note (Signed)
stable overall by history and exam, recent data reviewed with pt, and pt to continue medical treatment as before,  to f/u any worsening symptoms or concerns Lab Results  Component Value Date   TSH 0.66 07/03/2015

## 2016-08-08 NOTE — Assessment & Plan Note (Signed)
Stable, for med refills

## 2016-08-08 NOTE — Assessment & Plan Note (Signed)
stable overall by history and exam, recent data reviewed with pt, and pt to continue medical treatment as before,  to f/u any worsening symptoms or concerns Lab Results  Component Value Date   CHOL 214 (H) 07/03/2015   HDL 53.00 07/03/2015   LDLDIRECT 133.0 07/03/2015   TRIG 280.0 (H) 07/03/2015   CHOLHDL 4 07/03/2015   consider statin, for lower chol diet, goal ldl < 100

## 2016-12-03 ENCOUNTER — Other Ambulatory Visit: Payer: Self-pay | Admitting: Internal Medicine

## 2017-02-18 ENCOUNTER — Ambulatory Visit: Payer: 59 | Admitting: Internal Medicine

## 2017-02-25 ENCOUNTER — Encounter: Payer: Self-pay | Admitting: Internal Medicine

## 2017-02-25 ENCOUNTER — Other Ambulatory Visit (INDEPENDENT_AMBULATORY_CARE_PROVIDER_SITE_OTHER): Payer: Managed Care, Other (non HMO)

## 2017-02-25 ENCOUNTER — Ambulatory Visit (INDEPENDENT_AMBULATORY_CARE_PROVIDER_SITE_OTHER): Payer: Managed Care, Other (non HMO) | Admitting: Internal Medicine

## 2017-02-25 VITALS — BP 108/78 | HR 65 | Ht 69.0 in | Wt 182.0 lb

## 2017-02-25 DIAGNOSIS — G4700 Insomnia, unspecified: Secondary | ICD-10-CM | POA: Diagnosis not present

## 2017-02-25 DIAGNOSIS — F419 Anxiety disorder, unspecified: Secondary | ICD-10-CM

## 2017-02-25 DIAGNOSIS — Z114 Encounter for screening for human immunodeficiency virus [HIV]: Secondary | ICD-10-CM | POA: Diagnosis not present

## 2017-02-25 DIAGNOSIS — Z0000 Encounter for general adult medical examination without abnormal findings: Secondary | ICD-10-CM

## 2017-02-25 LAB — URINALYSIS, ROUTINE W REFLEX MICROSCOPIC
Bilirubin Urine: NEGATIVE
HGB URINE DIPSTICK: NEGATIVE
Ketones, ur: NEGATIVE
Leukocytes, UA: NEGATIVE
NITRITE: NEGATIVE
SPECIFIC GRAVITY, URINE: 1.015 (ref 1.000–1.030)
Total Protein, Urine: NEGATIVE
URINE GLUCOSE: NEGATIVE
Urobilinogen, UA: 0.2 (ref 0.0–1.0)
pH: 7.5 (ref 5.0–8.0)

## 2017-02-25 LAB — BASIC METABOLIC PANEL
BUN: 14 mg/dL (ref 6–23)
CO2: 25 mEq/L (ref 19–32)
Calcium: 9.3 mg/dL (ref 8.4–10.5)
Chloride: 105 mEq/L (ref 96–112)
Creatinine, Ser: 0.79 mg/dL (ref 0.40–1.20)
GFR: 82.39 mL/min (ref >60.00)
Glucose, Bld: 96 mg/dL (ref 70–99)
POTASSIUM: 4.2 meq/L (ref 3.5–5.1)
Sodium: 136 mEq/L (ref 135–145)

## 2017-02-25 LAB — LIPID PANEL
CHOL/HDL RATIO: 4
Cholesterol: 243 mg/dL — ABNORMAL HIGH (ref 0–200)
HDL: 59.9 mg/dL (ref >39.00)
LDL Cholesterol: 159 mg/dL — ABNORMAL HIGH (ref 0–99)
NONHDL: 183.41
Triglycerides: 124 mg/dL (ref 0.0–149.0)
VLDL: 24.8 mg/dL (ref 0.0–40.0)

## 2017-02-25 LAB — CBC WITH DIFFERENTIAL/PLATELET
BASOS PCT: 1.3 % (ref 0.0–3.0)
Basophils Absolute: 0.1 10*3/uL (ref 0.0–0.1)
EOS ABS: 0.2 10*3/uL (ref 0.0–0.7)
EOS PCT: 3.3 % (ref 0.0–5.0)
HEMATOCRIT: 42.5 % (ref 36.0–46.0)
Hemoglobin: 14.3 g/dL (ref 12.0–15.0)
LYMPHS PCT: 24.2 % (ref 12.0–46.0)
Lymphs Abs: 1.4 10*3/uL (ref 0.7–4.0)
MCHC: 33.6 g/dL (ref 30.0–36.0)
MCV: 98.9 fl (ref 78.0–100.0)
MONO ABS: 0.5 10*3/uL (ref 0.1–1.0)
Monocytes Relative: 8.4 % (ref 3.0–12.0)
Neutro Abs: 3.6 10*3/uL (ref 1.4–7.7)
Neutrophils Relative %: 62.8 % (ref 43.0–77.0)
PLATELETS: 338 10*3/uL (ref 150.0–400.0)
RBC: 4.3 Mil/uL (ref 3.87–5.11)
RDW: 12.5 % (ref 11.5–15.5)
WBC: 5.8 10*3/uL (ref 4.0–10.5)

## 2017-02-25 LAB — HEPATIC FUNCTION PANEL
ALT: 18 U/L (ref 0–35)
AST: 15 U/L (ref 0–37)
Albumin: 4.1 g/dL (ref 3.5–5.2)
Alkaline Phosphatase: 59 U/L (ref 39–117)
BILIRUBIN DIRECT: 0.1 mg/dL (ref 0.0–0.3)
BILIRUBIN TOTAL: 0.6 mg/dL (ref 0.2–1.2)
Total Protein: 6.9 g/dL (ref 6.0–8.3)

## 2017-02-25 LAB — TSH: TSH: 0.15 u[IU]/mL — ABNORMAL LOW (ref 0.35–4.50)

## 2017-02-25 MED ORDER — ESCITALOPRAM OXALATE 10 MG PO TABS: 10.0000 mg | ORAL_TABLET | Freq: Every day | ORAL | 3 refills | Status: DC

## 2017-02-25 MED ORDER — ZALEPLON 5 MG PO CAPS: 5.0000 mg | ORAL_CAPSULE | Freq: Every evening | ORAL | 1 refills | Status: DC | PRN

## 2017-02-25 MED ORDER — ALPRAZOLAM 0.5 MG PO TABS: ORAL_TABLET | ORAL | 1 refills | Status: DC

## 2017-02-25 NOTE — Progress Notes (Signed)
Pre visit review using our clinic review tool, if applicable. No additional management support is needed unless otherwise documented below in the visit note. 

## 2017-02-25 NOTE — Patient Instructions (Addendum)

## 2017-02-25 NOTE — Progress Notes (Signed)
Subjective:    Patient ID: Stephanie Schwartz, female    DOB: 06-09-1966, 51 y.o.   MRN: [id number]  HPI  Here for wellness and f/u;  Overall doing ok;  Pt denies Chest pain, worsening SOB, DOE, wheezing, orthopnea, PND, worsening LE edema, palpitations, dizziness or syncope.  Pt denies neurological change such as new headache, facial or extremity weakness.  Pt denies polydipsia, polyuria, or low sugar symptoms. Pt states overall good compliance with treatment and medications, good tolerability, and has been trying to follow appropriate diet.  Pt denies worsening depressive symptoms, suicidal ideation or panic. No fever, night sweats, wt loss, loss of appetite, or other constitutional symptoms.  Pt states good ability with ADL's, has low fall risk, home safety reviewed and adequate, no other significant changes in hearing or vision, and  very active with gym several times per wk.  Still travels frequently for work which disrupts her scheduled workouts.  Hard to lose wt.  Sees endo for thyroid.  Believes she could follow a lower chol diet, and declines statin for now no other new hx BP Readings from Last 3 Encounters:  02/25/17 108/78  08/03/16 122/76  03/15/16 137/78   Wt Readings from Last 3 Encounters:  02/25/17 182 lb (82.6 kg)  08/03/16 181 lb 6 oz (82.3 kg)  01/09/16 180 lb (81.6 kg)   Past Medical History:  Diagnosis Date  . ALLERGIC RHINITIS 03/01/2008  . Anxiety 02/25/2012  . ASTHMA    "allergy induced"  . ASTHMATIC BRONCHITIS, ACUTE 10/14/2008  . Heart murmur    "maybe" (12/02/2014)  . HYPERCHOLESTEROLEMIA 08/21/2007  . HYPOTHYROIDISM   . OTITIS MEDIA, SEROUS, ACUTE, LEFT 03/15/2008  . SHINGLES 06/07/2008  . Wheezing 10/14/2009   Past Surgical History:  Procedure Laterality Date  . APPENDECTOMY  12/02/2014   lap  . LAPAROSCOPIC APPENDECTOMY N/A 12/02/2014   Procedure: APPENDECTOMY LAPAROSCOPIC;  Surgeon: Ralene Ok, MD;  Location: [place];  Service: General;  Laterality: N/A;  .  REDUCTION MAMMAPLASTY Bilateral ~ 1995  . SHOULDER ARTHROSCOPY Left ~ 2009    reports that she has never smoked. She has never used smokeless tobacco. She reports that she drinks about 8.4 oz of alcohol per week . She reports that she does not use drugs. family history includes Alcohol abuse in her other; Cancer in her other; Diabetes in her other; Hyperlipidemia in her other; Hypertension in her other. No Known Allergies Current Outpatient Prescriptions on File Prior to Visit  Medication Sig Dispense Refill  . ibuprofen (ADVIL,MOTRIN) 200 MG tablet Take 200 mg by mouth every 6 (six) hours as needed for mild pain or moderate pain.    Marland Kitchen loratadine-pseudoephedrine (CLARITIN-D 24-HOUR) 10-240 MG per 24 hr tablet Take 1 tablet by mouth daily.      . SYMBICORT 160-4.5 MCG/ACT inhaler INHALE 2 PUFFS INTO THE LUNGS 2 (TWO) TIMES DAILY. 6 g 8  . thyroid (ARMOUR) 180 MG tablet Take 180 mg by mouth daily.     No current facility-administered medications on file prior to visit.    Review of Systems Constitutional: Negative for other unusual diaphoresis, sweats, appetite or weight changes HENT: Negative for other worsening hearing loss, ear pain, facial swelling, mouth sores or neck stiffness.   Eyes: Negative for other worsening pain, redness or other visual disturbance.  Respiratory: Negative for other stridor or swelling Cardiovascular: Negative for other palpitations or other chest pain  Gastrointestinal: Negative for worsening diarrhea or loose stools, blood in stool, distention or other  pain Genitourinary: Negative for hematuria, flank pain or other change in urine volume.  Musculoskeletal: Negative for myalgias or other joint swelling.  Skin: Negative for other color change, or other wound or worsening drainage.  Neurological: Negative for other syncope or numbness. Hematological: Negative for other adenopathy or swelling Psychiatric/Behavioral: Negative for hallucinations, other worsening  agitation, SI, self-injury, or new decreased concentration All other system neg per pt    Objective:   Physical Exam BP 108/78   Pulse 65   Ht 5\' 9"  (1.753 m)   Wt 182 lb (82.6 kg)   LMP 01/31/2017   SpO2 100%   BMI 26.88 kg/m  VS noted,  Constitutional: Pt is oriented to person, place, and time. Appears well-developed and well-nourished, in no significant distress and comfortable Head: Normocephalic and atraumatic  Eyes: Conjunctivae and EOM are normal. Pupils are equal, round, and reactive to light Right Ear: External ear normal without discharge Left Ear: External ear normal without discharge Nose: Nose without discharge or deformity Mouth/Throat: Oropharynx is without other ulcerations and moist  Neck: Normal range of motion. Neck supple. No JVD present. No tracheal deviation present or significant neck LA or mass Cardiovascular: Normal rate, regular rhythm, normal heart sounds and intact distal pulses.   Pulmonary/Chest: WOB normal and breath sounds without rales or wheezing  Abdominal: Soft. Bowel sounds are normal. NT. No HSM  Musculoskeletal: Normal range of motion. Exhibits no edema Lymphadenopathy: Has no other cervical adenopathy.  Neurological: Pt is alert and oriented to person, place, and time. Pt has normal reflexes. No cranial nerve deficit. Motor grossly intact, Gait intact Skin: Skin is warm and dry. No rash noted or new ulcerations Psychiatric:  Has normal mood and affect. Behavior is normal without agitation No other new exam findings    Assessment & Plan:

## 2017-02-26 LAB — HIV ANTIBODY (ROUTINE TESTING W REFLEX): HIV 1&2 Ab, 4th Generation: NONREACTIVE

## 2017-02-26 NOTE — Assessment & Plan Note (Signed)

## 2017-02-26 NOTE — Assessment & Plan Note (Signed)
Chronic persitent intermittent, for ambien refill

## 2017-02-26 NOTE — Assessment & Plan Note (Signed)
Chronic persistent intermittent, for med refill

## 2017-04-07 ENCOUNTER — Telehealth: Payer: Self-pay | Admitting: Internal Medicine

## 2017-04-07 NOTE — Telephone Encounter (Signed)
Patient states endocrinologist would like her TSH results on last labs.  Patients endocrinologist is  DR. Balen with [facility].  Fx:  [phone].

## 2017-04-07 NOTE — Telephone Encounter (Signed)
faxed

## 2017-09-02 ENCOUNTER — Other Ambulatory Visit: Payer: Self-pay | Admitting: Internal Medicine

## 2017-09-02 NOTE — Telephone Encounter (Signed)
Faxed

## 2017-09-02 NOTE — Telephone Encounter (Signed)
Done hardcopy to Shirron  

## 2017-09-29 ENCOUNTER — Other Ambulatory Visit: Payer: Self-pay | Admitting: Internal Medicine

## 2017-09-29 NOTE — Telephone Encounter (Signed)
Done erx 

## 2018-01-17 ENCOUNTER — Encounter: Payer: Self-pay | Admitting: Family Medicine

## 2018-01-17 ENCOUNTER — Ambulatory Visit: Payer: Managed Care, Other (non HMO) | Admitting: Family Medicine

## 2018-01-17 VITALS — BP 122/64 | HR 68 | Temp 98.0°F | Ht 69.0 in | Wt 188.0 lb

## 2018-01-17 DIAGNOSIS — R05 Cough: Secondary | ICD-10-CM | POA: Diagnosis not present

## 2018-01-17 DIAGNOSIS — R059 Cough, unspecified: Secondary | ICD-10-CM

## 2018-01-17 MED ORDER — BUDESONIDE-FORMOTEROL FUMARATE 160-4.5 MCG/ACT IN AERO: INHALATION_SPRAY | RESPIRATORY_TRACT | 8 refills | Status: DC

## 2018-01-17 NOTE — Patient Instructions (Signed)
Please try the albuterol before going to bed and the symbicort.  Call me back if this doesn't improve and we can try other things    Honey can help with a cough.  Vick's and Delsym can help with a cough.

## 2018-01-17 NOTE — Progress Notes (Signed)
Stephanie Schwartz - 52 y.o. female MRN [id number]  Date of birth: Jul 16, 1966  SUBJECTIVE:  Including CC & ROS.  Chief Complaint  Patient presents with  . Cough    Stephanie Schwartz is a 52 y.o. female that is presenting with a cough. Ongoing for two weeks. Denies fevers or chills. Denies shortness of breath. Admits to productive cough with mucous. Cough worsens at night. Feels likes she is wheezing at night. She has been taking claritn. She has not been around anyone with similar symptoms. She has a history of asthma like symptoms. Has been out of her Symbicort.    Review of Systems  Constitutional: Negative for fever.  HENT: Negative for congestion.   Respiratory: Positive for cough.   Cardiovascular: Negative for chest pain.    HISTORY: Past Medical, Surgical, Social, and Family History Reviewed & Updated per EMR.   Pertinent Historical Findings include:  Past Medical History:  Diagnosis Date  . ALLERGIC RHINITIS 03/01/2008  . Anxiety 02/25/2012  . ASTHMA    "allergy induced"  . ASTHMATIC BRONCHITIS, ACUTE 10/14/2008  . Heart murmur    "maybe" (12/02/2014)  . HYPERCHOLESTEROLEMIA 08/21/2007  . HYPOTHYROIDISM   . OTITIS MEDIA, SEROUS, ACUTE, LEFT 03/15/2008  . SHINGLES 06/07/2008  . Wheezing 10/14/2009    Past Surgical History:  Procedure Laterality Date  . APPENDECTOMY  12/02/2014   lap  . LAPAROSCOPIC APPENDECTOMY N/A 12/02/2014   Procedure: APPENDECTOMY LAPAROSCOPIC;  Surgeon: Ralene Ok, MD;  Location: [place];  Service: General;  Laterality: N/A;  . REDUCTION MAMMAPLASTY Bilateral ~ 1995  . SHOULDER ARTHROSCOPY Left ~ 2009    No Known Allergies  Family History  Problem Relation Age of Onset  . Alcohol abuse Other        ETOH  . Cancer Other        Breast Cancer  . Hypertension Other   . Hyperlipidemia Other   . Diabetes Other      Social History   Socioeconomic History  . Marital status: Divorced    Spouse name: Not on file  . Number of children: 1  .  Years of education: Not on file  . Highest education level: Not on file  Social Needs  . Financial resource strain: Not on file  . Food insecurity - worry: Not on file  . Food insecurity - inability: Not on file  . Transportation needs - medical: Not on file  . Transportation needs - non-medical: Not on file  Occupational History  . Not on file  Tobacco Use  . Smoking status: Never Smoker  . Smokeless tobacco: Never Used  Substance and Sexual Activity  . Alcohol use: Yes    Alcohol/week: 8.4 oz    Types: 14 Glasses of wine per week  . Drug use: No  . Sexual activity: Yes  Other Topics Concern  . Not on file  Social History Narrative  . Not on file     PHYSICAL EXAM:  VS: BP 122/64 (BP Location: Left Arm, Patient Position: Sitting, Cuff Size: Normal)   Pulse 68   Temp 98 F (36.7 C) (Oral)   Ht 5\' 9"  (1.753 m)   Wt 188 lb (85.3 kg)   SpO2 100%   BMI 27.76 kg/m  Physical Exam Gen: NAD, alert, cooperative with exam, well-appearing ENT: normal lips, normal nasal mucosa, tympanic membranes clear and intact bilaterally, normal oropharynx, no cervical lymphadenopathy Eye: normal EOM, normal conjunctiva and lids CV:  no edema, +2 pedal pulses, regular  rate and rhythm, S1-S2   Resp: no accessory muscle use, non-labored, clear to auscultation bilaterally, no crackles or wheezes  Skin: no rashes, no areas of induration  Neuro: normal tone, normal sensation to touch Psych:  normal insight, alert and oriented MSK: Normal gait, normal strength       ASSESSMENT & PLAN:   Cough Possible to be related to underlying asthma. Less likely for reflux. Hasn't been sick recently  - refilled symbicort and advised on albuterol use  - if no improvement could consider CXR, azithro, or PPI

## 2018-01-18 NOTE — Assessment & Plan Note (Signed)
Possible to be related to underlying asthma. Less likely for reflux. Hasn't been sick recently  - refilled symbicort and advised on albuterol use  - if no improvement could consider CXR, azithro, or PPI

## 2018-01-31 ENCOUNTER — Other Ambulatory Visit: Payer: Self-pay | Admitting: Internal Medicine

## 2018-01-31 MED ORDER — ALPRAZOLAM 0.5 MG PO TABS: ORAL_TABLET | ORAL | 1 refills | Status: DC

## 2018-01-31 NOTE — Telephone Encounter (Signed)
Requesting refill of Xanax  LOV 01/17/18 with Dr. Raeford Razor  LOV anxiety addressed 02/25/18, Dr Jenny Reichmann  Also requesting RX of Singulair which she states was discussed with Dr. Raeford Razor 3/19. Did not see reference to this.   [id number] - [address] [place]

## 2018-01-31 NOTE — Telephone Encounter (Signed)
Copied from [place] [id number]. Topic: Quick Communication - Rx Refill/Question >> Jan 31, 2018  9:49 AM Arletha Grippe wrote: Medication: alprazolam - and singulair  Has the patient contacted their pharmacy? Yes.   (Agent: If no, request that the patient contact the pharmacy for the refill.) Preferred Pharmacy (with phone number or street name): [facility] [address]  Agent: Please be advised that RX refills may take up to 3 business days. We ask that you follow-up with your pharmacy.  The singulair is a new rx - was recommended by a dr in the practice a few days go at appt.

## 2018-01-31 NOTE — Telephone Encounter (Signed)
Done erx 

## 2018-03-01 ENCOUNTER — Other Ambulatory Visit: Payer: Self-pay | Admitting: Internal Medicine

## 2018-03-01 ENCOUNTER — Other Ambulatory Visit (INDEPENDENT_AMBULATORY_CARE_PROVIDER_SITE_OTHER): Payer: Managed Care, Other (non HMO)

## 2018-03-01 ENCOUNTER — Telehealth: Payer: Self-pay

## 2018-03-01 ENCOUNTER — Ambulatory Visit (INDEPENDENT_AMBULATORY_CARE_PROVIDER_SITE_OTHER): Payer: Managed Care, Other (non HMO) | Admitting: Internal Medicine

## 2018-03-01 ENCOUNTER — Encounter: Payer: Self-pay | Admitting: Internal Medicine

## 2018-03-01 VITALS — BP 126/82 | HR 77 | Temp 98.3°F | Ht 69.0 in | Wt 184.0 lb

## 2018-03-01 DIAGNOSIS — R739 Hyperglycemia, unspecified: Secondary | ICD-10-CM | POA: Diagnosis not present

## 2018-03-01 DIAGNOSIS — E559 Vitamin D deficiency, unspecified: Secondary | ICD-10-CM | POA: Diagnosis not present

## 2018-03-01 DIAGNOSIS — Z0001 Encounter for general adult medical examination with abnormal findings: Secondary | ICD-10-CM | POA: Diagnosis not present

## 2018-03-01 DIAGNOSIS — E039 Hypothyroidism, unspecified: Secondary | ICD-10-CM | POA: Diagnosis not present

## 2018-03-01 DIAGNOSIS — J309 Allergic rhinitis, unspecified: Secondary | ICD-10-CM | POA: Diagnosis not present

## 2018-03-01 DIAGNOSIS — E7800 Pure hypercholesterolemia, unspecified: Secondary | ICD-10-CM

## 2018-03-01 DIAGNOSIS — F419 Anxiety disorder, unspecified: Secondary | ICD-10-CM | POA: Diagnosis not present

## 2018-03-01 DIAGNOSIS — J45909 Unspecified asthma, uncomplicated: Secondary | ICD-10-CM

## 2018-03-01 LAB — URINALYSIS, ROUTINE W REFLEX MICROSCOPIC
Bilirubin Urine: NEGATIVE
Hgb urine dipstick: NEGATIVE
Ketones, ur: NEGATIVE
Nitrite: NEGATIVE
Total Protein, Urine: NEGATIVE
URINE GLUCOSE: NEGATIVE
UROBILINOGEN UA: 0.2 (ref 0.0–1.0)
pH: 6 (ref 5.0–8.0)

## 2018-03-01 LAB — BASIC METABOLIC PANEL
BUN: 19 mg/dL (ref 6–23)
CALCIUM: 9.1 mg/dL (ref 8.4–10.5)
CO2: 23 mEq/L (ref 19–32)
CREATININE: 0.82 mg/dL (ref 0.40–1.20)
Chloride: 106 mEq/L (ref 96–112)
GFR: 78.59 mL/min (ref >60.00)
Glucose, Bld: 91 mg/dL (ref 70–99)
Potassium: 4 mEq/L (ref 3.5–5.1)
Sodium: 137 mEq/L (ref 135–145)

## 2018-03-01 LAB — HEPATIC FUNCTION PANEL
ALK PHOS: 50 U/L (ref 39–117)
ALT: 19 U/L (ref 0–35)
AST: 14 U/L (ref 0–37)
Albumin: 4 g/dL (ref 3.5–5.2)
Bilirubin, Direct: 0.1 mg/dL (ref 0.0–0.3)
TOTAL PROTEIN: 6.8 g/dL (ref 6.0–8.3)
Total Bilirubin: 0.5 mg/dL (ref 0.2–1.2)

## 2018-03-01 LAB — CBC WITH DIFFERENTIAL/PLATELET
BASOS ABS: 0 10*3/uL (ref 0.0–0.1)
Basophils Relative: 0.7 % (ref 0.0–3.0)
EOS PCT: 4 % (ref 0.0–5.0)
Eosinophils Absolute: 0.2 10*3/uL (ref 0.0–0.7)
HEMATOCRIT: 39.6 % (ref 36.0–46.0)
HEMOGLOBIN: 13.5 g/dL (ref 12.0–15.0)
LYMPHS ABS: 1.3 10*3/uL (ref 0.7–4.0)
LYMPHS PCT: 20.5 % (ref 12.0–46.0)
MCHC: 34 g/dL (ref 30.0–36.0)
MCV: 97.2 fl (ref 78.0–100.0)
MONOS PCT: 9 % (ref 3.0–12.0)
Monocytes Absolute: 0.6 10*3/uL (ref 0.1–1.0)
Neutro Abs: 4 10*3/uL (ref 1.4–7.7)
Neutrophils Relative %: 65.8 % (ref 43.0–77.0)
Platelets: 286 10*3/uL (ref 150.0–400.0)
RBC: 4.08 Mil/uL (ref 3.87–5.11)
RDW: 12.5 % (ref 11.5–15.5)
WBC: 6.1 10*3/uL (ref 4.0–10.5)

## 2018-03-01 LAB — LIPID PANEL
CHOL/HDL RATIO: 4
Cholesterol: 213 mg/dL — ABNORMAL HIGH (ref 0–200)
HDL: 54 mg/dL (ref >39.00)
LDL Cholesterol: 123 mg/dL — ABNORMAL HIGH (ref 0–99)
NonHDL: 159.38
Triglycerides: 184 mg/dL — ABNORMAL HIGH (ref 0.0–149.0)
VLDL: 36.8 mg/dL (ref 0.0–40.0)

## 2018-03-01 LAB — T4, FREE: FREE T4: 1.25 ng/dL (ref 0.60–1.60)

## 2018-03-01 LAB — VITAMIN D 25 HYDROXY (VIT D DEFICIENCY, FRACTURES): VITD: 19.44 ng/mL — ABNORMAL LOW (ref 30.00–100.00)

## 2018-03-01 LAB — TSH: TSH: 0.06 u[IU]/mL — AB (ref 0.35–4.50)

## 2018-03-01 LAB — HEMOGLOBIN A1C: Hgb A1c MFr Bld: 5.4 % (ref 4.6–6.5)

## 2018-03-01 MED ORDER — VITAMIN D (ERGOCALCIFEROL) 1.25 MG (50000 UNIT) PO CAPS: 50000.0000 [IU] | ORAL_CAPSULE | ORAL | 0 refills | Status: DC

## 2018-03-01 MED ORDER — MONTELUKAST SODIUM 10 MG PO TABS: 10.0000 mg | ORAL_TABLET | Freq: Every day | ORAL | 3 refills | Status: DC

## 2018-03-01 MED ORDER — ALPRAZOLAM 0.5 MG PO TABS: ORAL_TABLET | ORAL | 1 refills | Status: DC

## 2018-03-01 NOTE — Patient Instructions (Addendum)
Please remember to make your yearly GYN appt, and Dr Kipp Brood as you do (we will try to fax labs to Dr Chalmers Cater to see as well)  OK to take half Lexapro for 3 wks and then stop  Please take all new medication as prescribed - the singulair  Please continue all other medications as before, and refills have been done if requested  - the xanax   Please have the pharmacy call with any other refills you may need.  Please continue your efforts at being more active, low cholesterol diet, and weight control.  You are otherwise up to date with prevention measures today.  Please keep your appointments with your specialists as you may have planned  Please go to the LAB in the Basement (turn left off the elevator) for the tests to be done today  You will be contacted by phone if any changes need to be made immediately.  Otherwise, you will receive a letter about your results with an explanation, but please check with MyChart first.  Please remember to sign up for MyChart if you have not done so, as this will be important to you in the future with finding out test results, communicating by private email, and scheduling acute appointments online when needed.  Please return in 1 year for your yearly visit, or sooner if needed, with Lab testing done 3-5 days before

## 2018-03-01 NOTE — Assessment & Plan Note (Signed)
Ok to cont benzo prn,  to f/u any worsening symptoms or concerns

## 2018-03-01 NOTE — Telephone Encounter (Signed)
Pt has viewed results via MyChart  

## 2018-03-01 NOTE — Assessment & Plan Note (Signed)

## 2018-03-01 NOTE — Assessment & Plan Note (Addendum)
Mild to mod, for singulair 10 qd,  to f/u any worsening symptoms or concerns  In addition to the time spent performing CPE, I spent an additional 25 minutes face to face,in which greater than 50% of this time was spent in counseling and coordination of care for patient's acute illness as documented, including the differential dx, treatment, further evaluation and other management of allergic rhinitis, anxiety, asthma, HLD, hyperglycemia and hypothyroidism

## 2018-03-01 NOTE — Telephone Encounter (Signed)
-----   Message from Biagio Borg, MD sent at 03/01/2018  2:32 PM EDT ----- Left message on MyChart, pt to cont same tx except  The test results show that your current treatment is OK, except the Vitamin D level is moderately low.  This is important for overall bone health.  Please take Vit D at 50,000 units weekly for 12 wks, then change to the OTC Vit D3 2000 units per day (indefintely).   Shirron to please inform pt, I will do rx

## 2018-03-01 NOTE — Assessment & Plan Note (Signed)
Also for free t4, f/u endo as planned

## 2018-03-01 NOTE — Assessment & Plan Note (Signed)
No results found for: HGBA1C stable overall by history and exam, recent data reviewed with pt, and pt to continue medical treatment as before,  to f/u any worsening symptoms or concerns, for f/u a1c with labs

## 2018-03-01 NOTE — Progress Notes (Signed)
Subjective:    Patient ID: Stephanie Schwartz, female    DOB: 1966/01/20, 52 y.o.   MRN: [id number]  HPI   Here for wellness and f/u;  Overall doing ok;  Pt denies Chest pain, worsening SOB, DOE, wheezing, orthopnea, PND, worsening LE edema, palpitations, dizziness or syncope.  Pt denies neurological change such as new headache, facial or extremity weakness.  Pt denies polydipsia, polyuria, or low sugar symptoms. Pt states overall good compliance with treatment and medications, good tolerability, and has been trying to follow appropriate diet.  Pt denies worsening depressive symptoms, suicidal ideation or panic. No fever, night sweats, wt loss, loss of appetite, or other constitutional symptoms.  Pt states good ability with ADL's, has low fall risk, home safety reviewed and adequate, no other significant changes in hearing or vision, and only occasionally active with exercise.  Depression situational improved, and wants to lexapro.  Follows with Dr Kipp Brood for thyroid.  No new complaints or interval change except Does have several wks ongoing nasal allergy symptoms with clearish congestion, itch and sneezing, without fever, pain, ST, cough, swelling or wheezing.  Asks for singulair as she wants to avoid decongestant such as claritin D due to risk of higher BP.  Denies hyper or hypo thyroid symptoms such as voice, skin or hair change. Past Medical History:  Diagnosis Date  . ALLERGIC RHINITIS 03/01/2008  . Anxiety 02/25/2012  . ASTHMA    "allergy induced"  . ASTHMATIC BRONCHITIS, ACUTE 10/14/2008  . Heart murmur    "maybe" (12/02/2014)  . HYPERCHOLESTEROLEMIA 08/21/2007  . HYPOTHYROIDISM   . OTITIS MEDIA, SEROUS, ACUTE, LEFT 03/15/2008  . SHINGLES 06/07/2008  . Wheezing 10/14/2009   Past Surgical History:  Procedure Laterality Date  . APPENDECTOMY  12/02/2014   lap  . LAPAROSCOPIC APPENDECTOMY N/A 12/02/2014   Procedure: APPENDECTOMY LAPAROSCOPIC;  Surgeon: Ralene Ok, MD;  Location: [facility];   Service: General;  Laterality: N/A;  . REDUCTION MAMMAPLASTY Bilateral ~ 1995  . SHOULDER ARTHROSCOPY Left ~ 2009    reports that she has never smoked. She has never used smokeless tobacco. She reports that she drinks about 8.4 oz of alcohol per week. She reports that she does not use drugs. family history includes Alcohol abuse in her other; Cancer in her other; Diabetes in her other; Hyperlipidemia in her other; Hypertension in her other. No Known Allergies Current Outpatient Medications on File Prior to Visit  Medication Sig Dispense Refill  . budesonide-formoterol (SYMBICORT) 160-4.5 MCG/ACT inhaler INHALE 2 PUFFS INTO THE LUNGS 2 (TWO) TIMES DAILY. 6 g 8  . ibuprofen (ADVIL,MOTRIN) 200 MG tablet Take 200 mg by mouth every 6 (six) hours as needed for mild pain or moderate pain.    Marland Kitchen levothyroxine (SYNTHROID, LEVOTHROID) 200 MCG tablet     . zaleplon (SONATA) 5 MG capsule TAKE ONE CAPSULE BY MOUTH EVERY NIGHT AT BEDTIME AS NEEDED FOR SLEEP 30 capsule 5   No current facility-administered medications on file prior to visit.    Review of Systems Constitutional: Negative for other unusual diaphoresis, sweats, appetite or weight changes HENT: Negative for other worsening hearing loss, ear pain, facial swelling, mouth sores or neck stiffness.   Eyes: Negative for other worsening pain, redness or other visual disturbance.  Respiratory: Negative for other stridor or swelling Cardiovascular: Negative for other palpitations or other chest pain  Gastrointestinal: Negative for worsening diarrhea or loose stools, blood in stool, distention or other pain Genitourinary: Negative for hematuria, flank pain or other change  in urine volume.  Musculoskeletal: Negative for myalgias or other joint swelling.  Skin: Negative for other color change, or other wound or worsening drainage.  Neurological: Negative for other syncope or numbness. Hematological: Negative for other adenopathy or  swelling Psychiatric/Behavioral: Negative for hallucinations, other worsening agitation, SI, self-injury, or new decreased concentration All other system neg per pt    Objective:   Physical Exam BP 126/82   Pulse 77   Temp 98.3 F (36.8 C) (Oral)   Ht 5\' 9"  (1.753 m)   Wt 184 lb (83.5 kg)   SpO2 96%   BMI 27.17 kg/m  VS noted,  Constitutional: Pt is oriented to person, place, and time. Appears well-developed and well-nourished, in no significant distress and comfortable Head: Normocephalic and atraumatic  Eyes: Conjunctivae and EOM are normal. Pupils are equal, round, and reactive to light Right Ear: External ear normal without discharge Left Ear: External ear normal without discharge Nose: Nose without discharge or deformity Bilat tm's with mild erythema.  Max sinus areas non tender.  Pharynx with mild erythema, no exudate Mouth/Throat: Oropharynx is without other ulcerations and moist  Neck: Normal range of motion. Neck supple. No JVD present. No tracheal deviation present or significant neck LA or mass Cardiovascular: Normal rate, regular rhythm, normal heart sounds and intact distal pulses.   Pulmonary/Chest: WOB normal and breath sounds without rales or wheezing  Abdominal: Soft. Bowel sounds are normal. NT. No HSM  Musculoskeletal: Normal range of motion. Exhibits no edema Lymphadenopathy: Has no other cervical adenopathy.  Neurological: Pt is alert and oriented to person, place, and time. Pt has normal reflexes. No cranial nerve deficit. Motor grossly intact, Gait intact Skin: Skin is warm and dry. No rash noted or new ulcerations Psychiatric:  Has normal mood and affect. Behavior is normal without agitation No other exam findings    Assessment & Plan:

## 2018-03-01 NOTE — Assessment & Plan Note (Signed)
For f/u lab, goal <100, declines statin for now

## 2018-03-01 NOTE — Assessment & Plan Note (Signed)
stable overall by history and exam, and pt to continue medical treatment as before,  to f/u any worsening symptoms or concerns 

## 2018-03-07 ENCOUNTER — Telehealth: Payer: Self-pay | Admitting: Internal Medicine

## 2018-03-07 MED ORDER — ESCITALOPRAM OXALATE 10 MG PO TABS: 5.0000 mg | ORAL_TABLET | Freq: Every day | ORAL | 0 refills | Status: DC

## 2018-03-07 NOTE — Telephone Encounter (Signed)
LVM with details below 

## 2018-03-07 NOTE — Telephone Encounter (Signed)
Ok for half tab for 20 days, then stop - done erx

## 2018-03-07 NOTE — Telephone Encounter (Signed)
Pt would like to know if she could get one more refill of Lexapro to taper off since that was discussed during her OV last week about stopping. She didn't know if going cold [place] would be healthy and she has already gone 4 days without it due to her pharmacy denying her refill. Please advise.

## 2018-03-07 NOTE — Telephone Encounter (Signed)
Copied from [place] [id number]. Topic: Quick Communication - See Telephone Encounter >> Mar 07, 2018  2:42 PM Antonieta Iba C wrote: CRM for notification. See Telephone encounter for: 03/07/18.  Pt called in to speak with office about a her lexapro Rx, pt wouldn't go into detail but is requesting a call back  CB: [phone]

## 2018-03-07 NOTE — Telephone Encounter (Signed)
Called pt, LVM to call back to discuss her medication.

## 2018-03-07 NOTE — Addendum Note (Signed)
Addended by: Biagio Borg on: 03/07/2018 04:04 PM   Modules accepted: Orders

## 2018-03-16 ENCOUNTER — Ambulatory Visit: Payer: Self-pay

## 2018-03-16 ENCOUNTER — Ambulatory Visit: Payer: Managed Care, Other (non HMO) | Admitting: Family Medicine

## 2018-03-16 ENCOUNTER — Encounter: Payer: Self-pay | Admitting: Family Medicine

## 2018-03-16 VITALS — BP 110/84 | HR 68 | Ht 69.0 in | Wt 188.0 lb

## 2018-03-16 DIAGNOSIS — M25561 Pain in right knee: Secondary | ICD-10-CM

## 2018-03-16 DIAGNOSIS — M1812 Unilateral primary osteoarthritis of first carpometacarpal joint, left hand: Secondary | ICD-10-CM

## 2018-03-16 MED ORDER — DICLOFENAC SODIUM 2 % TD SOLN
2.0000 g | Freq: Two times a day (BID) | TRANSDERMAL | 3 refills | Status: DC
Start: 2018-03-16 — End: 2019-06-13

## 2018-03-16 NOTE — Patient Instructions (Signed)
Good to meet you  Knee compression when working out and your move.  Ice 20 minutes 2 times daily. Usually after activity and before bed. Exercises 3 times a week.   pennsaid pinkie amount topically 2 times daily as needed.   Continue the once weekly vitamin D for 12 weeks.  The thumb is arthritis and be careful putting  A lot of pressure on the thumb Over the counter get  Turmeric 500mg  twice daily  Tart cherry extract any dose at night See me agai nin 3-4 weeks and if not a lot better we will try injection

## 2018-03-16 NOTE — Progress Notes (Signed)
[facility] [address], [place] [address] Phone: [phone] Subjective:    I'm seeing this patient by the request  of:  Biagio Borg, MD   CC: Knee and thumb pain  [id number]  Stephanie Schwartz is a 52 y.o. female coming in with complaint of thumb pain. She is having pain in the left thumb for one year. Pain is constant. Pain increases with adduction and grasping the bar at [employer].   Patient believes she hyperextended her knee when she was going down stairs and didn't realize that she had one more step. Patient notes a decrease in range of motion in flexion. Does not have a lot of pain. Patient does cross fit and has been able to perform those workouts. Injury occurred 6 weeks ago.       Past Medical History:  Diagnosis Date  . ALLERGIC RHINITIS 03/01/2008  . Anxiety 02/25/2012  . ASTHMA    "allergy induced"  . ASTHMATIC BRONCHITIS, ACUTE 10/14/2008  . Heart murmur    "maybe" (12/02/2014)  . HYPERCHOLESTEROLEMIA 08/21/2007  . HYPOTHYROIDISM   . OTITIS MEDIA, SEROUS, ACUTE, LEFT 03/15/2008  . SHINGLES 06/07/2008  . Wheezing 10/14/2009   Past Surgical History:  Procedure Laterality Date  . APPENDECTOMY  12/02/2014   lap  . LAPAROSCOPIC APPENDECTOMY N/A 12/02/2014   Procedure: APPENDECTOMY LAPAROSCOPIC;  Surgeon: Ralene Ok, MD;  Location: [facility];  Service: General;  Laterality: N/A;  . REDUCTION MAMMAPLASTY Bilateral ~ 1995  . SHOULDER ARTHROSCOPY Left ~ 2009   Social History   Socioeconomic History  . Marital status: Divorced    Spouse name: Not on file  . Number of children: 1  . Years of education: Not on file  . Highest education level: Not on file  Occupational History  . Not on file  Social Needs  . Financial resource strain: Not on file  . Food insecurity:    Worry: Not on file    Inability: Not on file  . Transportation needs:    Medical: Not on file    Non-medical: Not on file  Tobacco Use  . Smoking status:  Never Smoker  . Smokeless tobacco: Never Used  Substance and Sexual Activity  . Alcohol use: Yes    Alcohol/week: 8.4 oz    Types: 14 Glasses of wine per week  . Drug use: No  . Sexual activity: Yes  Lifestyle  . Physical activity:    Days per week: Not on file    Minutes per session: Not on file  . Stress: Not on file  Relationships  . Social connections:    Talks on phone: Not on file    Gets together: Not on file    Attends religious service: Not on file    Active member of club or organization: Not on file    Attends meetings of clubs or organizations: Not on file    Relationship status: Not on file  Other Topics Concern  . Not on file  Social History Narrative  . Not on file   No Known Allergies Family History  Problem Relation Age of Onset  . Alcohol abuse Other        ETOH  . Cancer Other        Breast Cancer  . Hypertension Other   . Hyperlipidemia Other   . Diabetes Other      Past medical history, social, surgical and family history all reviewed in electronic medical record.  No pertanent information unless stated regarding to the chief complaint.   Review of Systems:Review of systems updated and as accurate as of 03/17/18  No headache, visual changes, nausea, vomiting, diarrhea, constipation, dizziness, abdominal pain, skin rash, fevers, chills, night sweats, weight loss, swollen lymph nodes, body aches, joint swelling, muscle aches, chest pain, shortness of breath, mood changes.   Objective  Blood pressure 110/84, pulse 68, height 5\' 9"  (1.753 m), weight 188 lb (85.3 kg), SpO2 98 %. Systems examined below as of 03/17/18   General: No apparent distress alert and oriented x3 mood and affect normal, dressed appropriately.  HEENT: Pupils equal, extraocular movements intact  Respiratory: Patient's speak in full sentences and does not appear short of breath  Cardiovascular: No lower extremity edema, non tender, no erythema  Skin: Warm dry intact with no signs  of infection or rash on extremities or on axial skeleton.  Abdomen: Soft nontender  Neuro: Cranial nerves II through XII are intact, neurovascularly intact in all extremities with 2+ DTRs and 2+ pulses.  Lymph: No lymphadenopathy of posterior or anterior cervical chain or axillae bilaterally.  Gait normal with good balance and coordination.  MSK:  Non tender with full range of motion and good stability and symmetric strength and tone of shoulders, elbows,  hip, knee and ankles bilaterally.  Left wrist exam shows the patient has full range of motion.  Negative de Quervain's.  Negative Tinel's.  Good grip strength.  [place] joint does have a positive grind.  Laxity noted of the UCL on the left Contralateral hand unremarkable  Right knee exam shows some discomfort with full extension but does have full range of motion.  No significant abnormality on exam.  Some mild tenderness over the insertion of the hamstrings medially and laterally.  MSK US performed of: Right knee This study was ordered, performed, and interpreted by Charlann Boxer D.O.  Knee: All structures visualized. Anteromedial, anterolateral, posteromedial, and posterolateral menisci unremarkable without tearing, fraying, effusion, or displacement. Patellar Tendon unremarkable on long and transverse views without effusion. No abnormality of prepatellar bursa. LCL and MCL unremarkable on long and transverse views. No abnormality of origin of medial or lateral head of the gastrocnemius.  IMPRESSION:  NORMAL ULTRASONOGRAPHIC EXAMINATION OF THE KNEE.    Impression and Recommendations:     This case required medical decision making of moderate complexity.      Note: This dictation was prepared with Dragon dictation along with smaller phrase technology. Any transcriptional errors that result from this process are unintentional.

## 2018-03-17 DIAGNOSIS — M1812 Unilateral primary osteoarthritis of first carpometacarpal joint, left hand: Secondary | ICD-10-CM | POA: Insufficient documentation

## 2018-03-17 DIAGNOSIS — M25561 Pain in right knee: Secondary | ICD-10-CM | POA: Insufficient documentation

## 2018-03-17 NOTE — Assessment & Plan Note (Signed)
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  Discussed icing regimen and potentially consider injection.  Patient wants to avoid that at the moment.  Given topical anti-inflammatories.  Follow-up again in 4 weeks

## 2018-03-17 NOTE — Assessment & Plan Note (Signed)
Right knee pain.  Does have small effusion noted.  Believe it is more secondary to an hamstring issue.  Discussed icing regimen, compression, topical anti-inflammatories.  Follow-up again in 4 weeks

## 2018-03-24 ENCOUNTER — Other Ambulatory Visit: Payer: Self-pay | Admitting: Internal Medicine

## 2018-03-28 ENCOUNTER — Ambulatory Visit: Payer: Managed Care, Other (non HMO) | Admitting: Family Medicine

## 2018-04-12 NOTE — Progress Notes (Signed)
[facility] [address] [place], [place] [address] Phone: [phone] Subjective:     CC: Right knee pain follow-up  [id number]  Stephanie Schwartz is a 52 y.o. female coming in with complaint of right knee pain. She continues to have pain in the right knee. Pain in the IT band and posterior aspect of knee. Rowing for workouts instead of running.  Patient states that she has not made any significant improvement.  Patient was found mostly to have more distal hamstring tenderness.     Past Medical History:  Diagnosis Date  . ALLERGIC RHINITIS 03/01/2008  . Anxiety 02/25/2012  . ASTHMA    "allergy induced"  . ASTHMATIC BRONCHITIS, ACUTE 10/14/2008  . Heart murmur    "maybe" (12/02/2014)  . HYPERCHOLESTEROLEMIA 08/21/2007  . HYPOTHYROIDISM   . OTITIS MEDIA, SEROUS, ACUTE, LEFT 03/15/2008  . SHINGLES 06/07/2008  . Wheezing 10/14/2009   Past Surgical History:  Procedure Laterality Date  . APPENDECTOMY  12/02/2014   lap  . LAPAROSCOPIC APPENDECTOMY N/A 12/02/2014   Procedure: APPENDECTOMY LAPAROSCOPIC;  Surgeon: Ralene Ok, MD;  Location: [facility];  Service: General;  Laterality: N/A;  . REDUCTION MAMMAPLASTY Bilateral ~ 1995  . SHOULDER ARTHROSCOPY Left ~ 2009   Social History   Socioeconomic History  . Marital status: Divorced    Spouse name: Not on file  . Number of children: 1  . Years of education: Not on file  . Highest education level: Not on file  Occupational History  . Not on file  Social Needs  . Financial resource strain: Not on file  . Food insecurity:    Worry: Not on file    Inability: Not on file  . Transportation needs:    Medical: Not on file    Non-medical: Not on file  Tobacco Use  . Smoking status: Never Smoker  . Smokeless tobacco: Never Used  Substance and Sexual Activity  . Alcohol use: Yes    Alcohol/week: 8.4 oz    Types: 14 Glasses of wine per week  . Drug use: No  . Sexual activity: Yes  Lifestyle  .  Physical activity:    Days per week: Not on file    Minutes per session: Not on file  . Stress: Not on file  Relationships  . Social connections:    Talks on phone: Not on file    Gets together: Not on file    Attends religious service: Not on file    Active member of club or organization: Not on file    Attends meetings of clubs or organizations: Not on file    Relationship status: Not on file  Other Topics Concern  . Not on file  Social History Narrative  . Not on file   No Known Allergies Family History  Problem Relation Age of Onset  . Alcohol abuse Other        ETOH  . Cancer Other        Breast Cancer  . Hypertension Other   . Hyperlipidemia Other   . Diabetes Other      Past medical history, social, surgical and family history all reviewed in electronic medical record.  No pertanent information unless stated regarding to the chief complaint.   Review of Systems:Review of systems updated and as accurate as of 04/12/18  No headache, visual changes, nausea, vomiting, diarrhea, constipation, dizziness, abdominal pain, skin rash, fevers, chills, night sweats, weight loss, swollen lymph nodes, body aches, joint  swelling, muscle aches, chest pain, shortness of breath, mood changes.   Objective  There were no vitals taken for this visit. Systems examined below as of 04/12/18   General: No apparent distress alert and oriented x3 mood and affect normal, dressed appropriately.  HEENT: Pupils equal, extraocular movements intact  Respiratory: Patient's speak in full sentences and does not appear short of breath  Cardiovascular: No lower extremity edema, non tender, no erythema  Skin: Warm dry intact with no signs of infection or rash on extremities or on axial skeleton.  Abdomen: Soft nontender  Neuro: Cranial nerves II through XII are intact, neurovascularly intact in all extremities with 2+ DTRs and 2+ pulses.  Lymph: No lymphadenopathy of posterior or anterior cervical  chain or axillae bilaterally.  Gait normal with good balance and coordination.  MSK:  Non tender with full range of motion and good stability and symmetric strength and tone of shoulders, elbows, wrist, hip, and ankles bilaterally.  Right knee exam actually shows a little bit more of a patellofemoral syndrome.  Patient does have guarding of the patellofemoral joint.  Tender to palpation of the anterior lateral superior aspect of the knee.  Patient also minorly tender over the lateral aspect of the knee.  Near full range of motion of the knee.  Negative McMurray's.  After informed written and verbal consent, patient was seated on exam table. Right knee was prepped with alcohol swab and utilizing anterolateral approach, patient's right knee space was injected with 4:1  marcaine 0.5%: Kenalog 40mg /dL. Patient tolerated the procedure well without immediate complications.   Impression and Recommendations:     This case required medical decision making of moderate complexity.      Note: This dictation was prepared with Dragon dictation along with smaller phrase technology. Any transcriptional errors that result from this process are unintentional.

## 2018-04-13 ENCOUNTER — Encounter: Payer: Self-pay | Admitting: Family Medicine

## 2018-04-13 ENCOUNTER — Ambulatory Visit (INDEPENDENT_AMBULATORY_CARE_PROVIDER_SITE_OTHER)
Admission: RE | Admit: 2018-04-13 | Discharge: 2018-04-13 | Disposition: A | Discharge status: Home / Self Care | Payer: Managed Care, Other (non HMO) | Source: Ambulatory Visit | Attending: Family Medicine | Admitting: Family Medicine

## 2018-04-13 ENCOUNTER — Ambulatory Visit: Payer: Managed Care, Other (non HMO) | Admitting: Family Medicine

## 2018-04-13 ENCOUNTER — Other Ambulatory Visit: Payer: Self-pay

## 2018-04-13 VITALS — BP 110/78 | HR 87 | Ht 69.0 in | Wt 187.0 lb

## 2018-04-13 DIAGNOSIS — M25561 Pain in right knee: Secondary | ICD-10-CM

## 2018-04-13 DIAGNOSIS — G8929 Other chronic pain: Secondary | ICD-10-CM | POA: Diagnosis not present

## 2018-04-13 MED ORDER — VITAMIN D (ERGOCALCIFEROL) 1.25 MG (50000 UNIT) PO CAPS: 50000.0000 [IU] | ORAL_CAPSULE | ORAL | 0 refills | Status: DC

## 2018-04-13 NOTE — Assessment & Plan Note (Signed)
Patient given an injection and tolerated the procedure well.  We discussed icing regimen and home exercises.  Discussed which activities of doing which wants to avoid.  Topical anti-inflammatories given.  Follow-up again 4 to 8 weeks

## 2018-04-13 NOTE — Patient Instructions (Signed)
Good to see you  I am sorry you are not better Injected the knee today  Xray downstairs Wear brace with hiking Lets hold on my exercises for the next week  See me again in 4 weeks

## 2018-05-09 NOTE — Progress Notes (Signed)
[facility] [address] [place], [place] [address] Phone: [phone] Subjective:     CC: Left thumb pain  NTI:[id number]  Stephanie Schwartz is a 52 y.o. female coming in with complaint of left thumb pain.  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  Worsening pain overall.  States that when it catches or can be severe pain.  Patient describes the pain as a dull, throbbing aching sensation.  Lifting things cause worsening pain.  The pain in the knee is significantly improved.  Still has some discomfort more on the posterior aspect of the leg.  Some to be more likely in the hamstring.  Denies any associated back pain.  Was able to go hiking without any significant difficulty.     Past Medical History:  Diagnosis Date  . ALLERGIC RHINITIS 03/01/2008  . Anxiety 02/25/2012  . ASTHMA    "allergy induced"  . ASTHMATIC BRONCHITIS, ACUTE 10/14/2008  . Heart murmur    "maybe" (12/02/2014)  . HYPERCHOLESTEROLEMIA 08/21/2007  . HYPOTHYROIDISM   . OTITIS MEDIA, SEROUS, ACUTE, LEFT 03/15/2008  . SHINGLES 06/07/2008  . Wheezing 10/14/2009   Past Surgical History:  Procedure Laterality Date  . APPENDECTOMY  12/02/2014   lap  . LAPAROSCOPIC APPENDECTOMY N/A 12/02/2014   Procedure: APPENDECTOMY LAPAROSCOPIC;  Surgeon: Ralene Ok, MD;  Location: [facility];  Service: General;  Laterality: N/A;  . REDUCTION MAMMAPLASTY Bilateral ~ 1995  . SHOULDER ARTHROSCOPY Left ~ 2009   Social History   Socioeconomic History  . Marital status: Divorced    Spouse name: Not on file  . Number of children: 1  . Years of education: Not on file  . Highest education level: Not on file  Occupational History  . Not on file  Social Needs  . Financial resource strain: Not on file  . Food insecurity:    Worry: Not on file    Inability: Not on file  . Transportation needs:    Medical: Not on file    Non-medical: Not on file  Tobacco Use  . Smoking status: Never Smoker  . Smokeless  tobacco: Never Used  Substance and Sexual Activity  . Alcohol use: Yes    Alcohol/week: 8.4 oz    Types: 14 Glasses of wine per week  . Drug use: No  . Sexual activity: Yes  Lifestyle  . Physical activity:    Days per week: Not on file    Minutes per session: Not on file  . Stress: Not on file  Relationships  . Social connections:    Talks on phone: Not on file    Gets together: Not on file    Attends religious service: Not on file    Active member of club or organization: Not on file    Attends meetings of clubs or organizations: Not on file    Relationship status: Not on file  Other Topics Concern  . Not on file  Social History Narrative  . Not on file   No Known Allergies Family History  Problem Relation Age of Onset  . Alcohol abuse Other        ETOH  . Cancer Other        Breast Cancer  . Hypertension Other   . Hyperlipidemia Other   . Diabetes Other      Past medical history, social, surgical and family history all reviewed in electronic medical record.  No pertanent information unless stated regarding to the chief complaint.  Review of Systems:Review of systems updated and as accurate as of 05/09/18  No headache, visual changes, nausea, vomiting, diarrhea, constipation, dizziness, abdominal pain, skin rash, fevers, chills, night sweats, weight loss, swollen lymph nodes, body aches, joint swelling, muscle aches, chest pain, shortness of breath, mood changes.   Objective  There were no vitals taken for this visit. Systems examined below as of 05/09/18   General: No apparent distress alert and oriented x3 mood and affect normal, dressed appropriately.  HEENT: Pupils equal, extraocular movements intact  Respiratory: Patient's speak in full sentences and does not appear short of breath  Cardiovascular: No lower extremity edema, non tender, no erythema  Skin: Warm dry intact with no signs of infection or rash on extremities or on axial skeleton.  Abdomen: Soft  nontender  Neuro: Cranial nerves II through XII are intact, neurovascularly intact in all extremities with 2+ DTRs and 2+ pulses.  Lymph: No lymphadenopathy of posterior or anterior cervical chain or axillae bilaterally.  Gait normal with good balance and coordination.  MSK:  Non tender with full range of motion and good stability and symmetric strength and tone of shoulders, elbows,  hip, knee and ankles bilaterally.  Left hand exam shows the patient does have some mild atrophy of the thenar eminence on the left hand.  Positive grind test noted of the [facility].  Patient does have severe laxity of the UCL compared to the contralateral side.  No endpoint palpated.  Procedure: Real-time Ultrasound Guided Injection of left CMC joint Device: GE Logiq Q7 Ultrasound guided injection is preferred based studies that show increased duration, increased effect, greater accuracy, decreased procedural pain, increased response rate, and decreased cost with ultrasound guided versus blind injection.  Verbal informed consent obtained.  Time-out conducted.  Noted no overlying erythema, induration, or other signs of local infection.  Skin prepped in a sterile fashion.  Local anesthesia: Topical Ethyl chloride.  With sterile technique and under real time ultrasound guidance: With a 25-gauge 1 and single patient was injected with 0.5 cc of 0.5% Marcaine and 0.5 cc of Kenalog 40 mg/mL into the [facility] joint Completed without difficulty  Pain immediately resolved suggesting accurate placement of the medication.  Advised to call if fevers/chills, erythema, induration, drainage, or persistent bleeding.  Images permanently stored and available for review in the ultrasound unit.  Impression: Technically successful ultrasound guided injection.   Impression and Recommendations:     This case required medical decision making of moderate complexity.      Note: This dictation was prepared with Dragon dictation along with  smaller phrase technology. Any transcriptional errors that result from this process are unintentional.

## 2018-05-11 ENCOUNTER — Ambulatory Visit: Payer: Self-pay

## 2018-05-11 ENCOUNTER — Ambulatory Visit: Payer: Managed Care, Other (non HMO) | Admitting: Family Medicine

## 2018-05-11 ENCOUNTER — Encounter: Payer: Self-pay | Admitting: Family Medicine

## 2018-05-11 VITALS — BP 120/82 | HR 73 | Ht 69.0 in | Wt 187.0 lb

## 2018-05-11 DIAGNOSIS — M2550 Pain in unspecified joint: Secondary | ICD-10-CM | POA: Diagnosis not present

## 2018-05-11 DIAGNOSIS — G8929 Other chronic pain: Secondary | ICD-10-CM

## 2018-05-11 DIAGNOSIS — M79645 Pain in left finger(s): Principal | ICD-10-CM

## 2018-05-11 DIAGNOSIS — M1812 Unilateral primary osteoarthritis of first carpometacarpal joint, left hand: Secondary | ICD-10-CM | POA: Diagnosis not present

## 2018-05-11 NOTE — Patient Instructions (Signed)
Good to see you  Injected the thumb  We made a brace as well and wear it when doing a lot of activity  Stay active Continue the vitamins See me again in 6 weeks

## 2018-05-11 NOTE — Assessment & Plan Note (Signed)
Patient given injection and tolerated procedure well.  Discussed icing regimen and home exercises.  Discussed which activities to do which wants to avoid.  Increase activity as tolerated.  Patient encouraged with custom bracing and given an EXOS splint this will help with some stability with repetitive activity.  Follow-up again in 4 to 8 weeks

## 2018-05-23 ENCOUNTER — Telehealth: Payer: Self-pay | Admitting: Internal Medicine

## 2018-05-23 NOTE — Telephone Encounter (Signed)
Copied from [place] [id number]. Topic: General - Other >> May 23, 2018  9:02 AM Lennox Solders wrote: Reason for CRM: pt is calling and needs new rx xanax 0.5 mg sent to her new pharm [facility] on [address]

## 2018-05-23 NOTE — Addendum Note (Signed)
Addended by: Earnstine Regal on: 05/23/2018 02:51 PM   Modules accepted: Orders

## 2018-05-23 NOTE — Telephone Encounter (Signed)
Xanax too soon since she was rx total 6 mo supply on Mar 01 2018

## 2018-05-23 NOTE — Telephone Encounter (Signed)
Check [place] registry last filled 04/26/2018, not due until 7/26. Will hold to MD return tomorrow for approval../lm,b

## 2018-05-24 MED ORDER — ALPRAZOLAM 0.5 MG PO TABS: ORAL_TABLET | ORAL | 1 refills | Status: DC

## 2018-05-24 NOTE — Telephone Encounter (Signed)
Pt states that the Xanax is as needed and that's why she is running out early she also would like for the Xanax to be increased so that she wouldn't have to take it as often please give her a call back at [phone]

## 2018-05-24 NOTE — Addendum Note (Signed)
Addended by: Biagio Borg on: 05/24/2018 12:57 PM   Modules accepted: Orders

## 2018-05-24 NOTE — Telephone Encounter (Signed)
[facility] for xanax 0.5 bid prn (instead of once daily prn), but we should not go up on the dose on a regular basis as this will lead to addiction and later withdrawal

## 2018-08-21 ENCOUNTER — Other Ambulatory Visit: Payer: Self-pay | Admitting: Family Medicine

## 2018-08-21 NOTE — Telephone Encounter (Signed)
Refill done.  

## 2018-09-01 DIAGNOSIS — M7542 Impingement syndrome of left shoulder: Secondary | ICD-10-CM | POA: Diagnosis not present

## 2018-09-01 DIAGNOSIS — M25511 Pain in right shoulder: Secondary | ICD-10-CM | POA: Diagnosis not present

## 2018-09-01 DIAGNOSIS — M25512 Pain in left shoulder: Secondary | ICD-10-CM | POA: Diagnosis not present

## 2018-09-21 DIAGNOSIS — Z01419 Encounter for gynecological examination (general) (routine) without abnormal findings: Secondary | ICD-10-CM | POA: Diagnosis not present

## 2018-09-21 DIAGNOSIS — Z6827 Body mass index (BMI) 27.0-27.9, adult: Secondary | ICD-10-CM | POA: Diagnosis not present

## 2018-09-21 DIAGNOSIS — Z1231 Encounter for screening mammogram for malignant neoplasm of breast: Secondary | ICD-10-CM | POA: Diagnosis not present

## 2018-09-25 ENCOUNTER — Other Ambulatory Visit: Payer: Self-pay | Admitting: Obstetrics and Gynecology

## 2018-09-25 DIAGNOSIS — R921 Mammographic calcification found on diagnostic imaging of breast: Secondary | ICD-10-CM

## 2018-09-27 ENCOUNTER — Other Ambulatory Visit: Payer: Managed Care, Other (non HMO)

## 2018-10-03 ENCOUNTER — Ambulatory Visit: Payer: Managed Care, Other (non HMO)

## 2018-10-03 ENCOUNTER — Other Ambulatory Visit: Payer: Self-pay | Admitting: Obstetrics and Gynecology

## 2018-10-03 ENCOUNTER — Ambulatory Visit
Admission: RE | Admit: 2018-10-03 | Discharge: 2018-10-03 | Disposition: A | Discharge status: Home / Self Care | Payer: 59 | Source: Ambulatory Visit | Attending: Obstetrics and Gynecology | Admitting: Obstetrics and Gynecology

## 2018-10-03 DIAGNOSIS — R921 Mammographic calcification found on diagnostic imaging of breast: Secondary | ICD-10-CM | POA: Diagnosis not present

## 2018-10-06 ENCOUNTER — Ambulatory Visit
Admission: RE | Admit: 2018-10-06 | Discharge: 2018-10-06 | Disposition: A | Discharge status: Home / Self Care | Payer: 59 | Source: Ambulatory Visit | Attending: Obstetrics and Gynecology | Admitting: Obstetrics and Gynecology

## 2018-10-06 DIAGNOSIS — R921 Mammographic calcification found on diagnostic imaging of breast: Secondary | ICD-10-CM

## 2018-10-06 DIAGNOSIS — N6012 Diffuse cystic mastopathy of left breast: Secondary | ICD-10-CM | POA: Diagnosis not present

## 2018-10-23 DIAGNOSIS — N6092 Unspecified benign mammary dysplasia of left breast: Secondary | ICD-10-CM | POA: Diagnosis not present

## 2018-11-02 ENCOUNTER — Encounter: Payer: Self-pay | Admitting: Oncology

## 2018-11-02 ENCOUNTER — Telehealth: Payer: Self-pay | Admitting: Oncology

## 2018-11-02 NOTE — Telephone Encounter (Signed)
New referral received from Dr. Malachi Paradise for [facility]. Pt has been cld and scheduled to see Dr. Jana Hakim on 1/23 at 4pm. Pt aware to arrive 30 minutes early. Letter mailed.

## 2018-11-23 ENCOUNTER — Encounter: Payer: 59 | Admitting: Oncology

## 2018-11-23 DIAGNOSIS — N6092 Unspecified benign mammary dysplasia of left breast: Secondary | ICD-10-CM | POA: Insufficient documentation

## 2018-12-21 ENCOUNTER — Other Ambulatory Visit: Payer: Self-pay | Admitting: Internal Medicine

## 2018-12-21 NOTE — Telephone Encounter (Signed)
Done erx 

## 2019-03-06 ENCOUNTER — Ambulatory Visit (INDEPENDENT_AMBULATORY_CARE_PROVIDER_SITE_OTHER): Payer: 59 | Admitting: Internal Medicine

## 2019-03-06 ENCOUNTER — Encounter: Payer: Self-pay | Admitting: Internal Medicine

## 2019-03-06 DIAGNOSIS — F988 Other specified behavioral and emotional disorders with onset usually occurring in childhood and adolescence: Secondary | ICD-10-CM | POA: Diagnosis not present

## 2019-03-06 DIAGNOSIS — F329 Major depressive disorder, single episode, unspecified: Secondary | ICD-10-CM | POA: Diagnosis not present

## 2019-03-06 DIAGNOSIS — Z0001 Encounter for general adult medical examination with abnormal findings: Secondary | ICD-10-CM

## 2019-03-06 DIAGNOSIS — Z0000 Encounter for general adult medical examination without abnormal findings: Secondary | ICD-10-CM

## 2019-03-06 DIAGNOSIS — J45909 Unspecified asthma, uncomplicated: Secondary | ICD-10-CM | POA: Diagnosis not present

## 2019-03-06 DIAGNOSIS — R739 Hyperglycemia, unspecified: Secondary | ICD-10-CM | POA: Diagnosis not present

## 2019-03-06 DIAGNOSIS — F32A Depression, unspecified: Secondary | ICD-10-CM | POA: Insufficient documentation

## 2019-03-06 MED ORDER — ESCITALOPRAM OXALATE 10 MG PO TABS: 10.0000 mg | ORAL_TABLET | Freq: Every day | ORAL | 1 refills | Status: DC

## 2019-03-06 NOTE — Assessment & Plan Note (Signed)
stable overall by history and exam, recent data reviewed with pt, and pt to continue medical treatment as before,  to f/u any worsening symptoms or concerns  

## 2019-03-06 NOTE — Assessment & Plan Note (Addendum)
Mild situational, ok for lexapro 10 qd restart, declines counseling referral,  to f/u any worsening symptoms or concerns  In addition to the time spent performing CPE, I spent an additional 15 minutes face to face,in which greater than 50% of this time was spent in counseling and coordination of care for patient's illness as documented, including the differential dx, treatment, further evaluation and other management of depression, hyperglycemia, ADD, asthma

## 2019-03-06 NOTE — Patient Instructions (Signed)
Please take all new medication as prescribed - the lexapro 10 mg per day  Please continue all other medications as before, and refills have been done if requested.  Please have the pharmacy call with any other refills you may need.  Please continue your efforts at being more active, low cholesterol diet, and weight control.  You are otherwise up to date with prevention measures today.  Please keep your appointments with your specialists as you may have planned  You will be contacted regarding the referral for: colonoscopy  Please go to the LAB in the Basement (turn left off the elevator) for the tests to be done at your convenience  You will be contacted by phone if any changes need to be made immediately.  Otherwise, you will receive a letter about your results with an explanation, but please check with MyChart first.  Please remember to sign up for MyChart if you have not done so, as this will be important to you in the future with finding out test results, communicating by private email, and scheduling acute appointments online when needed.  Please return in 1 year for your yearly visit, or sooner if needed, with Lab testing done 3-5 days before

## 2019-03-06 NOTE — Assessment & Plan Note (Signed)

## 2019-03-06 NOTE — Progress Notes (Signed)
Patient ID: Stephanie Schwartz, female   DOB: 1966/01/17, 53 y.o.   MRN: [id number]  Virtual Visit via Video Note  I connected with Stephanie Schwartz on 03/06/19 at  9:40 AM EDT by a video enabled telemedicine application and verified that I am speaking with the correct person using two identifiers.  Location: Patient: at home Provider: at office   I discussed the limitations of evaluation and management by telemedicine and the availability of in person appointments. The patient expressed understanding and agreed to proceed.  History of Present Illness: Here for wellness and f/u;  Overall doing ok;  Pt denies Chest pain, worsening SOB, DOE, wheezing, orthopnea, PND, worsening LE edema, palpitations, dizziness or syncope.  Pt denies neurological change such as new headache, facial or extremity weakness.  Pt denies polydipsia, polyuria, or low sugar symptoms. Pt states overall good compliance with treatment and medications, good tolerability, and has been trying to follow appropriate diet.. No fever, night sweats, wt loss, loss of appetite, or other constitutional symptoms.  Pt states good ability with ADL's, has low fall risk, home safety reviewed and adequate, no other significant changes in hearing or vision, and only occasionally active with exercise.  Also, Having increased financial stress with less hours during pandemic, has 2 children, with mild worsening depression without SI or HI, Denies suicidal ideation, or panic; has ongoing anxiety, also midl worsening.  Had weaned herself off lexapro last yr but asking to restart Past Medical History:  Diagnosis Date  . ALLERGIC RHINITIS 03/01/2008  . Anxiety 02/25/2012  . ASTHMA    "allergy induced"  . ASTHMATIC BRONCHITIS, ACUTE 10/14/2008  . Heart murmur    "maybe" (12/02/2014)  . HYPERCHOLESTEROLEMIA 08/21/2007  . HYPOTHYROIDISM   . OTITIS MEDIA, SEROUS, ACUTE, LEFT 03/15/2008  . SHINGLES 06/07/2008  . Wheezing 10/14/2009   Past Surgical History:   Procedure Laterality Date  . APPENDECTOMY  12/02/2014   lap  . LAPAROSCOPIC APPENDECTOMY N/A 12/02/2014   Procedure: APPENDECTOMY LAPAROSCOPIC;  Surgeon: Ralene Ok, MD;  Location: [facility];  Service: General;  Laterality: N/A;  . REDUCTION MAMMAPLASTY Bilateral ~ 1995  . SHOULDER ARTHROSCOPY Left ~ 2009    reports that she has never smoked. She has never used smokeless tobacco. She reports current alcohol use of about 14.0 standard drinks of alcohol per week. She reports that she does not use drugs. family history includes Alcohol abuse in an other family member; Breast cancer in her maternal aunt; Cancer in an other family member; Diabetes in an other family member; Hyperlipidemia in an other family member; Hypertension in an other family member. No Known Allergies Current Outpatient Medications on File Prior to Visit  Medication Sig Dispense Refill  . ALPRAZolam (XANAX) 0.5 MG tablet TAKE ONE TABLET BY MOUTH TWICE A DAY AS NEEDED FOR ANXIETY 180 tablet 0  . budesonide-formoterol (SYMBICORT) 160-4.5 MCG/ACT inhaler INHALE 2 PUFFS INTO THE LUNGS 2 (TWO) TIMES DAILY. 6 g 8  . Diclofenac Sodium 2 % SOLN Place 2 g onto the skin 2 (two) times daily. 112 g 3  . ibuprofen (ADVIL,MOTRIN) 200 MG tablet Take 200 mg by mouth every 6 (six) hours as needed for mild pain or moderate pain.    Marland Kitchen levothyroxine (SYNTHROID, LEVOTHROID) 200 MCG tablet     . montelukast (SINGULAIR) 10 MG tablet Take 1 tablet (10 mg total) by mouth daily. 90 tablet 3  . Vitamin D, Ergocalciferol, (DRISDOL) 50000 units CAPS capsule TAKE 1 CAPSULE BY MOUTH ONCE A WEEK  12 capsule 0  . zaleplon (SONATA) 5 MG capsule TAKE ONE CAPSULE BY MOUTH EVERY NIGHT AT BEDTIME AS NEEDED FOR SLEEP 30 capsule 5   No current facility-administered medications on file prior to visit.     Observations/Objective: Alert, NAD, appropriate mood and affect, resps normal, cn 2-12 intact, moves all 4s, no visible rash or swelling Lab Results  Component  Value Date   WBC 6.1 03/01/2018   HGB 13.5 03/01/2018   HCT 39.6 03/01/2018   PLT 286.0 03/01/2018   GLUCOSE 91 03/01/2018   CHOL 213 (H) 03/01/2018   TRIG 184.0 (H) 03/01/2018   HDL 54.00 03/01/2018   LDLDIRECT 133.0 07/03/2015   LDLCALC 123 (H) 03/01/2018   ALT 19 03/01/2018   AST 14 03/01/2018   NA 137 03/01/2018   K 4.0 03/01/2018   CL 106 03/01/2018   CREATININE 0.82 03/01/2018   BUN 19 03/01/2018   CO2 23 03/01/2018   TSH 0.06 (L) 03/01/2018   HGBA1C 5.4 03/01/2018   Assessment and Plan: See notes  Follow Up Instructions: See notes   I discussed the assessment and treatment plan with the patient. The patient was provided an opportunity to ask questions and all were answered. The patient agreed with the plan and demonstrated an understanding of the instructions.   The patient was advised to call back or seek an in-person evaluation if the symptoms worsen or if the condition fails to improve as anticipated.   Cathlean Cower, MD

## 2019-03-31 ENCOUNTER — Other Ambulatory Visit: Payer: Self-pay | Admitting: Internal Medicine

## 2019-04-06 ENCOUNTER — Encounter: Payer: Self-pay | Admitting: Gastroenterology

## 2019-05-02 HISTORY — PX: COLONOSCOPY: SHX174

## 2019-05-03 ENCOUNTER — Ambulatory Visit: Payer: 59 | Admitting: *Deleted

## 2019-05-03 ENCOUNTER — Other Ambulatory Visit: Payer: Self-pay

## 2019-05-03 VITALS — Ht 69.0 in | Wt 180.0 lb

## 2019-05-03 DIAGNOSIS — Z1211 Encounter for screening for malignant neoplasm of colon: Secondary | ICD-10-CM

## 2019-05-03 MED ORDER — SUPREP BOWEL PREP KIT 17.5-3.13-1.6 GM/177ML PO SOLN: 1.0000 | Freq: Once | ORAL | 0 refills | Status: AC

## 2019-05-03 NOTE — Progress Notes (Signed)
No egg or soy allergy known to patient   issues with past sedation with any surgeries  or procedures PONV , no intubation problems  No diet pills per patient No home 02 use per patient  No blood thinners per patient  Pt denies issues with constipation  No A fib or A flutter  EMMI video sent to pt's e mail   Pt verified name, DOB, address and insurance during PV today. Pt mailed instruction packet to included paper to complete and mail back to [facility] with addressed and stamped envelope, Emmi video, copy of consent form to read and not return, and instructions. Suprep 15 coupon mailed in packet. PV completed over the phone. Pt encouraged to call with questions or issues   Pt is aware that care partner will wait in the car during proceudre; if they feel like they will be too hot to wait in the car; they may wait in the lobby.  We want them to wear a mask (we do not have any that we can provide them), practice social distancing, and we will check their temperatures when they get here.  I did remind patient that their care partner needs to stay in the parking lot the entire time. Pt will wear mask into building.

## 2019-05-07 ENCOUNTER — Encounter: Payer: Self-pay | Admitting: Gastroenterology

## 2019-05-14 ENCOUNTER — Telehealth: Payer: Self-pay | Admitting: Gastroenterology

## 2019-05-14 NOTE — Telephone Encounter (Signed)
Pt is scheduled for procedure Friday and reported that she had bread with seeds inside and almond butter.  She also stated that she was in [facility] for work.  Please advise.

## 2019-05-14 NOTE — Telephone Encounter (Signed)
Left message for pt, we do not need to cancel the procedure or it will not hurt her due to fiber or seeds. Try to avoid fiber for 5 days and drink plenty of liquids.  Informed pt to let us know if she was exposed to anyone with COVID-19, did she wear a mask and did she practice social distancing while in [facility] or has any symptoms, please notify our office. Gwyndolyn Saxon in [facility]

## 2019-05-15 NOTE — Telephone Encounter (Signed)
Pt returned call and was given message.

## 2019-05-17 ENCOUNTER — Telehealth: Payer: Self-pay | Admitting: Gastroenterology

## 2019-05-17 NOTE — Telephone Encounter (Signed)
Spoke with patient regarding Covid-19 screening questions °Covid-19 Screening Questions: ° °Do you now or have you had a fever in the last 14 days? no ° °Do you have any respiratory symptoms of shortness of breath or  °cough now or in the last 14 days? No   ° °Do you have any family members or close contacts with diagnosed or suspected Covid-19 in the past 14 days? no ° °Have you been tested for Covid-19 and found to be positive?  no ° °Pt made aware of that care partner may wait in the car or come up to the lobby during the procedure but will need to provide their own mask. °

## 2019-05-18 ENCOUNTER — Ambulatory Visit (AMBULATORY_SURGERY_CENTER): Payer: 59 | Admitting: Gastroenterology

## 2019-05-18 ENCOUNTER — Encounter: Payer: Self-pay | Admitting: Gastroenterology

## 2019-05-18 ENCOUNTER — Other Ambulatory Visit: Payer: Self-pay

## 2019-05-18 VITALS — BP 106/69 | HR 66 | Temp 98.5°F | Resp 14 | Ht 69.0 in | Wt 180.0 lb

## 2019-05-18 DIAGNOSIS — K635 Polyp of colon: Secondary | ICD-10-CM

## 2019-05-18 DIAGNOSIS — Z1211 Encounter for screening for malignant neoplasm of colon: Secondary | ICD-10-CM | POA: Diagnosis present

## 2019-05-18 DIAGNOSIS — K6389 Other specified diseases of intestine: Secondary | ICD-10-CM | POA: Diagnosis not present

## 2019-05-18 DIAGNOSIS — D120 Benign neoplasm of cecum: Secondary | ICD-10-CM

## 2019-05-18 MED ORDER — SODIUM CHLORIDE 0.9 % IV SOLN: 500.0000 mL | Freq: Once | INTRAVENOUS | Status: DC

## 2019-05-18 NOTE — Progress Notes (Signed)
Pt's states no medical or surgical changes since previsit or office visit.  Temp-courtney [place]  Vital signs-judy branson 

## 2019-05-18 NOTE — Progress Notes (Signed)
Called to room to assist during endoscopic procedure.  Patient ID and intended procedure confirmed with present staff. Received instructions for my participation in the procedure from the performing physician.  

## 2019-05-18 NOTE — Op Note (Signed)
[facility] Patient Name: Stephanie Schwartz Procedure Date: 05/18/2019 7:33 AM MRN: [id number] Endoscopist: Mallie Mussel L. Loletha Carrow , MD Age: 53 Referring MD:  Date of Birth: 1966-01-09 Gender: Female Account #: [id number] Procedure:                Colonoscopy Indications:              Screening for colorectal malignant neoplasm, This                            is the patient's first colonoscopy Medicines:                Monitored Anesthesia Care Procedure:                Pre-Anesthesia Assessment:                           - Prior to the procedure, a History and Physical                            was performed, and patient medications and                            allergies were reviewed. The patient's tolerance of                            previous anesthesia was also reviewed. The risks                            and benefits of the procedure and the sedation                            options and risks were discussed with the patient.                            All questions were answered, and informed consent                            was obtained. Prior Anticoagulants: The patient has                            taken no previous anticoagulant or antiplatelet                            agents. ASA Grade Assessment: II - A patient with                            mild systemic disease. After reviewing the risks                            and benefits, the patient was deemed in                            satisfactory condition to undergo the procedure.  After obtaining informed consent, the colonoscope                            was passed under direct vision. Throughout the                            procedure, the patient's blood pressure, pulse, and                            oxygen saturations were monitored continuously. The                            Model CF-HQ190L [id number]) scope was introduced                            through the anus and  advanced to the the cecum,                            identified by appendiceal orifice and ileocecal                            valve. The colonoscopy was performed without                            difficulty. The patient tolerated the procedure                            well. The quality of the bowel preparation was                            excellent. The ileocecal valve, appendiceal                            orifice, and rectum were photographed. Scope In: 7:38:52 AM Scope Out: 8:25:13 AM Scope Withdrawal Time: 0 hours 41 minutes 39 seconds  Total Procedure Duration: 0 hours 46 minutes 21 seconds  Findings:                 The perianal and digital rectal examinations were                            normal.                           A diminutive polyp was found in the cecum. The                            polyp was sessile. The polyp was removed with a                            cold snare. Resection and retrieval were complete.                           A sessile partially obstructing mass was found in  the recto-sigmoid colon (about 18cm from anal                            verge, just above the most proximal rectal valve                            fold. Location and morphology made visualization                            and biopsies challenging. The adult colonoscope was                            able to pass with mild resistance. The mass was                            partially circumferential (involving one-half of                            the lumen circumference), and measured                            approximately three to four cm in length. No                            bleeding was present. Many biopsies were taken with                            a cold forceps for histology. Area was tattooed                            with an injection of 3 mL of Spot (carbon black) -                            two spots each 3-5cm proximal and distal  to the                            mass.                           The exam was otherwise without abnormality on                            direct and retroflexion views. Complications:            No immediate complications. Estimated Blood Loss:     Estimated blood loss was minimal. Impression:               - One diminutive polyp in the cecum, removed with a                            cold snare. Resected and retrieved.                           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 Biopsied. Tattooed.                           - The examination was otherwise normal on direct                            and retroflexion views. Recommendation:           - Patient has a contact number available for                            emergencies. The signs and symptoms of potential                            delayed complications were discussed with the                            patient. Return to normal activities tomorrow.                            Written discharge instructions were provided to the                            patient.                           - Resume previous diet.                           - Continue present medications.                           - Await pathology results, then further                            recommendations.                           - Repeat colonoscopy is recommended for                            surveillance. The colonoscopy date will be                            determined after pathology results from today's                            exam become available for review. Seve Monette L. Loletha Carrow, MD 05/18/2019 8:35:35 AM This report has been signed electronically.

## 2019-05-18 NOTE — Progress Notes (Signed)
Report given to PACU, vss 

## 2019-05-18 NOTE — Patient Instructions (Signed)
Handout given for polyps.  YOU HAD AN ENDOSCOPIC PROCEDURE TODAY AT THE [facility]:   Refer to the procedure report that was given to you for any specific questions about what was found during the examination.  If the procedure report does not answer your questions, please call your gastroenterologist to clarify.  If you requested that your care partner not be given the details of your procedure findings, then the procedure report has been included in a sealed envelope for you to review at your convenience later.  YOU SHOULD EXPECT: Some feelings of bloating in the abdomen. Passage of more gas than usual.  Walking can help get rid of the air that was put into your GI tract during the procedure and reduce the bloating. If you had a lower endoscopy (such as a colonoscopy or flexible sigmoidoscopy) you may notice spotting of blood in your stool or on the toilet paper. If you underwent a bowel prep for your procedure, you may not have a normal bowel movement for a few days.  Please Note:  You might notice some irritation and congestion in your nose or some drainage.  This is from the oxygen used during your procedure.  There is no need for concern and it should clear up in a day or so.  SYMPTOMS TO REPORT IMMEDIATELY:   Following lower endoscopy (colonoscopy or flexible sigmoidoscopy):  Excessive amounts of blood in the stool  Significant tenderness or worsening of abdominal pains  Swelling of the abdomen that is new, acute  Fever of 100F or higher  For urgent or emergent issues, a gastroenterologist can be reached at any hour by calling [phone].   DIET:  We do recommend a small meal at first, but then you may proceed to your regular diet.  Drink plenty of fluids but you should avoid alcoholic beverages for 24 hours.  ACTIVITY:  You should plan to take it easy for the rest of today and you should NOT DRIVE or use heavy machinery until tomorrow (because of the sedation  medicines used during the test).    FOLLOW UP: Our staff will call the number listed on your records 48-72 hours following your procedure to check on you and address any questions or concerns that you may have regarding the information given to you following your procedure. If we do not reach you, we will leave a message.  We will attempt to reach you two times.  During this call, we will ask if you have developed any symptoms of COVID 19. If you develop any symptoms (ie: fever, flu-like symptoms, shortness of breath, cough etc.) before then, please call [phone].  If you test positive for Covid 19 in the 2 weeks post procedure, please call and report this information to us.    If any biopsies were taken you will be contacted by phone or by letter within the next 1-3 weeks.  Please call us at [phone] if you have not heard about the biopsies in 3 weeks.    SIGNATURES/CONFIDENTIALITY: You and/or your care partner have signed paperwork which will be entered into your electronic medical record.  These signatures attest to the fact that that the information above on your After Visit Summary has been reviewed and is understood.  Full responsibility of the confidentiality of this discharge information lies with you and/or your care-partner. 

## 2019-05-21 ENCOUNTER — Other Ambulatory Visit: Payer: Self-pay | Admitting: *Deleted

## 2019-05-21 ENCOUNTER — Telehealth: Payer: Self-pay | Admitting: Gastroenterology

## 2019-05-21 DIAGNOSIS — K529 Noninfective gastroenteritis and colitis, unspecified: Secondary | ICD-10-CM

## 2019-05-21 NOTE — Telephone Encounter (Signed)
I received a verbal report from Dr. Vic Ripper of pathology (final report will reportedly come later today or early tomorrow), stating that the biopsies from last week's colonoscopy did not show any malignancy, but that there is chronic nonspecific inflammation.  I spoke with Stephanie Schwartz about this, and she is naturally relieved. (She has felt well, and without chronic abdominal or pelvic pain.  Occasional loose stool that she attributed to food.) The cause of this colonoscopy and pathology finding is unknown, and she needs a CT abdomen and pelvis with oral and IV contrast for further investigation.  She is agreeable to that, and was already sent home from the [facility] with 2 bottles of oral contrast, awaiting instructions.  Please contact her and help make the arrangements for the CT scan.

## 2019-05-22 ENCOUNTER — Telehealth: Payer: Self-pay

## 2019-05-22 ENCOUNTER — Encounter: Payer: Self-pay | Admitting: *Deleted

## 2019-05-22 ENCOUNTER — Other Ambulatory Visit (INDEPENDENT_AMBULATORY_CARE_PROVIDER_SITE_OTHER): Payer: 59

## 2019-05-22 ENCOUNTER — Encounter: Payer: Self-pay | Admitting: Gastroenterology

## 2019-05-22 ENCOUNTER — Other Ambulatory Visit: Payer: Self-pay | Admitting: *Deleted

## 2019-05-22 ENCOUNTER — Telehealth: Payer: Self-pay | Admitting: *Deleted

## 2019-05-22 DIAGNOSIS — K529 Noninfective gastroenteritis and colitis, unspecified: Secondary | ICD-10-CM | POA: Diagnosis not present

## 2019-05-22 LAB — CREATININE, SERUM: Creatinine, Ser: 0.77 mg/dL (ref 0.40–1.20)

## 2019-05-22 LAB — BUN: BUN: 13 mg/dL (ref 6–23)

## 2019-05-22 NOTE — Telephone Encounter (Signed)
Left message on follow up call. 

## 2019-05-22 NOTE — Telephone Encounter (Signed)
Patient scheduled for CT abd/pelvis with oral and IV contrast on 05/25/2019 at 2:30. Instructions sent via MyChart. Called patient, no answer, left message. Patient also told on VM and via MyChart to come in for labs (BUN and Creatinine) prior to CT.

## 2019-05-22 NOTE — Telephone Encounter (Signed)
Attempted to call the patient again and let her know of the scheduled CT and instructions for the contrast. Labs also placed in Epic for BUN and creatinine. Left message for patient to call back.

## 2019-05-22 NOTE — Telephone Encounter (Signed)
  Follow up Call-  Call back number 05/18/2019  Post procedure Call Back phone  # [phone]  Permission to leave phone message Yes  Some recent data might be hidden     Patient questions:  Do you have a fever, pain , or abdominal swelling? No. Pain Score  0 *  Have you tolerated food without any problems? Yes.    Have you been able to return to your normal activities? Yes.    Do you have any questions about your discharge instructions: Diet   No. Medications  No. Follow up visit  No.  Do you have questions or concerns about your Care? No.  Actions: * If pain score is 4 or above: No action needed, pain <4.  1. Have you developed a fever since your procedure? no  2.   Have you had an respiratory symptoms (SOB or cough) since your procedure? no  3.   Have you tested positive for COVID 19 since your procedure no  4.   Have you had any family members/close contacts diagnosed with the COVID 19 since your procedure?  no   If yes to any of these questions please route to [name], RN and Alphonsa Gin, Therapist, sports.

## 2019-05-23 NOTE — Telephone Encounter (Signed)
This RN checked MyChart to see if the patient read her previously unread message with the instructions for the CT. The patient has read and received the instructions for the CT.

## 2019-05-25 ENCOUNTER — Ambulatory Visit (HOSPITAL_COMMUNITY)
Admission: RE | Admit: 2019-05-25 | Discharge: 2019-05-25 | Disposition: A | Discharge status: Home / Self Care | Payer: 59 | Source: Ambulatory Visit | Attending: Gastroenterology | Admitting: Gastroenterology

## 2019-05-25 ENCOUNTER — Other Ambulatory Visit: Payer: Self-pay

## 2019-05-25 DIAGNOSIS — K529 Noninfective gastroenteritis and colitis, unspecified: Secondary | ICD-10-CM | POA: Diagnosis present

## 2019-05-25 MED ORDER — IOHEXOL 300 MG/ML  SOLN
100.0000 mL | Freq: Once | INTRAMUSCULAR | Status: AC | PRN
  Administered 2019-05-25: 15:00:00 100 mL via INTRAVENOUS

## 2019-05-25 MED ORDER — SODIUM CHLORIDE (PF) 0.9 % IJ SOLN
INTRAMUSCULAR | Status: AC
  Filled 2019-05-25: qty 50

## 2019-05-30 ENCOUNTER — Telehealth: Payer: Self-pay | Admitting: *Deleted

## 2019-05-30 NOTE — Telephone Encounter (Signed)
FYI- Followed up with Lynnell Chad, the office of Dr. Brien Few, [phone], the patient has been scheduled to be seen at 10:00 am on Monday, 06/04/2019.  Will continue to follow up with [facility] regarding surgical colorectal referral.

## 2019-06-05 ENCOUNTER — Telehealth: Payer: Self-pay

## 2019-06-05 ENCOUNTER — Telehealth: Payer: Self-pay | Admitting: *Deleted

## 2019-06-05 NOTE — Telephone Encounter (Signed)
FYI- Patient has an appointment with Dr. Dema Severin at [facility] on 06/06/2019 at 9:15 am concerning colorectal surgery for colon mass.

## 2019-06-05 NOTE — Telephone Encounter (Signed)
Patient is scheduled for a new patient appointment with Dr. Denman George on 06-13-19 at 1130.  She is to arrive at 1100 to register. Reviewed valet,registration, and visit  Process. Pt verbalized understanding.

## 2019-06-05 NOTE — Telephone Encounter (Signed)
LM for Seth Bake at [facility] of patient's appointment date, time and that she has been notified of appointment.

## 2019-06-08 ENCOUNTER — Other Ambulatory Visit: Payer: Self-pay | Admitting: Internal Medicine

## 2019-06-08 NOTE — Telephone Encounter (Signed)
Done erx 

## 2019-06-13 ENCOUNTER — Other Ambulatory Visit: Payer: Self-pay

## 2019-06-13 ENCOUNTER — Inpatient Hospital Stay: Payer: 59 | Attending: Gynecologic Oncology | Admitting: Gynecologic Oncology

## 2019-06-13 ENCOUNTER — Encounter: Payer: Self-pay | Admitting: Gynecologic Oncology

## 2019-06-13 ENCOUNTER — Other Ambulatory Visit: Payer: Self-pay | Admitting: Gynecologic Oncology

## 2019-06-13 ENCOUNTER — Telehealth: Payer: Self-pay | Admitting: Gynecologic Oncology

## 2019-06-13 VITALS — BP 111/50 | HR 62 | Temp 98.3°F | Resp 18 | Ht 69.0 in | Wt 186.1 lb

## 2019-06-13 DIAGNOSIS — N921 Excessive and frequent menstruation with irregular cycle: Secondary | ICD-10-CM | POA: Insufficient documentation

## 2019-06-13 DIAGNOSIS — N838 Other noninflammatory disorders of ovary, fallopian tube and broad ligament: Secondary | ICD-10-CM | POA: Insufficient documentation

## 2019-06-13 DIAGNOSIS — Z79899 Other long term (current) drug therapy: Secondary | ICD-10-CM | POA: Diagnosis not present

## 2019-06-13 DIAGNOSIS — R011 Cardiac murmur, unspecified: Secondary | ICD-10-CM | POA: Insufficient documentation

## 2019-06-13 DIAGNOSIS — M1990 Unspecified osteoarthritis, unspecified site: Secondary | ICD-10-CM | POA: Diagnosis not present

## 2019-06-13 DIAGNOSIS — E039 Hypothyroidism, unspecified: Secondary | ICD-10-CM | POA: Insufficient documentation

## 2019-06-13 DIAGNOSIS — K219 Gastro-esophageal reflux disease without esophagitis: Secondary | ICD-10-CM | POA: Diagnosis not present

## 2019-06-13 DIAGNOSIS — D3911 Neoplasm of uncertain behavior of right ovary: Secondary | ICD-10-CM | POA: Diagnosis not present

## 2019-06-13 DIAGNOSIS — F419 Anxiety disorder, unspecified: Secondary | ICD-10-CM | POA: Diagnosis not present

## 2019-06-13 DIAGNOSIS — E7800 Pure hypercholesterolemia, unspecified: Secondary | ICD-10-CM | POA: Diagnosis not present

## 2019-06-13 DIAGNOSIS — N920 Excessive and frequent menstruation with regular cycle: Secondary | ICD-10-CM | POA: Insufficient documentation

## 2019-06-13 DIAGNOSIS — D375 Neoplasm of uncertain behavior of rectum: Secondary | ICD-10-CM

## 2019-06-13 DIAGNOSIS — D3912 Neoplasm of uncertain behavior of left ovary: Secondary | ICD-10-CM | POA: Diagnosis not present

## 2019-06-13 MED ORDER — IBUPROFEN 800 MG PO TABS: 800.0000 mg | ORAL_TABLET | Freq: Three times a day (TID) | ORAL | 1 refills | Status: DC | PRN

## 2019-06-13 MED ORDER — OXYCODONE HCL 5 MG PO TABS: 5.0000 mg | ORAL_TABLET | ORAL | 0 refills | Status: DC | PRN

## 2019-06-13 NOTE — Telephone Encounter (Signed)
Left message for Stephanie Schwartz with surgery scheduling at [facility] about scheduling a joint procedure with Dr. Denman George and Dr. Dema Severin.

## 2019-06-13 NOTE — Patient Instructions (Signed)
Preparing for your Surgery  Plan for surgery on July 05, 2019 (if Dr. Dema Severin is available on this date) with Dr. Everitt Amber and Dr. Nadeen Landau at [place] will be scheduled for a robotic assisted total laparoscopic hysterectomy, bilateral salpingo-oophorectomy, possible staging.   Pre-operative Testing -You will receive a phone call from presurgical testing at [facility] if you have not received a call already to arrange for a pre-operative testing appointment before your surgery.  This appointment normally occurs one to two weeks before your scheduled surgery.   -Bring your insurance card, copy of an advanced directive if applicable, medication list  -At that visit, you will be asked to sign a consent for a possible blood transfusion in case a transfusion becomes necessary during surgery.  The need for a blood transfusion is rare but having consent is a necessary part of your care.     -You should not be taking blood thinners or aspirin at least ten days prior to surgery unless instructed by your surgeon.  -Do not take supplements such as fish oil (omega 3), red yeast rice, tumeric before your surgery.  Day Before Surgery at [facility] ADVISED OF A DIFFERENT DIET PER DR. WHITE. IF SO, DISREGARD THE FOLLOWING: -You will be asked to take in a light diet the day before surgery.  Avoid carbonated beverages.  You will be advised to have nothing to eat or drink after midnight the evening before.    Eat a light diet the day before surgery.  Examples including soups, broths, toast, yogurt, mashed potatoes.  Things to avoid include carbonated beverages (fizzy beverages), raw fruits and raw vegetables, or beans.   If your bowels are filled with gas, your surgeon will have difficulty visualizing your pelvic organs which increases your surgical risks.  Your role in recovery Your role is to become active as soon as directed by your doctor, while still giving  yourself time to heal.  Rest when you feel tired. You will be asked to do the following in order to speed your recovery:  - Cough and breathe deeply. This helps toclear and expand your lungs and can prevent pneumonia. You may be given a spirometer to practice deep breathing. A staff member will show you how to use the spirometer. - Do mild physical activity. Walking or moving your legs help your circulation and body functions return to normal. A staff member will help you when you try to walk and will provide you with simple exercises. Do not try to get up or walk alone the first time. - Actively manage your pain. Managing your pain lets you move in comfort. We will ask you to rate your pain on a scale of zero to 10. It is your responsibility to tell your doctor or nurse where and how much you hurt so your pain can be treated.  Special Considerations -If you are diabetic, you may be placed on insulin after surgery to have closer control over your blood sugars to promote healing and recovery.  This does not mean that you will be discharged on insulin.  If applicable, your oral antidiabetics will be resumed when you are tolerating a solid diet.  -Your final pathology results from surgery should be available around one week after surgery and the results will be relayed to you when available.  -Dr. Lahoma Crocker is the surgeon that assists your GYN Oncologist with surgery.  If you end up staying the night, the  next day after your surgery you will either see Dr. Denman George or Dr. Lahoma Crocker.  -FMLA forms can be faxed to [phone] and please allow 5-7 business days for completion.  Pain Management After Surgery -You have been prescribed your pain medication and bowel regimen medications before surgery so that you can have these available when you are discharged from the hospital. The pain medication is for use ONLY AFTER surgery and a new prescription will not be given.   -Make sure that you  have Tylenol and Ibuprofen at home to use on a regular basis after surgery for pain control. We recommend alternating the medications every hour to six hours since they work differently and are processed in the body differently for pain relief.  -Review the attached handout on narcotic use and their risks and side effects.   Bowel Regimen -You have been prescribed Sennakot-S to take nightly to prevent constipation especially if you are taking the narcotic pain medication intermittently.  It is important to prevent constipation and drink adequate amounts of liquids.  Blood Transfusion Information WHAT IS A BLOOD TRANSFUSION? A transfusion is the replacement of blood or some of its parts. Blood is made up of multiple cells which provide different functions.  Red blood cells carry oxygen and are used for blood loss replacement.  White blood cells fight against infection.  Platelets control bleeding.  Plasma helps clot blood.  Other blood products are available for specialized needs, such as hemophilia or other clotting disorders. BEFORE THE TRANSFUSION  Who gives blood for transfusions?   You may be able to donate blood to be used at a later date on yourself (autologous donation).  Relatives can be asked to donate blood. This is generally not any safer than if you have received blood from a stranger. The same precautions are taken to ensure safety when a relative's blood is donated.  Healthy volunteers who are fully evaluated to make sure their blood is safe. This is blood bank blood. Transfusion therapy is the safest it has ever been in the practice of medicine. Before blood is taken from a donor, a complete history is taken to make sure that person has no history of diseases nor engages in risky social behavior (examples are intravenous drug use or sexual activity with multiple partners). The donor's travel history is screened to minimize risk of transmitting infections, such as malaria.  The donated blood is tested for signs of infectious diseases, such as HIV and hepatitis. The blood is then tested to be sure it is compatible with you in order to minimize the chance of a transfusion reaction. If you or a relative donates blood, this is often done in anticipation of surgery and is not appropriate for emergency situations. It takes many days to process the donated blood. RISKS AND COMPLICATIONS Although transfusion therapy is very safe and saves many lives, the main dangers of transfusion include:   Getting an infectious disease.  Developing a transfusion reaction. This is an allergic reaction to something in the blood you were given. Every precaution is taken to prevent this. The decision to have a blood transfusion has been considered carefully by your caregiver before blood is given. Blood is not given unless the benefits outweigh the risks.  AFTER SURGERY CONSIDERATIONS  06/13/2019  Return to work: 4-6 weeks if applicable  Activity: 1. Be up and out of the bed during the day.  Take a nap if needed.  You may walk up steps but be careful  and use the hand rail.  Stair climbing will tire you more than you think, you may need to stop part way and rest.   2. No lifting or straining for 6 weeks.  3. No driving for 1 week(s).  Do not drive if you are taking narcotic pain medicine.  4. Shower daily.  Use soap and water on your incision and pat dry; don't rub.  No tub baths until cleared by your surgeon.   5. No sexual activity and nothing in the vagina for 8 weeks.  6. You may experience a small amount of clear drainage from your incisions, which is normal.  If the drainage persists or increases, please call the office.  7. You may experience vaginal spotting after surgery or around the 6-8 week mark from surgery when the stitches at the top of the vagina begin to dissolve.  The spotting is normal but if you experience heavy bleeding, call our office.  8. Take Tylenol or  ibuprofen first for pain and only use narcotic pain medication for severe pain not relieved by the Tylenol or Ibuprofen.  Monitor your Tylenol intake to a max of 4,000 mg a day.  Diet: 1. Low sodium Heart Healthy Diet is recommended.  2. It is safe to use a laxative, such as Miralax or Colace, if you have difficulty moving your bowels. You can take Sennakot at bedtime every evening to keep bowel movements regular and to prevent constipation.    Wound Care: 1. Keep clean and dry.  Shower daily.  Reasons to call the Doctor:  Fever - Oral temperature greater than 100.4 degrees Fahrenheit  Foul-smelling vaginal discharge  Difficulty urinating  Nausea and vomiting  Increased pain at the site of the incision that is unrelieved with pain medicine.  Difficulty breathing with or without chest pain  New calf pain especially if only on one side  Sudden, continuing increased vaginal bleeding with or without clots.   Contacts: For questions or concerns you should contact:  Dr. Everitt Amber at [phone]  [name], NP at [phone]  After Hours: call [phone] and have the GYN Oncologist paged/contacted

## 2019-06-13 NOTE — Progress Notes (Signed)
Consult Note: Gyn-Onc  Consult was requested by Dr. Ronita Hipps and Dr Dema Severin for the evaluation of Stephanie Schwartz 53 y.o. female  CC:  Chief Complaint  Patient presents with  . Ovarian mass    Assessment/Plan:  Ms. Stephanie Schwartz  is a 64 y.o.  year old with bilateral complex adnexal masses, and a rectal mass, and menorrhagia. There is concern for stage IV endometriosis and a surgery is recommended for both diagnostic and therapeutic purposes. I am recommending a robotic assisted total hysterectomy, BSO, and possible low anterior resection versus anterior discectomy of the anterior rectal wall. Dr Dema Severin will assist with this procedure. We will have a Urologist place either localizing stents vs ICG tracer within the ureters.   I discussed the anticipated procedure and the anticipated risks which are higher when there is adhesive disease with endometriosis causing fusion of pelvic organs. I explained the risk of damage to bowel and urologic structures. I explained that Dr Dema Severin may need to perform a rectal resection and if so, there is a possibility for a temporary diverting stoma.  I explained that deep surgical site infection is a high risk with hysterectomy and colectomy.   The patient was also offered close surveillance with repeat imaging in 44months.  She is electing for surgery to establish definitive diagnosis.    HPI: Ms Stephanie Schwartz is a 55 year old P1 who is seen in consultation at the request of Dr Ronita Hipps and Dr Dema Severin for bilateral adnexal masses and a rectal mass.  The patient was undergoing routine colonoscopic screening and a sessile, partially obstructing mass was found in the rectosigmoid colon about 18 cm from the anal verge, just above the most proximal rectal valve fold.  Its location and morphology made biopsying it challenging.  However biopsies were taken from this mass.  Pathology revealed an inflammatory polyp with no malignancy seen.  A CT scan of the abdomen and  pelvis was then performed on May 25, 2019.  It revealed a 5.1 cm low-attenuation mass in the anterior right hepatic lobe that had been stable from prior 2016 scan consistent with a benign lesion.  In the pelvis the uterus was unremarkable.  There was a complex cystic lesion containing several thin internal septation seen in the left adnexa measuring 5.3 x 4 cm.  It was new from 2016.  A benign-appearing cystic lesion was seen in the right posterior adnexa measuring 4.6 x 3.6 cm and is also new.  There is no ascites.  In the colon a focal soft tissue density was seen along the right lateral colonic wall near the rectosigmoid junction measuring 2.4 x 1.8 cm.  It was suspicious for a colon carcinoma.  There is no evidence of bowel obstruction.  There was no carcinomatosis, no lymphadenopathy.  The patient was then seen by her OB/GYN, Dr. Ronita Hipps, on June 04, 2019.  This revealed a right ovarian complex cyst with diffuse homogeneous internal echoes measuring 4.6 x 3.6 x 3.2 cm with no increased blood flow.  The multiple left ovarian complex cyst with low internal echoes noted measuring 3.5 x 3.3 x 3 cm.  The uterus was grossly normal and measured 10.4 x 6.3 x 4.5 cm.  The cysts were felt to be most consistent ultrasound imaging with an endometrioma.  A Ca1 25 was drawn that same day which was elevated at 57.  The patient reports that she is been beginning to have irregular menses in the year of 2019 and 2020.  When she does have menstrual cycles they are heavier than usual.  She is a lifelong history of intermittent diarrhea that she does not feel is worse than usual.  She denies bloody stools.  She denies that her GI symptoms change at the time of her cycle.  She denies significant cyclical pelvic pain.  She is never been diagnosed with endometriosis.  She has a history of one prior vaginal delivery 16 years ago.  She has a history of a laparoscopic appendectomy with Dr. Rosendo Gros in 2016.  Current Meds:   Outpatient Encounter Medications as of 06/13/2019  Medication Sig  . albuterol (VENTOLIN HFA) 108 (90 Base) MCG/ACT inhaler Inhale into the lungs every 6 (six) hours as needed for wheezing or shortness of breath.  . ALPRAZolam (XANAX) 0.5 MG tablet TAKE ONE TABLET BY MOUTH TWICE A DAY AS NEEDED ANXIETY  . budesonide-formoterol (SYMBICORT) 160-4.5 MCG/ACT inhaler INHALE 2 PUFFS INTO THE LUNGS 2 (TWO) TIMES DAILY.  Marland Kitchen escitalopram (LEXAPRO) 10 MG tablet Take 1 tablet (10 mg total) by mouth daily. Then stop  . levothyroxine (SYNTHROID) 200 MCG tablet levothyroxine 200 mcg tablet  TAKE 1 TABLET PO QD  . montelukast (SINGULAIR) 10 MG tablet TAKE ONE TABLET BY MOUTH DAILY  . [DISCONTINUED] ibuprofen (ADVIL,MOTRIN) 200 MG tablet Take 200 mg by mouth every 6 (six) hours as needed for mild pain or moderate pain.  Marland Kitchen ibuprofen (ADVIL) 800 MG tablet Take 1 tablet (800 mg total) by mouth every 8 (eight) hours as needed for moderate pain. For AFTER surgery only  . oxyCODONE (OXY IR/ROXICODONE) 5 MG immediate release tablet Take 1 tablet (5 mg total) by mouth every 4 (four) hours as needed for severe pain. For AFTER surgery, do not take and drive  . [DISCONTINUED] Diclofenac Sodium 2 % SOLN Place 2 g onto the skin 2 (two) times daily. (Patient not taking: Reported on 05/03/2019)  . [DISCONTINUED] Vitamin D, Ergocalciferol, (DRISDOL) 50000 units CAPS capsule TAKE 1 CAPSULE BY MOUTH ONCE A WEEK (Patient not taking: Reported on 05/03/2019)  . [DISCONTINUED] zaleplon (SONATA) 5 MG capsule TAKE ONE CAPSULE BY MOUTH EVERY NIGHT AT BEDTIME AS NEEDED FOR SLEEP (Patient not taking: Reported on 05/03/2019)   No facility-administered encounter medications on file as of 06/13/2019.     Allergy: No Known Allergies  Social Hx:   Social History   Socioeconomic History  . Marital status: Divorced    Spouse name: Not on file  . Number of children: 1  . Years of education: Not on file  . Highest education level: Not on file   Occupational History  . Not on file  Social Needs  . Financial resource strain: Not on file  . Food insecurity    Worry: Not on file    Inability: Not on file  . Transportation needs    Medical: Not on file    Non-medical: Not on file  Tobacco Use  . Smoking status: Never Smoker  . Smokeless tobacco: Never Used  Substance and Sexual Activity  . Alcohol use: Yes    Alcohol/week: 14.0 standard drinks    Types: 14 Glasses of wine per week  . Drug use: No  . Sexual activity: Yes  Lifestyle  . Physical activity    Days per week: Not on file    Minutes per session: Not on file  . Stress: Not on file  Relationships  . Social Herbalist on phone: Not on file    Gets together:  Not on file    Attends religious service: Not on file    Active member of club or organization: Not on file    Attends meetings of clubs or organizations: Not on file    Relationship status: Not on file  . Intimate partner violence    Fear of current or ex partner: Not on file    Emotionally abused: Not on file    Physically abused: Not on file    Forced sexual activity: Not on file  Other Topics Concern  . Not on file  Social History Narrative  . Not on file    Past Surgical Hx:  Past Surgical History:  Procedure Laterality Date  . cyst removed from neck area     age 63  . LAPAROSCOPIC APPENDECTOMY N/A 12/02/2014   Procedure: APPENDECTOMY LAPAROSCOPIC;  Surgeon: Ralene Ok, MD;  Location: [facility];  Service: General;  Laterality: N/A;  . REDUCTION MAMMAPLASTY Bilateral ~ 1995  . SHOULDER ARTHROSCOPY Left ~ 2009  . WISDOM TOOTH EXTRACTION     with sedation    Past Medical Hx:  Past Medical History:  Diagnosis Date  . ALLERGIC RHINITIS 03/01/2008  . Allergy   . Anxiety 02/25/2012  . Arthritis    left thumb  . ASTHMA    "allergy induced"  . Asthma   . ASTHMATIC BRONCHITIS, ACUTE 10/14/2008  . GERD (gastroesophageal reflux disease)   . Heart murmur    "maybe" (12/02/2014)  .  HYPERCHOLESTEROLEMIA 08/21/2007  . HYPOTHYROIDISM   . OTITIS MEDIA, SEROUS, ACUTE, LEFT 03/15/2008  . PONV (postoperative nausea and vomiting)   . SHINGLES 06/07/2008  . Wheezing 10/14/2009    Past Gynecological History:  SVD x 1 No LMP recorded.  Family Hx:  Family History  Problem Relation Age of Onset  . Alcohol abuse Other        ETOH  . Cancer Other        Breast Cancer  . Hypertension Other   . Hyperlipidemia Other   . Diabetes Other   . Breast cancer Maternal Aunt   . Colon polyps Father   . Colon cancer Neg Hx   . Esophageal cancer Neg Hx   . Rectal cancer Neg Hx   . Stomach cancer Neg Hx     Review of Systems:  Constitutional  Feels well,    ENT Normal appearing ears and nares bilaterally Skin/Breast  No rash, sores, jaundice, itching, dryness Cardiovascular  No chest pain, shortness of breath, or edema  Pulmonary  No cough or wheeze.  Gastro Intestinal  No nausea, vomitting, or diarrhoea. No bright red blood per rectum, no abdominal pain, change in bowel movement, or constipation.  Genito Urinary  No frequency, urgency, dysuria, + heavy menstrual bleeding Musculo Skeletal  No myalgia, arthralgia, joint swelling or pain  Neurologic  No weakness, numbness, change in gait,  Psychology  No depression, anxiety, insomnia.   Vitals:  Blood pressure (!) 111/50, pulse 62, temperature 98.3 F (36.8 C), temperature source Oral, resp. rate 18, height 5\' 9"  (1.753 m), weight 186 lb 1.6 oz (84.4 kg), SpO2 98 %.  Physical Exam: WD in NAD Neck  Supple NROM, without any enlargements.  Lymph Node Survey No cervical supraclavicular or inguinal adenopathy Cardiovascular  Pulse normal rate, regularity and rhythm. S1 and S2 normal.  Lungs  Clear to auscultation bilateraly, without wheezes/crackles/rhonchi. Good air movement.  Skin  No rash/lesions/breakdown  Psychiatry  Alert and oriented to person, place, and time  Abdomen  Normoactive bowel sounds, abdomen  soft, non-tender and nonobese without evidence of hernia.  Back No CVA tenderness Genito Urinary  Vulva/vagina: Normal external female genitalia.  No lesions. No discharge or bleeding.  Bladder/urethra:  No lesions or masses, well supported bladder  Vagina: normal  Cervix: Normal appearing, no lesions.  Uterus: Deviated to the right Small, mobile, no parametrial involvement or nodularity.  Adnexa: no discretely palpable masses. Rectal  Good tone, no masses no cul de sac nodularity.  Extremities  No bilateral cyanosis, clubbing or edema.   Thereasa Solo, MD  06/13/2019, 12:34 PM

## 2019-06-19 ENCOUNTER — Telehealth: Payer: Self-pay

## 2019-06-19 NOTE — Telephone Encounter (Signed)
LM for Stephanie Schwartz that Stephanie John, NP said that she will scheduled the joint case for 08-16-19 with Dr. Dema Schwartz.  Stephanie Schwartz will post the case tomorrow and call her back with the details.

## 2019-06-20 ENCOUNTER — Telehealth: Payer: Self-pay

## 2019-06-20 NOTE — Telephone Encounter (Signed)
Told Stephanie Schwartz that Dr. Dema Severin is not available until August 16, 2019 to do joint case with Dr. Denman George per [name], NP. Pt is agreeable to this date.

## 2019-07-02 ENCOUNTER — Encounter (HOSPITAL_COMMUNITY): Admission: RE | Admit: 2019-07-02 | Payer: 59 | Source: Ambulatory Visit

## 2019-07-02 ENCOUNTER — Ambulatory Visit: Payer: Self-pay | Admitting: Surgery

## 2019-07-02 NOTE — H&P (Signed)
CC: Referred by Dr. Rosendo Gros and Dr. Loletha Carrow for rectosigmoid inflammatory mass - here for f/u  HPI: Stephanie Schwartz is a very pleasant 17yoF with hx of HLD, hypothyroidism, allergies whom presented to Dr. Loletha Carrow for screening colonoscopy at 53yo - 1st scope. This is completed 05/18/2019. She is found to have diminutive polyp in the cecum that was removed. Additionally a sessile, mass in the rectosigmoid colon estimated 18 cm from anal verge just above the most proximal rectal valve. This was biopsied multiple times and tattooed. This returned as an inflammatory polyp without dysplasia or malignancy. Cecal polyp return as a sessile serrated polyp without dysplasia. She denied ever having had any symptoms related to this. She denies cyclical rectal bleeding. She denies rectal bleeding. She does note her stools are somewhat looser during her cycle. She underwent CT abdomen and pelvis 05/26/2019 which demonstrated a 2.4 cm focal colonic soft tissue density near the rectosigmoid junction suspicious for colon carcinoma. Additionally 5.3 cm new cystic lesion in the left adnexa and 4.6 cm right posterior adnexal lesion. Ovarian neoplasm cannot be excluded. 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  She is doing well today. She denies any complaints. She reports being seen by her gynecologist, Dr. Ronita Hipps, with [facility] and 2 days ago had a CA-125 as well as transvaginal ultrasound performed. We do not have copies of this. She reports being told that he saw findings concerning for endometriosis. She has a referral to Dr. Denman George currently pending and is scheduled to see her 06/13/2019.  INTERVAL HX She was seen by Dr. Denman George 06/13/2019 and returns for f/u today. Dr. Denman George has gone over everything with her from a GYN standpoint and has opted to pursue elective surgery to establish definitive diagnosis. She denies any  complaints today or changes in her health history. She is doing well.  PMH: HLD (well controlled with statin), hypothyroidism (well controlled with synthroid), allergies  PSH: Laparoscopic appendectomy-2016 (path showed perforated appendicitis without tumor/malignancy). Denies any other prior abdominal or pelvic surgeries.  FHx: Denies FHx of malignancy including colorectal, breast, or ovarian.  Social: Denies use of tobacco/EtOH/drugs. 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  The patient is a 53 year old female.   Allergies [facility] Metcalf, RMA; 06/25/2019 9:56 AM) No Known Drug Allergies  [10/23/2018]: No Known Allergies  [06/06/2019]: Allergies Reconciled   Medication History [employer], RMA; 06/25/2019 9:56 AM) Vitamin D (Ergocalciferol) (1.25 MG(50000 UT) Capsule, Oral) Active. Levothyroxine Sodium (25MCG Tablet, Oral) Active. Xanax (1MG  Tablet, Oral) Active. Singulair (4MG  Packet, Oral) Active. Escitalopram Oxalate (5MG  Tablet, Oral) Active. Vital-D Rx (1MG  Tablet, Oral) Active. oxyCODONE HCl (5MG  Capsule, Oral) Active. Medications Reconciled    Review of Systems Harrell Gave M. Jabarri Stefanelli MD; 06/25/2019 10:15 AM) General Not Present- Appetite Loss, Chills, Fatigue, Fever, Night Sweats, Weight Gain and Weight Loss. Skin Not Present- Change in Wart/Mole, Dryness, Hives, Jaundice, New Lesions, Non-Healing Wounds, Rash and Ulcer. HEENT Not Present- Earache, Hearing Loss, Hoarseness, Nose Bleed, Oral Ulcers, Ringing in the Ears, Seasonal Allergies, Sinus Pain, Sore Throat, Visual Disturbances, Wears glasses/contact lenses and Yellow Eyes. Respiratory Not Present- Bloody sputum, Chronic Cough, Difficulty Breathing, Snoring  and Wheezing. Cardiovascular Not Present- Chest Pain, Difficulty Breathing Lying  Down, Leg Cramps, Palpitations, Rapid Heart Rate, Shortness of Breath and Swelling of Extremities. Gastrointestinal Not Present- Abdominal Pain, Bloating, Bloody Stool, Change in Bowel Habits, Chronic diarrhea, Constipation, Difficulty Swallowing, Excessive gas, Gets full quickly at meals, Hemorrhoids, Indigestion, Nausea, Rectal Pain and Vomiting. Female Genitourinary Not Present- Frequency, Nocturia, Painful Urination, Pelvic Pain and Urgency. Musculoskeletal Not Present- Back Pain, Joint Pain, Joint Stiffness, Muscle Pain, Muscle Weakness and Swelling of Extremities. Neurological Not Present- Decreased Memory, Fainting, Headaches, Numbness, Seizures, Tingling, Tremor, Trouble walking and Weakness. Psychiatric Not Present- Anxiety, Bipolar, Change in Sleep Pattern, Depression, Fearful and Frequent crying. Endocrine Not Present- Cold Intolerance, Excessive Hunger, Hair Changes, Heat Intolerance, Hot flashes and New Diabetes. Hematology Not Present- Blood Thinners, Easy Bruising, Excessive bleeding, Gland problems, HIV and Persistent Infections.  Vitals Geni Bers Haggett RMA; 06/25/2019 9:57 AM) 06/25/2019 9:56 AM Weight: 188 lb Height: 69in Body Surface Area: 2.01 m Body Mass Index: 27.76 kg/m  Temp.: 98.59F (Temporal)  Pulse: 92 (Regular)  P.OX: 99% (Room air) BP: 120/82(Sitting, Left Arm, Standard)       Physical Exam Harrell Gave M. Amunique Neyra MD; 06/25/2019 10:16 AM) The physical exam findings are as follows: Note: Constitutional: No acute distress; conversant; no deformities Eyes: Moist conjunctiva; no lid lag; anicteric sclerae; pupils equal round and reactive to light Neck: Trachea midline; no palpable thyromegaly Lungs: Normal respiratory effort; no tactile fremitus CV: RRR; no palpable thrill; no pitting edema GI: Abdomen soft, nontender, nondistended; no palpable hepatosplenomegaly MSK:  Normal gait; no clubbing/cyanosis Psychiatric: Appropriate affect; alert and oriented 3 Lymphatic: No palpable cervical or axillary lymphadenopathy    Assessment & Plan Harrell Gave M. 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 Greever is a very pleasant 23yoF with hx of HLD, hypothyroidism, allergies - here today for follow-up evaluation of an inflammatory lesion of her rectosigmoid colon Impression: -Dr. Denman George is planning robotic assisted TAH/BSO and possible removal of affected segment of rectosigmoid colon. -We discussed possibility of low anterior resection vs disk excision of wall based on intraoperative findings. Dr. Denman George is planning ureteral ICG vs stents with urology as well -The anatomy and physiology of the GI tract was discussed at length with the patient. The pathophysiology of her condition was discussed at length with associated pictures using our minimally invasive colorectal surgery handout -The planned procedure, material risks (including, but not limited to, pain, bleeding, infection, scarring, need for blood transfusion, damage to surrounding structures- blood vessels/nerves/viscus/organs, damage to ureter, urine leak, leak from anastomosis, need for additional procedures, need for stoma which may be permanent, hernia, recurrence, pneumonia, heart attack, stroke, death) benefits and alternatives to surgery were discussed at length. We discussed that this procedure is being done to establish diagnosis and hopefully treat the underlying cause as outlined by Dr. Denman George. The patient's questions were answered to her satisfaction, she voiced understanding and elected to proceed with surgery. Additionally, we discussed typical postoperative expectations and the recovery process.  Signed electronically by Ileana Roup, MD (06/25/2019 10:19 AM)

## 2019-07-24 ENCOUNTER — Other Ambulatory Visit: Payer: Self-pay | Admitting: Internal Medicine

## 2019-07-24 MED ORDER — ALBUTEROL SULFATE HFA 108 (90 BASE) MCG/ACT IN AERS: 2.0000 | INHALATION_SPRAY | Freq: Four times a day (QID) | RESPIRATORY_TRACT | 5 refills | Status: DC | PRN

## 2019-07-24 MED ORDER — BUDESONIDE-FORMOTEROL FUMARATE 160-4.5 MCG/ACT IN AERO: INHALATION_SPRAY | RESPIRATORY_TRACT | 8 refills | Status: DC

## 2019-07-24 NOTE — Telephone Encounter (Signed)
Requested medication (s) are due for refill today: yes  Requested medication (s) are on the active medication list: yes  Last refill:  05/03/2019  Future visit scheduled:yes  Notes to clinic:  Review for refill   Requested Prescriptions  Pending Prescriptions Disp Refills   budesonide-formoterol (SYMBICORT) 160-4.5 MCG/ACT inhaler 6 g 8    Sig: INHALE 2 PUFFS INTO THE LUNGS 2 (TWO) TIMES DAILY.     There is no refill protocol information for this order     albuterol (VENTOLIN HFA) 108 (90 Base) MCG/ACT inhaler       Sig: Inhale into the lungs every 6 (six) hours as needed for wheezing or shortness of breath.     Pulmonology:  Beta Agonists Failed - 07/24/2019 11:28 AM      Failed - One inhaler should last at least one month. If the patient is requesting refills earlier, contact the patient to check for uncontrolled symptoms.      Passed - Valid encounter within last 12 months    Recent Outpatient Visits          4 months ago Encounter for well adult exam with abnormal findings   [facility] -Georges Mouse, MD   1 year ago [place], Dows, DO   1 year ago Chronic pain of right knee   [facility], Davis, DO   1 year ago Acute pain of right knee   [place], [place], DO   1 year ago Encounter for well adult exam with abnormal findings   [facility] -Georges Mouse, MD      Future Appointments            In 7 months Jenny Reichmann, Hunt Oris, MD [place], [facility]

## 2019-07-24 NOTE — Telephone Encounter (Signed)
Done erx 

## 2019-07-24 NOTE — Telephone Encounter (Signed)
budesonide-formoterol [facility]) 160-4.5 MCG/ACT inhaler  [facility] [address], [place] [phone] (Phone) [phone] (Fax)    albuterol (VENTOLIN HFA) 108 (90 Base) MCG/ACT inhaler  Please refill these (2) inhalers.

## 2019-08-09 ENCOUNTER — Encounter (HOSPITAL_COMMUNITY): Payer: Self-pay

## 2019-08-09 NOTE — Patient Instructions (Addendum)
DUE TO COVID-19 ONLY ONE VISITOR IS ALLOWED TO COME WITH YOU AND STAY IN THE WAITING ROOM ONLY DURING PRE OP AND PROCEDURE. THE ONE VISITOR MAY VISIT WITH YOU IN YOUR PRIVATE ROOM DURING VISITING HOURS ONLY!!   COVID SWAB TESTING MUST BE COMPLETED ON:  Monday, Oct. 12, 2020 at 9:15AM.     [address], [place] -[facility] enter pre surgical testing line (Must self quarantine after testing. Follow instructions on handout.)           Your procedure is scheduled on: Thursday, Oct. 15, 2020   Report to [facility] Main  Entrance   Report to Short Stay at 5:30 AM    Call this number if you have problems the morning of surgery [phone]   Wednesday, Oct. 14, 2020 eat a light diet   Increase oral fluid intake during your prep to avoid dehydration   Dulcolax:  Give with water the day prior to surgery.   Neomycin:  At 2 pm, 3 pm and 10 pm after Miralax bowel prep the day prior to surgery.   Metronidazole:  At 2 pm, 3 pm and 10 pm after Miralax bowel prep the day prior to surgery.   Miralax:  Mix with 64 oz Gatorade/Powerade. Drink gradually over the next few hours (8 oz glass every 15-30 minutes) until gone the day prior to surgery.    Drink 2 Ensure drinks the night before surgery by 10:00PM   Do not eat food :After Midnight.   May have liquids until 4:30AM day of surgery   CLEAR LIQUID DIET  Foods Allowed                                                                     Foods Excluded  Water, Black Coffee and tea, regular and decaf                             liquids that you cannot  Plain Jell-O in any flavor  (No red)                                           see through such as: Fruit ices (not with fruit pulp)                                     milk, soups, orange juice  Iced Popsicles (No red)                                    All solid food Carbonated beverages, regular and diet                                    Apple  juices Sports drinks like Gatorade (No red) Lightly seasoned clear broth or consume(fat free) Sugar, honey syrup  Sample Menu Breakfast  Lunch                                     Supper Cranberry juice                    Beef broth                            Chicken broth Jell-O                                     Grape juice                           Apple juice Coffee or tea                        Jell-O                                      Popsicle                                                Coffee or tea                        Coffee or tea     Complete one Ensure drink the morning of surgery at 4:30AM.   Brush your teeth the morning of surgery.   Do NOT smoke after Midnight   Take these medicines the morning of surgery with A SIP OF WATER: Synthroid, Montelukast, Xanax if needed   Bring Asthma Inhaler day of surgery                               You may not have any metal on your body including hair pins, jewelry, and body piercings             Do not wear make-up, lotions, powders, perfumes/cologne, or deodorant             Do not wear nail polish.  Do not shave  48 hours prior to surgery.                Do not bring valuables to the hospital. [place].   Contacts, dentures or bridgework may not be worn into surgery.   Bring small overnight bag day of surgery.   Special Instructions: Bring a copy of your healthcare power of attorney and living will documents         the day of surgery if you haven't scanned them in before.              Please read over the following fact sheets you were given:  [facility] - Preparing for Surgery Before surgery, you can play an important role.  Because skin is not sterile, your skin needs to be as free  of germs as possible.  You can reduce the number of germs on your skin by washing with CHG (chlorahexidine gluconate) soap before surgery.  CHG is an  antiseptic cleaner which kills germs and bonds with the skin to continue killing germs even after washing. Please DO NOT use if you have an allergy to CHG or antibacterial soaps.  If your skin becomes reddened/irritated stop using the CHG and inform your nurse when you arrive at Short Stay. Do not shave (including legs and underarms) for at least 48 hours prior to the first CHG shower.  You may shave your face/neck.  Please follow these instructions carefully:  1.  Shower with CHG Soap the night before surgery and the  morning of surgery.  2.  If you choose to wash your hair, wash your hair first as usual with your normal  shampoo.  3.  After you shampoo, rinse your hair and body thoroughly to remove the shampoo.                             4.  Use CHG as you would any other liquid soap.  You can apply chg directly to the skin and wash.  Gently with a scrungie or clean washcloth.  5.  Apply the CHG Soap to your body ONLY FROM THE NECK DOWN.   Do   not use on face/ open                           Wound or open sores. Avoid contact with eyes, ears mouth and   genitals (private parts).                       Wash face,  Genitals (private parts) with your normal soap.             6.  Wash thoroughly, paying special attention to the area where your    surgery  will be performed.  7.  Thoroughly rinse your body with warm water from the neck down.  8.  DO NOT shower/wash with your normal soap after using and rinsing off the CHG Soap.                9.  Pat yourself dry with a clean towel.            10.  Wear clean pajamas.            11.  Place clean sheets on your bed the night of your first shower and do not  sleep with pets. Day of Surgery : Do not apply any lotions/deodorants the morning of surgery.  Please wear clean clothes to the hospital/surgery center.  FAILURE TO FOLLOW THESE INSTRUCTIONS MAY RESULT IN THE CANCELLATION OF YOUR SURGERY  PATIENT  SIGNATURE_________________________________  NURSE SIGNATURE__________________________________  ________________________________________________________________________   Adam Phenix  An incentive spirometer is a tool that can help keep your lungs clear and active. This tool measures how well you are filling your lungs with each breath. Taking long deep breaths may help reverse or decrease the chance of developing breathing (pulmonary) problems (especially infection) following:  A long period of time when you are unable to move or be active. BEFORE THE PROCEDURE   If the spirometer includes an indicator to show your best effort, your nurse or respiratory therapist will set it to a desired goal.  If possible,  sit up straight or lean slightly forward. Try not to slouch.  Hold the incentive spirometer in an upright position. INSTRUCTIONS FOR USE  1. Sit on the edge of your bed if possible, or sit up as far as you can in bed or on a chair. 2. Hold the incentive spirometer in an upright position. 3. Breathe out normally. 4. Place the mouthpiece in your mouth and seal your lips tightly around it. 5. Breathe in slowly and as deeply as possible, raising the piston or the ball toward the top of the column. 6. Hold your breath for 3-5 seconds or for as long as possible. Allow the piston or ball to fall to the bottom of the column. 7. Remove the mouthpiece from your mouth and breathe out normally. 8. Rest for a few seconds and repeat Steps 1 through 7 at least 10 times every 1-2 hours when you are awake. Take your time and take a few normal breaths between deep breaths. 9. The spirometer may include an indicator to show your best effort. Use the indicator as a goal to work toward during each repetition. 10. After each set of 10 deep breaths, practice coughing to be sure your lungs are clear. If you have an incision (the cut made at the time of surgery), support your incision when coughing  by placing a pillow or rolled up towels firmly against it. Once you are able to get out of bed, walk around indoors and cough well. You may stop using the incentive spirometer when instructed by your caregiver.  RISKS AND COMPLICATIONS  Take your time so you do not get dizzy or light-headed.  If you are in pain, you may need to take or ask for pain medication before doing incentive spirometry. It is harder to take a deep breath if you are having pain. AFTER USE  Rest and breathe slowly and easily.  It can be helpful to keep track of a log of your progress. Your caregiver can provide you with a simple table to help with this. If you are using the spirometer at home, follow these instructions: [place] IF:   You are having difficultly using the spirometer.  You have trouble using the spirometer as often as instructed.  Your pain medication is not giving enough relief while using the spirometer.  You develop fever of 100.5 F (38.1 C) or higher. SEEK IMMEDIATE MEDICAL CARE IF:   You cough up bloody sputum that had not been present before.  You develop fever of 102 F (38.9 C) or greater.  You develop worsening pain at or near the incision site. MAKE SURE YOU:   Understand these instructions.  Will watch your condition.  Will get help right away if you are not doing well or get worse. Document Released: 02/28/2007 Document Revised: 01/10/2012 Document Reviewed: 05/01/2007 Exit[employer] Patient Information 2014 [employer], [place].   ________________________________________________________________________  WHAT IS A BLOOD TRANSFUSION? Blood Transfusion Information  A transfusion is the replacement of blood or some of its parts. Blood is made up of multiple cells which provide different functions.  Red blood cells carry oxygen and are used for blood loss replacement.  White blood cells fight against infection.  Platelets control bleeding.  Plasma helps clot  blood.  Other blood products are available for specialized needs, such as hemophilia or other clotting disorders. BEFORE THE TRANSFUSION  Who gives blood for transfusions?   Healthy volunteers who are fully evaluated to make sure their blood is safe. This  is blood bank blood. Transfusion therapy is the safest it has ever been in the practice of medicine. Before blood is taken from a donor, a complete history is taken to make sure that person has no history of diseases nor engages in risky social behavior (examples are intravenous drug use or sexual activity with multiple partners). The donor's travel history is screened to minimize risk of transmitting infections, such as malaria. The donated blood is tested for signs of infectious diseases, such as HIV and hepatitis. The blood is then tested to be sure it is compatible with you in order to minimize the chance of a transfusion reaction. If you or a relative donates blood, this is often done in anticipation of surgery and is not appropriate for emergency situations. It takes many days to process the donated blood. RISKS AND COMPLICATIONS Although transfusion therapy is very safe and saves many lives, the main dangers of transfusion include:   Getting an infectious disease.  Developing a transfusion reaction. This is an allergic reaction to something in the blood you were given. Every precaution is taken to prevent this. The decision to have a blood transfusion has been considered carefully by your caregiver before blood is given. Blood is not given unless the benefits outweigh the risks. AFTER THE TRANSFUSION  Right after receiving a blood transfusion, you will usually feel much better and more energetic. This is especially true if your red blood cells have gotten low (anemic). The transfusion raises the level of the red blood cells which carry oxygen, and this usually causes an energy increase.  The nurse administering the transfusion will monitor  you carefully for complications. HOME CARE INSTRUCTIONS  No special instructions are needed after a transfusion. You may find your energy is better. Speak with your caregiver about any limitations on activity for underlying diseases you may have. SEEK MEDICAL CARE IF:   Your condition is not improving after your transfusion.  You develop redness or irritation at the intravenous (IV) site. SEEK IMMEDIATE MEDICAL CARE IF:  Any of the following symptoms occur over the next 12 hours:  Shaking chills.  You have a temperature by mouth above 102 F (38.9 C), not controlled by medicine.  Chest, back, or muscle pain.  People around you feel you are not acting correctly or are confused.  Shortness of breath or difficulty breathing.  Dizziness and fainting.  You get a rash or develop hives.  You have a decrease in urine output.  Your urine turns a dark color or changes to pink, red, or brown. Any of the following symptoms occur over the next 10 days:  You have a temperature by mouth above 102 F (38.9 C), not controlled by medicine.  Shortness of breath.  Weakness after normal activity.  The white part of the eye turns yellow (jaundice).  You have a decrease in the amount of urine or are urinating less often.  Your urine turns a dark color or changes to pink, red, or brown. Document Released: 10/15/2000 Document Revised: 01/10/2012 Document Reviewed: 06/03/2008 [facility] Patient Information 2014 [place], [place].  _______________________________________________________________________

## 2019-08-10 ENCOUNTER — Other Ambulatory Visit: Payer: Self-pay

## 2019-08-10 ENCOUNTER — Encounter (HOSPITAL_COMMUNITY): Payer: Self-pay

## 2019-08-10 ENCOUNTER — Encounter (HOSPITAL_COMMUNITY)
Admission: RE | Admit: 2019-08-10 | Discharge: 2019-08-10 | Disposition: A | Discharge status: Home / Self Care | Payer: 59 | Source: Ambulatory Visit | Attending: Gynecologic Oncology | Admitting: Gynecologic Oncology

## 2019-08-10 DIAGNOSIS — Z01818 Encounter for other preprocedural examination: Secondary | ICD-10-CM | POA: Diagnosis present

## 2019-08-10 DIAGNOSIS — N809 Endometriosis, unspecified: Secondary | ICD-10-CM | POA: Insufficient documentation

## 2019-08-10 HISTORY — DX: Hemangioma of intra-abdominal structures: D18.03

## 2019-08-10 HISTORY — DX: Other noninflammatory disorders of ovary, fallopian tube and broad ligament: N83.8

## 2019-08-10 LAB — CBC WITH DIFFERENTIAL/PLATELET
Abs Immature Granulocytes: 0.05 10*3/uL (ref 0.00–0.07)
Basophils Absolute: 0.1 10*3/uL (ref 0.0–0.1)
Basophils Relative: 1 %
Eosinophils Absolute: 0.1 10*3/uL (ref 0.0–0.5)
Eosinophils Relative: 2 %
HCT: 40.4 % (ref 36.0–46.0)
Hemoglobin: 13.2 g/dL (ref 12.0–15.0)
Immature Granulocytes: 1 %
Lymphocytes Relative: 25 %
Lymphs Abs: 1.4 10*3/uL (ref 0.7–4.0)
MCH: 32.4 pg (ref 26.0–34.0)
MCHC: 32.7 g/dL (ref 30.0–36.0)
MCV: 99 fL (ref 80.0–100.0)
Monocytes Absolute: 0.5 10*3/uL (ref 0.1–1.0)
Monocytes Relative: 9 %
Neutro Abs: 3.5 10*3/uL (ref 1.7–7.7)
Neutrophils Relative %: 62 %
Platelets: 332 10*3/uL (ref 150–400)
RBC: 4.08 MIL/uL (ref 3.87–5.11)
RDW: 11.9 % (ref 11.5–15.5)
WBC: 5.6 10*3/uL (ref 4.0–10.5)
nRBC: 0 % (ref 0.0–0.2)

## 2019-08-10 LAB — URINALYSIS, ROUTINE W REFLEX MICROSCOPIC
Bilirubin Urine: NEGATIVE
Glucose, UA: NEGATIVE mg/dL
Hgb urine dipstick: NEGATIVE
Ketones, ur: NEGATIVE mg/dL
Leukocytes,Ua: NEGATIVE
Nitrite: NEGATIVE
Protein, ur: NEGATIVE mg/dL
Specific Gravity, Urine: 1.009 (ref 1.005–1.030)
pH: 6 (ref 5.0–8.0)

## 2019-08-10 LAB — PROTIME-INR
INR: 0.9 (ref 0.8–1.2)
Prothrombin Time: 12 seconds (ref 11.4–15.2)

## 2019-08-10 LAB — COMPREHENSIVE METABOLIC PANEL
ALT: 42 U/L (ref 0–44)
AST: 31 U/L (ref 15–41)
Albumin: 3.9 g/dL (ref 3.5–5.0)
Alkaline Phosphatase: 55 U/L (ref 38–126)
Anion gap: 9 (ref 5–15)
BUN: 12 mg/dL (ref 6–20)
CO2: 21 mmol/L — ABNORMAL LOW (ref 22–32)
Calcium: 9 mg/dL (ref 8.9–10.3)
Chloride: 106 mmol/L (ref 98–111)
Creatinine, Ser: 0.78 mg/dL (ref 0.44–1.00)
GFR calc Af Amer: >60 mL/min (ref >60)
GFR calc non Af Amer: >60 mL/min (ref >60)
Glucose, Bld: 95 mg/dL (ref 70–99)
Potassium: 3.9 mmol/L (ref 3.5–5.1)
Sodium: 136 mmol/L (ref 135–145)
Total Bilirubin: 0.4 mg/dL (ref 0.3–1.2)
Total Protein: 6.9 g/dL (ref 6.5–8.1)

## 2019-08-10 LAB — HEMOGLOBIN A1C
Hgb A1c MFr Bld: 5.2 % (ref 4.8–5.6)
Mean Plasma Glucose: 102.54 mg/dL

## 2019-08-10 LAB — PREGNANCY, URINE: Preg Test, Ur: NEGATIVE

## 2019-08-10 LAB — ABO/RH: ABO/RH(D): O POS

## 2019-08-10 LAB — APTT: aPTT: 27 seconds (ref 24–36)

## 2019-08-10 MED ORDER — NEOMYCIN SULFATE 500 MG PO TABS
1000.0000 mg | ORAL_TABLET | ORAL | Status: DC
  Filled 2019-08-10: qty 2

## 2019-08-10 MED ORDER — BISACODYL 5 MG PO TBEC
20.0000 mg | DELAYED_RELEASE_TABLET | Freq: Once | ORAL | Status: DC
  Filled 2019-08-10: qty 4

## 2019-08-10 MED ORDER — POLYETHYLENE GLYCOL 3350 17 GM/SCOOP PO POWD
1.0000 | Freq: Once | ORAL | Status: DC
  Filled 2019-08-10: qty 255

## 2019-08-10 MED ORDER — METRONIDAZOLE 500 MG PO TABS
1000.0000 mg | ORAL_TABLET | ORAL | Status: DC
  Filled 2019-08-10: qty 2

## 2019-08-10 NOTE — Progress Notes (Signed)
SPOKE W/  Seth Bake     SCREENING SYMPTOMS OF COVID 19:   COUGH--NO  RUNNY NOSE--- NO  SORE THROAT---NO  NASAL CONGESTION----NO  SNEEZING----NO  SHORTNESS OF BREATH---NO  DIFFICULTY BREATHING---NO  TEMP >100.0 -----NO  UNEXPLAINED BODY ACHES------NO  CHILLS -------- NO  HEADACHES ---------NO  LOSS OF SMELL/ TASTE --------NO    HAVE YOU OR ANY FAMILY MEMBER TRAVELLED PAST 14 DAYS OUT OF THE   COUNTY---Travelled to [place], [place], [place] STATE----Travelled to [place], [place], [place] COUNTRY----NO  HAVE YOU OR ANY FAMILY MEMBER BEEN EXPOSED TO ANYONE WITH COVID 19? NO

## 2019-08-10 NOTE — Progress Notes (Signed)
PCP - Dr. Cathlean Cower Cardiologist - N/A  Chest x-ray - N/A EKG - N/A Stress Test - N/A ECHO - N/A Cardiac Cath - N/A  Sleep Study - N/A CPAP - N/A  Fasting Blood Sugar - N/A Checks Blood Sugar __N/A___ times a day  Blood Thinner Instructions:  N/A Aspirin Instructions:  N/A Last Dose:  N/A  Anesthesia review:  N/A  Patient denies shortness of breath, fever, cough and chest pain at PAT appointment   Patient verbalized understanding of instructions that were given to them at the PAT appointment. Patient was also instructed that they will need to review over the PAT instructions again at home before surgery.

## 2019-08-13 ENCOUNTER — Other Ambulatory Visit (HOSPITAL_COMMUNITY)
Admission: RE | Admit: 2019-08-13 | Discharge: 2019-08-13 | Disposition: A | Discharge status: Home / Self Care | Payer: 59 | Source: Ambulatory Visit | Attending: Gynecologic Oncology | Admitting: Gynecologic Oncology

## 2019-08-13 LAB — SARS CORONAVIRUS 2 (TAT 6-24 HRS): SARS Coronavirus 2: NEGATIVE

## 2019-08-15 MED ORDER — BUPIVACAINE LIPOSOME 1.3 % IJ SUSP
20.0000 mL | Freq: Once | INTRAMUSCULAR | Status: DC
  Filled 2019-08-15: qty 20

## 2019-08-15 NOTE — Anesthesia Preprocedure Evaluation (Addendum)
Anesthesia Evaluation  Patient identified by MRN, date of birth, ID band Patient awake    Reviewed: Allergy & Precautions, NPO status , Patient's Chart, lab work & pertinent test results  History of Anesthesia Complications (+) PONV and history of anesthetic complications  Airway Mallampati: I  TM Distance: >3 FB Neck ROM: Full    Dental  (+) Teeth Intact, Dental Advisory Given   Pulmonary asthma ,    Pulmonary exam normal breath sounds clear to auscultation       Cardiovascular Exercise Tolerance: Good negative cardio ROS Normal cardiovascular exam Rhythm:Regular Rate:Normal     Neuro/Psych PSYCHIATRIC DISORDERS Anxiety Depression negative neurological ROS     GI/Hepatic negative GI ROS, Neg liver ROS,   Endo/Other  Hypothyroidism   Renal/GU negative Renal ROS     Musculoskeletal  (+) Arthritis ,   Abdominal   Peds  Hematology negative hematology ROS (+)   Anesthesia Other Findings Day of surgery medications reviewed with the patient.  Reproductive/Obstetrics BILATERAL OVARIAN MASSES, RECTAL MASS                            Anesthesia Physical Anesthesia Plan  ASA: II  Anesthesia Plan: General   Post-op Pain Management:    Induction: Intravenous  PONV Risk Score and Plan: 4 or greater and Scopolamine patch - Pre-op, Midazolam, Dexamethasone, Ondansetron, Propofol infusion and Diphenhydramine  Airway Management Planned: Oral ETT  Additional Equipment:   Intra-op Plan:   Post-operative Plan: Extubation in OR  Informed Consent: I have reviewed the patients History and Physical, chart, labs and discussed the procedure including the risks, benefits and alternatives for the proposed anesthesia with the patient or authorized representative who has indicated his/her understanding and acceptance.     Dental advisory given  Plan Discussed with: CRNA  Anesthesia Plan Comments:  (2nd PIV)       Anesthesia Quick Evaluation

## 2019-08-16 ENCOUNTER — Inpatient Hospital Stay (HOSPITAL_COMMUNITY): Payer: 59 | Admitting: Physician Assistant

## 2019-08-16 ENCOUNTER — Encounter (HOSPITAL_COMMUNITY)
Admission: RE | Disposition: A | Discharge status: Home / Self Care | Payer: Self-pay | Source: Other Acute Inpatient Hospital | Attending: Surgery

## 2019-08-16 ENCOUNTER — Other Ambulatory Visit: Payer: Self-pay

## 2019-08-16 ENCOUNTER — Inpatient Hospital Stay (HOSPITAL_COMMUNITY)
Admission: RE | Admit: 2019-08-16 | Discharge: 2019-08-18 | DRG: 743 | Disposition: A | Discharge status: Home / Self Care | Payer: 59 | Attending: Surgery | Admitting: Surgery

## 2019-08-16 ENCOUNTER — Inpatient Hospital Stay (HOSPITAL_COMMUNITY): Payer: 59 | Admitting: Certified Registered"

## 2019-08-16 ENCOUNTER — Encounter (HOSPITAL_COMMUNITY): Payer: Self-pay | Admitting: Emergency Medicine

## 2019-08-16 DIAGNOSIS — N809 Endometriosis, unspecified: Secondary | ICD-10-CM | POA: Diagnosis present

## 2019-08-16 DIAGNOSIS — Z20828 Contact with and (suspected) exposure to other viral communicable diseases: Secondary | ICD-10-CM | POA: Diagnosis present

## 2019-08-16 DIAGNOSIS — N800 Endometriosis of uterus: Secondary | ICD-10-CM | POA: Diagnosis present

## 2019-08-16 DIAGNOSIS — E785 Hyperlipidemia, unspecified: Secondary | ICD-10-CM | POA: Diagnosis present

## 2019-08-16 DIAGNOSIS — N801 Endometriosis of ovary: Secondary | ICD-10-CM | POA: Diagnosis present

## 2019-08-16 DIAGNOSIS — N838 Other noninflammatory disorders of ovary, fallopian tube and broad ligament: Secondary | ICD-10-CM | POA: Diagnosis present

## 2019-08-16 DIAGNOSIS — N80103 Endometriosis of bilateral ovaries, unspecified depth: Secondary | ICD-10-CM

## 2019-08-16 DIAGNOSIS — N803 Endometriosis of pelvic peritoneum: Secondary | ICD-10-CM | POA: Diagnosis not present

## 2019-08-16 DIAGNOSIS — Z79890 Hormone replacement therapy (postmenopausal): Secondary | ICD-10-CM | POA: Diagnosis not present

## 2019-08-16 DIAGNOSIS — N808 Other endometriosis: Secondary | ICD-10-CM

## 2019-08-16 DIAGNOSIS — Z79899 Other long term (current) drug therapy: Secondary | ICD-10-CM

## 2019-08-16 DIAGNOSIS — E039 Hypothyroidism, unspecified: Secondary | ICD-10-CM | POA: Diagnosis present

## 2019-08-16 DIAGNOSIS — N921 Excessive and frequent menstruation with irregular cycle: Secondary | ICD-10-CM | POA: Diagnosis present

## 2019-08-16 DIAGNOSIS — K6289 Other specified diseases of anus and rectum: Secondary | ICD-10-CM

## 2019-08-16 DIAGNOSIS — N135 Crossing vessel and stricture of ureter without hydronephrosis: Secondary | ICD-10-CM

## 2019-08-16 HISTORY — PX: XI ROBOTIC ASSISTED LOWER ANTERIOR RESECTION: SHX6558

## 2019-08-16 HISTORY — PX: ROBOTIC ASSISTED TOTAL HYSTERECTOMY WITH BILATERAL SALPINGO OOPHERECTOMY: SHX6086

## 2019-08-16 LAB — TYPE AND SCREEN
ABO/RH(D): O POS
Antibody Screen: NEGATIVE

## 2019-08-16 SURGERY — HYSTERECTOMY, TOTAL, ROBOT-ASSISTED, LAPAROSCOPIC, WITH BILATERAL SALPINGO-OOPHORECTOMY
Anesthesia: General

## 2019-08-16 MED ORDER — IBUPROFEN 200 MG PO TABS: 600.0000 mg | ORAL_TABLET | Freq: Four times a day (QID) | ORAL | Status: DC | PRN

## 2019-08-16 MED ORDER — ACETAMINOPHEN 500 MG PO TABS
1000.0000 mg | ORAL_TABLET | ORAL | Status: AC
  Administered 2019-08-16: 1000 mg via ORAL
  Filled 2019-08-16: qty 2

## 2019-08-16 MED ORDER — ONDANSETRON HCL 4 MG/2ML IJ SOLN
INTRAMUSCULAR | Status: AC
  Filled 2019-08-16: qty 2

## 2019-08-16 MED ORDER — HYDROMORPHONE HCL 1 MG/ML IJ SOLN: 0.5000 mg | INTRAMUSCULAR | Status: DC | PRN

## 2019-08-16 MED ORDER — SCOPOLAMINE 1 MG/3DAYS TD PT72
1.0000 | MEDICATED_PATCH | TRANSDERMAL | Status: DC
  Administered 2019-08-16: 1.5 mg via TRANSDERMAL
  Filled 2019-08-16: qty 1

## 2019-08-16 MED ORDER — ALBUTEROL SULFATE (2.5 MG/3ML) 0.083% IN NEBU: 2.5000 mg | INHALATION_SOLUTION | Freq: Four times a day (QID) | RESPIRATORY_TRACT | Status: DC | PRN

## 2019-08-16 MED ORDER — 0.9 % SODIUM CHLORIDE (POUR BTL) OPTIME
TOPICAL | Status: DC | PRN
  Administered 2019-08-16: 2000 mL

## 2019-08-16 MED ORDER — PROMETHAZINE HCL 25 MG/ML IJ SOLN: 6.2500 mg | INTRAMUSCULAR | Status: DC | PRN

## 2019-08-16 MED ORDER — ALPRAZOLAM 0.5 MG PO TABS
0.5000 mg | ORAL_TABLET | Freq: Every evening | ORAL | Status: DC | PRN
  Administered 2019-08-16: 0.5 mg via ORAL
  Filled 2019-08-16: qty 1

## 2019-08-16 MED ORDER — LACTATED RINGERS IR SOLN
Status: DC | PRN
  Administered 2019-08-16: 1

## 2019-08-16 MED ORDER — ROCURONIUM BROMIDE 10 MG/ML (PF) SYRINGE
PREFILLED_SYRINGE | INTRAVENOUS | Status: AC
  Filled 2019-08-16: qty 10

## 2019-08-16 MED ORDER — MIDAZOLAM HCL 2 MG/2ML IJ SOLN
INTRAMUSCULAR | Status: AC
  Filled 2019-08-16: qty 2

## 2019-08-16 MED ORDER — DIPHENHYDRAMINE HCL 50 MG/ML IJ SOLN
INTRAMUSCULAR | Status: DC | PRN
  Administered 2019-08-16: 12.5 mg via INTRAVENOUS

## 2019-08-16 MED ORDER — PHENYLEPHRINE 40 MCG/ML (10ML) SYRINGE FOR IV PUSH (FOR BLOOD PRESSURE SUPPORT)
PREFILLED_SYRINGE | INTRAVENOUS | Status: DC | PRN
  Administered 2019-08-16: 80 ug via INTRAVENOUS
  Administered 2019-08-16: 40 ug via INTRAVENOUS
  Administered 2019-08-16: 80 ug via INTRAVENOUS

## 2019-08-16 MED ORDER — LACTATED RINGERS IV SOLN
INTRAVENOUS | Status: DC | PRN
  Administered 2019-08-16: 08:00:00 via INTRAVENOUS

## 2019-08-16 MED ORDER — ALVIMOPAN 12 MG PO CAPS
12.0000 mg | ORAL_CAPSULE | Freq: Once | ORAL | Status: AC
  Administered 2019-08-16: 06:00:00 12 mg via ORAL
  Filled 2019-08-16: qty 1

## 2019-08-16 MED ORDER — INDOCYANINE GREEN 25 MG IV SOLR
INTRAVENOUS | Status: DC | PRN
  Administered 2019-08-16: 7.5 mg via INTRAVENOUS

## 2019-08-16 MED ORDER — LIDOCAINE 2% (20 MG/ML) 5 ML SYRINGE: INTRAMUSCULAR | Status: DC | PRN

## 2019-08-16 MED ORDER — HEPARIN SODIUM (PORCINE) 5000 UNIT/ML IJ SOLN
5000.0000 [IU] | Freq: Three times a day (TID) | INTRAMUSCULAR | Status: DC
  Administered 2019-08-16 – 2019-08-18 (×6): 5000 [IU] via SUBCUTANEOUS
  Filled 2019-08-16 (×6): qty 1

## 2019-08-16 MED ORDER — PHENYLEPHRINE HCL (PRESSORS) 10 MG/ML IV SOLN
INTRAVENOUS | Status: AC
  Filled 2019-08-16: qty 1

## 2019-08-16 MED ORDER — FENTANYL CITRATE (PF) 100 MCG/2ML IJ SOLN
25.0000 ug | INTRAMUSCULAR | Status: DC | PRN
  Administered 2019-08-16 (×2): 50 ug via INTRAVENOUS

## 2019-08-16 MED ORDER — CHLORHEXIDINE GLUCONATE CLOTH 2 % EX PADS: 6.0000 | MEDICATED_PAD | Freq: Once | CUTANEOUS | Status: DC

## 2019-08-16 MED ORDER — ACETAMINOPHEN 500 MG PO TABS
1000.0000 mg | ORAL_TABLET | Freq: Four times a day (QID) | ORAL | Status: DC
  Administered 2019-08-16 – 2019-08-18 (×8): 1000 mg via ORAL
  Filled 2019-08-16 (×8): qty 2

## 2019-08-16 MED ORDER — LIDOCAINE 2% (20 MG/ML) 5 ML SYRINGE
INTRAMUSCULAR | Status: DC | PRN
  Administered 2019-08-16: 1.5 mg/kg/h via INTRAVENOUS

## 2019-08-16 MED ORDER — ALVIMOPAN 12 MG PO CAPS: 12.0000 mg | ORAL_CAPSULE | ORAL | Status: DC

## 2019-08-16 MED ORDER — MONTELUKAST SODIUM 10 MG PO TABS
10.0000 mg | ORAL_TABLET | Freq: Every day | ORAL | Status: DC
  Administered 2019-08-16 – 2019-08-17 (×2): 10 mg via ORAL
  Filled 2019-08-16 (×2): qty 1

## 2019-08-16 MED ORDER — PROPOFOL 500 MG/50ML IV EMUL
INTRAVENOUS | Status: AC
  Filled 2019-08-16: qty 100

## 2019-08-16 MED ORDER — DEXAMETHASONE SODIUM PHOSPHATE 10 MG/ML IJ SOLN
INTRAMUSCULAR | Status: AC
  Filled 2019-08-16: qty 1

## 2019-08-16 MED ORDER — LACTATED RINGERS IV SOLN
INTRAVENOUS | Status: DC
  Administered 2019-08-16: 21:00:00 via INTRAVENOUS

## 2019-08-16 MED ORDER — ONDANSETRON HCL 4 MG/2ML IJ SOLN: 4.0000 mg | Freq: Four times a day (QID) | INTRAMUSCULAR | Status: DC | PRN

## 2019-08-16 MED ORDER — SODIUM CHLORIDE 0.9 % IV SOLN
2.0000 g | INTRAVENOUS | Status: AC
  Administered 2019-08-16: 2 g via INTRAVENOUS
  Filled 2019-08-16: qty 2

## 2019-08-16 MED ORDER — HEPARIN SODIUM (PORCINE) 5000 UNIT/ML IJ SOLN
5000.0000 [IU] | Freq: Once | INTRAMUSCULAR | Status: AC
  Administered 2019-08-16: 5000 [IU] via SUBCUTANEOUS
  Filled 2019-08-16: qty 1

## 2019-08-16 MED ORDER — LEVOTHYROXINE SODIUM 100 MCG PO TABS
200.0000 ug | ORAL_TABLET | Freq: Every day | ORAL | Status: DC
  Administered 2019-08-17 – 2019-08-18 (×2): 200 ug via ORAL
  Filled 2019-08-16 (×2): qty 2

## 2019-08-16 MED ORDER — KETAMINE HCL 10 MG/ML IJ SOLN
INTRAMUSCULAR | Status: DC | PRN
  Administered 2019-08-16 (×2): 20 mg via INTRAVENOUS

## 2019-08-16 MED ORDER — ACETAMINOPHEN 500 MG PO TABS: 1000.0000 mg | ORAL_TABLET | ORAL | Status: DC

## 2019-08-16 MED ORDER — EPHEDRINE SULFATE-NACL 50-0.9 MG/10ML-% IV SOSY
PREFILLED_SYRINGE | INTRAVENOUS | Status: DC | PRN
  Administered 2019-08-16 (×2): 5 mg via INTRAVENOUS

## 2019-08-16 MED ORDER — BUPIVACAINE HCL 0.25 % IJ SOLN
INTRAMUSCULAR | Status: DC | PRN
  Administered 2019-08-16: 30 mL

## 2019-08-16 MED ORDER — LACTATED RINGERS IV SOLN
INTRAVENOUS | Status: DC
  Administered 2019-08-16: 06:00:00 via INTRAVENOUS

## 2019-08-16 MED ORDER — DIPHENHYDRAMINE HCL 12.5 MG/5ML PO ELIX: 12.5000 mg | ORAL_SOLUTION | Freq: Four times a day (QID) | ORAL | Status: DC | PRN

## 2019-08-16 MED ORDER — ALUM & MAG HYDROXIDE-SIMETH 200-200-20 MG/5ML PO SUSP: 30.0000 mL | Freq: Four times a day (QID) | ORAL | Status: DC | PRN

## 2019-08-16 MED ORDER — TRAMADOL HCL 50 MG PO TABS: 50.0000 mg | ORAL_TABLET | Freq: Four times a day (QID) | ORAL | Status: DC | PRN

## 2019-08-16 MED ORDER — DEXAMETHASONE SODIUM PHOSPHATE 4 MG/ML IJ SOLN: 4.0000 mg | INTRAMUSCULAR | Status: DC

## 2019-08-16 MED ORDER — BUPIVACAINE LIPOSOME 1.3 % IJ SUSP
INTRAMUSCULAR | Status: DC | PRN
  Administered 2019-08-16: 20 mL

## 2019-08-16 MED ORDER — CHLORHEXIDINE GLUCONATE CLOTH 2 % EX PADS
6.0000 | MEDICATED_PAD | Freq: Every day | CUTANEOUS | Status: DC
  Administered 2019-08-16: 6 via TOPICAL

## 2019-08-16 MED ORDER — ALVIMOPAN 12 MG PO CAPS
12.0000 mg | ORAL_CAPSULE | Freq: Two times a day (BID) | ORAL | Status: DC
  Administered 2019-08-17 (×2): 12 mg via ORAL
  Filled 2019-08-16 (×2): qty 1

## 2019-08-16 MED ORDER — FENTANYL CITRATE (PF) 250 MCG/5ML IJ SOLN
INTRAMUSCULAR | Status: AC
  Filled 2019-08-16: qty 5

## 2019-08-16 MED ORDER — SODIUM CHLORIDE 0.9 % IV SOLN: 2.0000 g | INTRAVENOUS | Status: DC

## 2019-08-16 MED ORDER — FENTANYL CITRATE (PF) 100 MCG/2ML IJ SOLN
INTRAMUSCULAR | Status: AC
  Filled 2019-08-16: qty 2

## 2019-08-16 MED ORDER — DIPHENHYDRAMINE HCL 50 MG/ML IJ SOLN
INTRAMUSCULAR | Status: AC
  Filled 2019-08-16: qty 1

## 2019-08-16 MED ORDER — ONDANSETRON HCL 4 MG PO TABS: 4.0000 mg | ORAL_TABLET | Freq: Four times a day (QID) | ORAL | Status: DC | PRN

## 2019-08-16 MED ORDER — KETAMINE HCL 10 MG/ML IJ SOLN
INTRAMUSCULAR | Status: AC
  Filled 2019-08-16: qty 1

## 2019-08-16 MED ORDER — BUPIVACAINE HCL (PF) 0.25 % IJ SOLN
INTRAMUSCULAR | Status: AC
  Filled 2019-08-16: qty 30

## 2019-08-16 MED ORDER — PROPOFOL 500 MG/50ML IV EMUL
INTRAVENOUS | Status: DC | PRN
  Administered 2019-08-16: 25 ug/kg/min via INTRAVENOUS

## 2019-08-16 MED ORDER — PROPOFOL 10 MG/ML IV BOLUS
INTRAVENOUS | Status: DC | PRN
  Administered 2019-08-16: 140 mg via INTRAVENOUS
  Administered 2019-08-16: 20 mg via INTRAVENOUS

## 2019-08-16 MED ORDER — FENTANYL CITRATE (PF) 250 MCG/5ML IJ SOLN
INTRAMUSCULAR | Status: DC | PRN
  Administered 2019-08-16 (×2): 25 ug via INTRAVENOUS
  Administered 2019-08-16: 100 ug via INTRAVENOUS
  Administered 2019-08-16: 50 ug via INTRAVENOUS

## 2019-08-16 MED ORDER — DEXAMETHASONE SODIUM PHOSPHATE 10 MG/ML IJ SOLN
INTRAMUSCULAR | Status: DC | PRN
  Administered 2019-08-16: 8 mg via INTRAVENOUS

## 2019-08-16 MED ORDER — ONDANSETRON HCL 4 MG/2ML IJ SOLN
INTRAMUSCULAR | Status: DC | PRN
  Administered 2019-08-16: 4 mg via INTRAVENOUS

## 2019-08-16 MED ORDER — SUGAMMADEX SODIUM 200 MG/2ML IV SOLN
INTRAVENOUS | Status: DC | PRN
  Administered 2019-08-16: 180 mg via INTRAVENOUS

## 2019-08-16 MED ORDER — CELECOXIB 200 MG PO CAPS
400.0000 mg | ORAL_CAPSULE | ORAL | Status: AC
  Administered 2019-08-16: 400 mg via ORAL
  Filled 2019-08-16: qty 2

## 2019-08-16 MED ORDER — PROPOFOL 10 MG/ML IV BOLUS
INTRAVENOUS | Status: AC
  Filled 2019-08-16: qty 20

## 2019-08-16 MED ORDER — DIPHENHYDRAMINE HCL 50 MG/ML IJ SOLN: 12.5000 mg | Freq: Four times a day (QID) | INTRAMUSCULAR | Status: DC | PRN

## 2019-08-16 MED ORDER — ENOXAPARIN SODIUM 40 MG/0.4ML ~~LOC~~ SOLN: 40.0000 mg | SUBCUTANEOUS | Status: DC

## 2019-08-16 MED ORDER — ROCURONIUM BROMIDE 10 MG/ML (PF) SYRINGE
PREFILLED_SYRINGE | INTRAVENOUS | Status: DC | PRN
  Administered 2019-08-16 (×5): 20 mg via INTRAVENOUS
  Administered 2019-08-16 (×2): 10 mg via INTRAVENOUS
  Administered 2019-08-16 (×2): 20 mg via INTRAVENOUS
  Administered 2019-08-16: 50 mg via INTRAVENOUS
  Administered 2019-08-16: 20 mg via INTRAVENOUS
  Administered 2019-08-16 (×2): 10 mg via INTRAVENOUS

## 2019-08-16 MED ORDER — GABAPENTIN 300 MG PO CAPS
300.0000 mg | ORAL_CAPSULE | ORAL | Status: AC
  Administered 2019-08-16: 300 mg via ORAL
  Filled 2019-08-16: qty 1

## 2019-08-16 MED ORDER — ENSURE SURGERY PO LIQD
237.0000 mL | Freq: Two times a day (BID) | ORAL | Status: DC
  Administered 2019-08-16 – 2019-08-18 (×4): 237 mL via ORAL
  Filled 2019-08-16 (×5): qty 237

## 2019-08-16 MED ORDER — MIDAZOLAM HCL 2 MG/2ML IJ SOLN
INTRAMUSCULAR | Status: DC | PRN
  Administered 2019-08-16: 2 mg via INTRAVENOUS

## 2019-08-16 MED ORDER — LIDOCAINE 2% (20 MG/ML) 5 ML SYRINGE
INTRAMUSCULAR | Status: DC | PRN
  Administered 2019-08-16: 80 mg via INTRAVENOUS

## 2019-08-16 MED ORDER — LIDOCAINE HCL 2 % IJ SOLN
INTRAMUSCULAR | Status: AC
  Filled 2019-08-16: qty 20

## 2019-08-16 MED ORDER — LIDOCAINE 2% (20 MG/ML) 5 ML SYRINGE
INTRAMUSCULAR | Status: AC
  Filled 2019-08-16: qty 5

## 2019-08-16 SURGICAL SUPPLY — 155 items
ADH SKN CLS APL DERMABOND .7 (GAUZE/BANDAGES/DRESSINGS) ×1
AGENT HMST KT MTR STRL THRMB (HEMOSTASIS)
APL ESCP 34 STRL LF DISP (HEMOSTASIS)
APL PRP STRL LF DISP 70% ISPRP (MISCELLANEOUS) ×1
APPLICATOR SURGIFLO ENDO (HEMOSTASIS) IMPLANT
APPLIER CLIP 5 13 M/L LIGAMAX5 (MISCELLANEOUS)
APPLIER CLIP ROT 10 11.4 M/L (STAPLE)
APR CLP MED LRG 11.4X10 (STAPLE)
APR CLP MED LRG 5 ANG JAW (MISCELLANEOUS)
BAG LAPAROSCOPIC 12 15 PORT 16 (BASKET) IMPLANT
BAG RETRIEVAL 12/15 (BASKET)
BAG RETRIEVAL 12/15MM (BASKET)
BAG SPEC RTRVL LRG 6X4 10 (ENDOMECHANICALS)
BLADE EXTENDED COATED 6.5IN (ELECTRODE) ×3 IMPLANT
BLADE SURG SZ10 CARB STEEL (BLADE) IMPLANT
CANNULA REDUC XI 12-8 STAPL (CANNULA) ×1
CANNULA REDUC XI 12-8MM STAPL (CANNULA) ×1
CANNULA REDUCER 12-8 DVNC XI (CANNULA) ×1 IMPLANT
CELLS DAT CNTRL 66122 CELL SVR (MISCELLANEOUS) IMPLANT
CHLORAPREP W/TINT 26 (MISCELLANEOUS) ×3 IMPLANT
CLIP APPLIE 5 13 M/L LIGAMAX5 (MISCELLANEOUS) IMPLANT
CLIP APPLIE ROT 10 11.4 M/L (STAPLE) IMPLANT
CLIP VESOLOCK LG 6/CT PURPLE (CLIP) IMPLANT
CLIP VESOLOCK MED LG 6/CT (CLIP) IMPLANT
COVER BACK TABLE 60X90IN (DRAPES) ×3 IMPLANT
COVER SURGICAL LIGHT HANDLE (MISCELLANEOUS) ×6 IMPLANT
COVER TIP SHEARS 8 DVNC (MISCELLANEOUS) ×2 IMPLANT
COVER TIP SHEARS 8MM DA VINCI (MISCELLANEOUS) ×4
COVER WAND RF STERILE (DRAPES) IMPLANT
DECANTER SPIKE VIAL GLASS SM (MISCELLANEOUS) ×3 IMPLANT
DERMABOND ADVANCED (GAUZE/BANDAGES/DRESSINGS) ×2
DERMABOND ADVANCED .7 DNX12 (GAUZE/BANDAGES/DRESSINGS) ×1 IMPLANT
DEVICE TROCAR PUNCTURE CLOSURE (ENDOMECHANICALS) IMPLANT
DRAIN CHANNEL 19F RND (DRAIN) ×3 IMPLANT
DRAPE ARM DVNC X/XI (DISPOSABLE) ×8 IMPLANT
DRAPE COLUMN DVNC XI (DISPOSABLE) ×2 IMPLANT
DRAPE DA VINCI XI ARM (DISPOSABLE) ×16
DRAPE DA VINCI XI COLUMN (DISPOSABLE) ×4
DRAPE SHEET LG 3/4 BI-LAMINATE (DRAPES) ×3 IMPLANT
DRAPE SURG IRRIG POUCH 19X23 (DRAPES) ×6 IMPLANT
DRSG OPSITE POSTOP 4X10 (GAUZE/BANDAGES/DRESSINGS) IMPLANT
DRSG OPSITE POSTOP 4X6 (GAUZE/BANDAGES/DRESSINGS) ×2 IMPLANT
DRSG OPSITE POSTOP 4X8 (GAUZE/BANDAGES/DRESSINGS) IMPLANT
DRSG TEGADERM 2-3/8X2-3/4 SM (GAUZE/BANDAGES/DRESSINGS) ×15 IMPLANT
DRSG TEGADERM 4X4.75 (GAUZE/BANDAGES/DRESSINGS) ×3 IMPLANT
ELECT PENCIL ROCKER SW 15FT (MISCELLANEOUS) ×3 IMPLANT
ELECT REM PT RETURN 15FT ADLT (MISCELLANEOUS) ×6 IMPLANT
ENDOLOOP SUT PDS II  0 18 (SUTURE)
ENDOLOOP SUT PDS II 0 18 (SUTURE) IMPLANT
EVACUATOR SILICONE 100CC (DRAIN) ×3 IMPLANT
GAUZE SPONGE 2X2 8PLY STRL LF (GAUZE/BANDAGES/DRESSINGS) ×1 IMPLANT
GAUZE SPONGE 4X4 12PLY STRL (GAUZE/BANDAGES/DRESSINGS) IMPLANT
GLOVE BIO SURGEON STRL SZ 6 (GLOVE) ×12 IMPLANT
GLOVE BIO SURGEON STRL SZ 6.5 (GLOVE) IMPLANT
GLOVE BIO SURGEON STRL SZ7.5 (GLOVE) ×9 IMPLANT
GLOVE BIO SURGEONS STRL SZ 6.5 (GLOVE)
GLOVE INDICATOR 8.0 STRL GRN (GLOVE) ×9 IMPLANT
GOWN STRL REUS W/ TWL LRG LVL3 (GOWN DISPOSABLE) ×4 IMPLANT
GOWN STRL REUS W/TWL LRG LVL3 (GOWN DISPOSABLE) ×12
GOWN STRL REUS W/TWL XL LVL3 (GOWN DISPOSABLE) ×15 IMPLANT
GRASPER SUT TROCAR 14GX15 (MISCELLANEOUS) IMPLANT
HOLDER FOLEY CATH W/STRAP (MISCELLANEOUS) ×6 IMPLANT
IRRIG SUCT STRYKERFLOW 2 WTIP (MISCELLANEOUS) ×3
IRRIGATION SUCT STRKRFLW 2 WTP (MISCELLANEOUS) ×1 IMPLANT
KIT PROCEDURE DA VINCI SI (MISCELLANEOUS)
KIT PROCEDURE DVNC SI (MISCELLANEOUS) IMPLANT
KIT TURNOVER KIT A (KITS) IMPLANT
MANIPULATOR UTERINE 4.5 ZUMI (MISCELLANEOUS) ×3 IMPLANT
NDL INSUFFLATION 14GA 120MM (NEEDLE) ×1 IMPLANT
NDL SPNL 18GX3.5 QUINCKE PK (NEEDLE) IMPLANT
NEEDLE HYPO 22GX1.5 SAFETY (NEEDLE) IMPLANT
NEEDLE INSUFFLATION 14GA 120MM (NEEDLE) ×3 IMPLANT
NEEDLE SPNL 18GX3.5 QUINCKE PK (NEEDLE) IMPLANT
OBTURATOR OPTICAL STANDARD 8MM (TROCAR) ×2
OBTURATOR OPTICAL STND 8 DVNC (TROCAR) ×1
OBTURATOR OPTICALSTD 8 DVNC (TROCAR) ×1 IMPLANT
PACK CARDIOVASCULAR III (CUSTOM PROCEDURE TRAY) ×3 IMPLANT
PACK COLON (CUSTOM PROCEDURE TRAY) ×3 IMPLANT
PACK ROBOT GYN CUSTOM WL (TRAY / TRAY PROCEDURE) ×3 IMPLANT
PAD POSITIONING PINK XL (MISCELLANEOUS) ×6 IMPLANT
PORT ACCESS TROCAR AIRSEAL 12 (TROCAR) ×1 IMPLANT
PORT ACCESS TROCAR AIRSEAL 5M (TROCAR) ×2
PORT LAP GEL ALEXIS MED 5-9CM (MISCELLANEOUS) ×3 IMPLANT
POUCH SPECIMEN RETRIEVAL 10MM (ENDOMECHANICALS) IMPLANT
PROTECTOR NERVE ULNAR (MISCELLANEOUS) ×6 IMPLANT
RELOAD STAPLE 45 BLU REG DVNC (STAPLE) IMPLANT
RELOAD STAPLE 45 GRN THCK DVNC (STAPLE) IMPLANT
RELOAD STAPLE 60 4.3 GRN DVNC (STAPLE) IMPLANT
RELOAD STAPLER 4.3X60 GRN DVNC (STAPLE) ×2 IMPLANT
RETRACTOR WND ALEXIS 18 MED (MISCELLANEOUS) IMPLANT
RTRCTR WOUND ALEXIS 18CM MED (MISCELLANEOUS)
SCISSORS LAP 5X35 DISP (ENDOMECHANICALS) IMPLANT
SEAL CANN UNIV 5-8 DVNC XI (MISCELLANEOUS) ×7 IMPLANT
SEAL XI 5MM-8MM UNIVERSAL (MISCELLANEOUS) ×14
SEALER VESSEL DA VINCI XI (MISCELLANEOUS) ×2
SEALER VESSEL EXT DVNC XI (MISCELLANEOUS) ×1 IMPLANT
SET IRRIG TUBING LAPAROSCOPIC (IRRIGATION / IRRIGATOR) ×3 IMPLANT
SET TRI-LUMEN FLTR TB AIRSEAL (TUBING) ×3 IMPLANT
SLEEVE ADV FIXATION 5X100MM (TROCAR) ×4 IMPLANT
SOLUTION ELECTROLUBE (MISCELLANEOUS) ×3 IMPLANT
SPONGE GAUZE 2X2 STER 10/PKG (GAUZE/BANDAGES/DRESSINGS) ×2
SPONGE LAP 18X18 X RAY DECT (DISPOSABLE) IMPLANT
STAPLER 45 BLU RELOAD XI (STAPLE) IMPLANT
STAPLER 45 BLUE RELOAD XI (STAPLE)
STAPLER 45 GREEN RELOAD XI (STAPLE)
STAPLER 45 GRN RELOAD XI (STAPLE) IMPLANT
STAPLER 60 DA VINCI SURE FORM (STAPLE) ×2
STAPLER 60 SUREFORM DVNC (STAPLE) IMPLANT
STAPLER CANNULA SEAL DVNC XI (STAPLE) ×1 IMPLANT
STAPLER CANNULA SEAL XI (STAPLE) ×2
STAPLER ECHELON POWER CIR 31 (STAPLE) ×2 IMPLANT
STAPLER RELOAD 4.3X60 GREEN (STAPLE) ×4
STAPLER RELOAD 4.3X60 GRN DVNC (STAPLE) ×2
STAPLER SHEATH (SHEATH) ×2
STAPLER SHEATH ENDOWRIST DVNC (SHEATH) ×1 IMPLANT
SURGIFLO W/THROMBIN 8M KIT (HEMOSTASIS) IMPLANT
SURGILUBE 2OZ TUBE FLIPTOP (MISCELLANEOUS) ×3 IMPLANT
SUT MNCRL AB 4-0 PS2 18 (SUTURE) ×5 IMPLANT
SUT PDS AB 1 CT1 27 (SUTURE) ×4 IMPLANT
SUT PDS AB 1 TP1 96 (SUTURE) IMPLANT
SUT PROLENE 0 CT 2 (SUTURE) IMPLANT
SUT PROLENE 2 0 KS (SUTURE) ×5 IMPLANT
SUT PROLENE 2 0 SH DA (SUTURE) IMPLANT
SUT SILK 2 0 (SUTURE) ×3
SUT SILK 2 0 SH CR/8 (SUTURE) IMPLANT
SUT SILK 2-0 18XBRD TIE 12 (SUTURE) IMPLANT
SUT SILK 3 0 (SUTURE) ×3
SUT SILK 3 0 SH CR/8 (SUTURE) ×3 IMPLANT
SUT SILK 3-0 18XBRD TIE 12 (SUTURE) ×1 IMPLANT
SUT V-LOC BARB 180 2/0GR6 GS22 (SUTURE)
SUT VIC AB 0 CT1 27 (SUTURE) ×9
SUT VIC AB 0 CT1 27XBRD ANTBC (SUTURE) IMPLANT
SUT VIC AB 2-0 CT1 27 (SUTURE)
SUT VIC AB 2-0 CT1 TAPERPNT 27 (SUTURE) IMPLANT
SUT VIC AB 3-0 SH 18 (SUTURE) IMPLANT
SUT VIC AB 3-0 SH 27 (SUTURE)
SUT VIC AB 3-0 SH 27XBRD (SUTURE) IMPLANT
SUT VIC AB 4-0 PS2 18 (SUTURE) ×6 IMPLANT
SUT VICRYL 0 UR6 27IN ABS (SUTURE) ×5 IMPLANT
SUTURE V-LC BRB 180 2/0GR6GS22 (SUTURE) IMPLANT
SYR 10ML LL (SYRINGE) ×3 IMPLANT
SYS LAPSCP GELPORT 120MM (MISCELLANEOUS)
SYSTEM LAPSCP GELPORT 120MM (MISCELLANEOUS) IMPLANT
TAPE UMBILICAL COTTON 1/8X30 (MISCELLANEOUS) ×3 IMPLANT
TOWEL OR NON WOVEN STRL DISP B (DISPOSABLE) ×6 IMPLANT
TRAP SPECIMEN MUCOUS 40CC (MISCELLANEOUS) IMPLANT
TRAY FOLEY MTR SLVR 16FR STAT (SET/KITS/TRAYS/PACK) ×6 IMPLANT
TROCAR ADV FIXATION 5X100MM (TROCAR) ×3 IMPLANT
TROCAR XCEL NON-BLD 5MMX100MML (ENDOMECHANICALS) IMPLANT
TUBING CONNECTING 10 (TUBING) ×4 IMPLANT
TUBING CONNECTING 10' (TUBING) ×2
TUBING INSUFFLATION 10FT LAP (TUBING) ×3 IMPLANT
UNDERPAD 30X36 HEAVY ABSORB (UNDERPADS AND DIAPERS) ×3 IMPLANT
WATER STERILE IRR 1000ML POUR (IV SOLUTION) ×3 IMPLANT
YANKAUER SUCT BULB TIP 10FT TU (MISCELLANEOUS) IMPLANT

## 2019-08-16 NOTE — H&P (Signed)
H&P Note: Gyn-Onc  Consult was requested by Dr. Ronita Hipps and Dr Dema Severin for the evaluation of Stephanie Schwartz 53 y.o. female  CC:  Bilateral ovarian masses, rectal mass, menorrhagia, elevated CA 125  Assessment/Plan:  Stephanie. Stephanie Schwartz  is a 17 y.o.  year old with bilateral complex adnexal masses, and a rectal mass, and menorrhagia. There is concern for stage IV endometriosis and a surgery is recommended for both diagnostic and therapeutic purposes. I am recommending a robotic assisted total hysterectomy, BSO, and possible low anterior resection versus anterior discectomy of the anterior rectal wall. Dr Dema Severin will assist with this procedure. We will have a Urologist place either localizing stents vs ICG tracer within the ureters.   I discussed the anticipated procedure and the anticipated risks which are higher when there is adhesive disease with endometriosis causing fusion of pelvic organs. I explained the risk of damage to bowel and urologic structures. I explained that Dr Dema Severin may need to perform a rectal resection and if so, there is a possibility for a temporary diverting stoma.  I explained that deep surgical site infection is a high risk with hysterectomy and colectomy.   HPI: Stephanie Schwartz is a 89 year old P1 who is seen in consultation at the request of Dr Ronita Hipps and Dr Dema Severin for bilateral adnexal masses and a rectal mass.  The patient was undergoing routine colonoscopic screening and a sessile, partially obstructing mass was found in the rectosigmoid colon about 18 cm from the anal verge, just above the most proximal rectal valve fold.  Its location and morphology made biopsying it challenging.  However biopsies were taken from this mass.  Pathology revealed an inflammatory polyp with no malignancy seen.  A CT scan of the abdomen and pelvis was then performed on May 25, 2019.  It revealed a 5.1 cm low-attenuation mass in the anterior right hepatic lobe that had been stable from  prior 2016 scan consistent with a benign lesion.  In the pelvis the uterus was unremarkable.  There was a complex cystic lesion containing several thin internal septation seen in the left adnexa measuring 5.3 x 4 cm.  It was new from 2016.  A benign-appearing cystic lesion was seen in the right posterior adnexa measuring 4.6 x 3.6 cm and is also new.  There is no ascites.  In the colon a focal soft tissue density was seen along the right lateral colonic wall near the rectosigmoid junction measuring 2.4 x 1.8 cm.  It was suspicious for a colon carcinoma.  There is no evidence of bowel obstruction.  There was no carcinomatosis, no lymphadenopathy.  The patient was then seen by her OB/GYN, Dr. Ronita Hipps, on June 04, 2019.  This revealed a right ovarian complex cyst with diffuse homogeneous internal echoes measuring 4.6 x 3.6 x 3.2 cm with no increased blood flow.  The multiple left ovarian complex cyst with low internal echoes noted measuring 3.5 x 3.3 x 3 cm.  The uterus was grossly normal and measured 10.4 x 6.3 x 4.5 cm.  The cysts were felt to be most consistent ultrasound imaging with an endometrioma.  A Ca1 25 was drawn that same day which was elevated at 57.  The patient reports that she is been beginning to have irregular menses in the year of 2019 and 2020.  When she does have menstrual cycles they are heavier than usual.  She is a lifelong history of intermittent diarrhea that she does not feel is worse  than usual.  She denies bloody stools.  She denies that her GI symptoms change at the time of her cycle.  She denies significant cyclical pelvic pain.  She is never been diagnosed with endometriosis.  She has a history of one prior vaginal delivery 16 years ago.  She has a history of a laparoscopic appendectomy with Dr. Rosendo Gros in 2016.  Current Meds:  Outpatient Encounter Medications as of 06/13/2019  Medication Sig  . albuterol (VENTOLIN HFA) 108 (90 Base) MCG/ACT inhaler Inhale into the lungs  every 6 (six) hours as needed for wheezing or shortness of breath.  . ALPRAZolam (XANAX) 0.5 MG tablet TAKE ONE TABLET BY MOUTH TWICE A DAY AS NEEDED ANXIETY  . budesonide-formoterol (SYMBICORT) 160-4.5 MCG/ACT inhaler INHALE 2 PUFFS INTO THE LUNGS 2 (TWO) TIMES DAILY.  Marland Kitchen escitalopram (LEXAPRO) 10 MG tablet Take 1 tablet (10 mg total) by mouth daily. Then stop  . levothyroxine (SYNTHROID) 200 MCG tablet levothyroxine 200 mcg tablet  TAKE 1 TABLET PO QD  . montelukast (SINGULAIR) 10 MG tablet TAKE ONE TABLET BY MOUTH DAILY  . [DISCONTINUED] ibuprofen (ADVIL,MOTRIN) 200 MG tablet Take 200 mg by mouth every 6 (six) hours as needed for mild pain or moderate pain.  Marland Kitchen ibuprofen (ADVIL) 800 MG tablet Take 1 tablet (800 mg total) by mouth every 8 (eight) hours as needed for moderate pain. For AFTER surgery only  . oxyCODONE (OXY IR/ROXICODONE) 5 MG immediate release tablet Take 1 tablet (5 mg total) by mouth every 4 (four) hours as needed for severe pain. For AFTER surgery, do not take and drive  . [DISCONTINUED] Diclofenac Sodium 2 % SOLN Place 2 g onto the skin 2 (two) times daily. (Patient not taking: Reported on 05/03/2019)  . [DISCONTINUED] Vitamin D, Ergocalciferol, (DRISDOL) 50000 units CAPS capsule TAKE 1 CAPSULE BY MOUTH ONCE A WEEK (Patient not taking: Reported on 05/03/2019)  . [DISCONTINUED] zaleplon (SONATA) 5 MG capsule TAKE ONE CAPSULE BY MOUTH EVERY NIGHT AT BEDTIME AS NEEDED FOR SLEEP (Patient not taking: Reported on 05/03/2019)   No facility-administered encounter medications on file as of 06/13/2019.     Allergy: No Known Allergies  Social Hx:   Social History   Socioeconomic History  . Marital status: Divorced    Spouse name: Not on file  . Number of children: 1  . Years of education: Not on file  . Highest education level: Not on file  Occupational History  . Not on file  Social Needs  . Financial resource strain: Not on file  . Food insecurity    Worry: Not on file     Inability: Not on file  . Transportation needs    Medical: Not on file    Non-medical: Not on file  Tobacco Use  . Smoking status: Never Smoker  . Smokeless tobacco: Never Used  Substance and Sexual Activity  . Alcohol use: Yes    Alcohol/week: 14.0 standard drinks    Types: 14 Glasses of wine per week  . Drug use: No  . Sexual activity: Yes    Birth control/protection: None    Comment: Lesbian  Lifestyle  . Physical activity    Days per week: Not on file    Minutes per session: Not on file  . Stress: Not on file  Relationships  . Social Herbalist on phone: Not on file    Gets together: Not on file    Attends religious service: Not on file    Active  member of club or organization: Not on file    Attends meetings of clubs or organizations: Not on file    Relationship status: Not on file  . Intimate partner violence    Fear of current or ex partner: Not on file    Emotionally abused: Not on file    Physically abused: Not on file    Forced sexual activity: Not on file  Other Topics Concern  . Not on file  Social History Narrative  . Not on file    Past Surgical Hx:  Past Surgical History:  Procedure Laterality Date  . COLONOSCOPY  05/2019  . cyst removed from neck area     age 72  . LAPAROSCOPIC APPENDECTOMY N/A 12/02/2014   Procedure: APPENDECTOMY LAPAROSCOPIC;  Surgeon: Ralene Ok, MD;  Location: [facility];  Service: General;  Laterality: N/A;  . REDUCTION MAMMAPLASTY Bilateral ~ 1995  . SHOULDER ARTHROSCOPY Left ~ 2009  . WISDOM TOOTH EXTRACTION     with sedation    Past Medical Hx:  Past Medical History:  Diagnosis Date  . ALLERGIC RHINITIS 03/01/2008  . Allergy   . Anxiety 02/25/2012  . Arthritis    left thumb  . ASTHMA    "allergy induced"  . Asthma    seasonal  . ASTHMATIC BRONCHITIS, ACUTE 10/14/2008  . Heart murmur    "maybe" (12/02/2014)  . Hemangioma of liver   . HYPERCHOLESTEROLEMIA 08/21/2007  . HYPOTHYROIDISM   . OTITIS MEDIA,  SEROUS, ACUTE, LEFT 03/15/2008  . Ovarian mass   . PONV (postoperative nausea and vomiting)   . SHINGLES 06/07/2008  . Wheezing 10/14/2009    Past Gynecological History:  SVD x 1 Patient's last menstrual period was 07/30/2019 (exact date).  Family Hx:  Family History  Problem Relation Age of Onset  . Alcohol abuse Other        ETOH  . Cancer Other        Breast Cancer  . Hypertension Other   . Hyperlipidemia Other   . Diabetes Other   . Breast cancer Maternal Aunt   . Colon polyps Father   . Colon cancer Neg Hx   . Esophageal cancer Neg Hx   . Rectal cancer Neg Hx   . Stomach cancer Neg Hx     Review of Systems:  Constitutional  Feels well,    ENT Normal appearing ears and nares bilaterally Skin/Breast  No rash, sores, jaundice, itching, dryness Cardiovascular  No chest pain, shortness of breath, or edema  Pulmonary  No cough or wheeze.  Gastro Intestinal  No nausea, vomitting, or diarrhoea. No bright red blood per rectum, no abdominal pain, change in bowel movement, or constipation.  Genito Urinary  No frequency, urgency, dysuria, + heavy menstrual bleeding Musculo Skeletal  No myalgia, arthralgia, joint swelling or pain  Neurologic  No weakness, numbness, change in gait,  Psychology  No depression, anxiety, insomnia.   Vitals:  Blood pressure 117/77, pulse 67, temperature 97.8 F (36.6 C), temperature source Oral, resp. rate 16, height 5\' 9"  (1.753 m), weight 190 lb 6 oz (86.4 kg), last menstrual period 07/30/2019, SpO2 99 %.  Physical Exam: WD in NAD Neck  Supple NROM, without any enlargements.  Lymph Node Survey No cervical supraclavicular or inguinal adenopathy Cardiovascular  Pulse normal rate, regularity and rhythm. S1 and S2 normal.  Lungs  Clear to auscultation bilateraly, without wheezes/crackles/rhonchi. Good air movement.  Skin  No rash/lesions/breakdown  Psychiatry  Alert and oriented to person, place, and  time  Abdomen  Normoactive bowel  sounds, abdomen soft, non-tender and nonobese without evidence of hernia.  Back No CVA tenderness Genito Urinary  Vulva/vagina: Normal external female genitalia.  No lesions. No discharge or bleeding.  Bladder/urethra:  No lesions or masses, well supported bladder  Vagina: normal  Cervix: Normal appearing, no lesions.  Uterus: Deviated to the right Small, mobile, no parametrial involvement or nodularity.  Adnexa: no discretely palpable masses. Rectal  Good tone, no masses no cul de sac nodularity.  Extremities  No bilateral cyanosis, clubbing or edema.   Thereasa Solo, MD  08/16/2019, 7:11 AM

## 2019-08-16 NOTE — Anesthesia Postprocedure Evaluation (Signed)
Anesthesia Post Note  Patient: AARIAN BURFEIND  Procedure(s) Performed: XI ROBOTIC ASSISTED TOTAL HYSTERECTOMY WITH BILATERAL SALPINGO OOPHORECTOMY (N/A ) XI ROBOTIC ASSISTED LOWER ANTERIOR RESECTION, FLEXIBLE SIGMOIDOSCOPY; ICG (N/A )     Patient location during evaluation: PACU Anesthesia Type: General Level of consciousness: awake and alert Pain management: pain level controlled Vital Signs Assessment: post-procedure vital signs reviewed and stable Respiratory status: spontaneous breathing, nonlabored ventilation, respiratory function stable and patient connected to nasal cannula oxygen Cardiovascular status: blood pressure returned to baseline and stable Postop Assessment: no apparent nausea or vomiting Anesthetic complications: no    Last Vitals:  Vitals:   08/16/19 1602 08/16/19 1702  BP: 125/70 129/66  Pulse: 77 79  Resp: 16 17  Temp:  36.4 C  SpO2: 100% 100%    Last Pain:  Vitals:   08/16/19 1702  TempSrc: Oral  PainSc:                  Catalina Gravel

## 2019-08-16 NOTE — H&P (Signed)
CC: Referred by Dr. Rosendo Gros and Dr. Loletha Carrow for rectosigmoid inflammatory mass - here for surgery today  HPI: Stephanie Schwartz is a very pleasant 53yoF with hx of HLD, hypothyroidism, allergies whom presented to Dr. Loletha Carrow for screening colonoscopy at 53yo - 1st scope. This is completed 05/18/2019. She is found to have diminutive polyp in the cecum that was removed. Additionally a sessile, mass in the rectosigmoid colon estimated 18 cm from anal verge just above the most proximal rectal valve. This was biopsied multiple times and tattooed. This returned as an inflammatory polyp without dysplasia or malignancy. Cecal polyp return as a sessile serrated polyp without dysplasia. She denied ever having had any symptoms related to this. She denies cyclical rectal bleeding. She denies rectal bleeding. She does note her stools are somewhat looser during her cycle. She underwent CT abdomen and pelvis 05/26/2019 which demonstrated a 2.4 cm focal colonic soft tissue density near the rectosigmoid junction suspicious for colon carcinoma. Additionally 5.3 cm new cystic lesion in the left adnexa and 4.6 cm right posterior adnexal lesion. Ovarian neoplasm cannot be excluded. 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  She is doing well today. She denies any complaints. She reports being seen by her gynecologist, Dr. Ronita Hipps, with [facility] and 2 days ago had a CA-125 as well as transvaginal ultrasound performed. We do not have copies of this. She reports being told that he saw findings concerning for endometriosis. She has a referral to Dr. Denman George currently pending and is scheduled to see her 06/13/2019.  She was seen by Dr. Denman George 06/13/2019 and returns for f/u today. Dr. Denman George has gone over everything with her from a GYN standpoint and has opted to pursue elective surgery to establish definitive diagnosis.  She denies any  complaints today or changes in her health history. She tolerated her bowel prep and had a good result. She states she is ready for surgery   PMH: HLD (well controlled with statin), hypothyroidism (well controlled with synthroid), allergies  PSH: Laparoscopic appendectomy-2016 (path showed perforated appendicitis without tumor/malignancy). Denies any other prior abdominal or pelvic surgeries.  FHx: Denies FHx of malignancy including colorectal, breast, or ovarian.  Social: Denies use of tobacco/EtOH/drugs. 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  Past Medical History:  Diagnosis Date  . ALLERGIC RHINITIS 03/01/2008  . Allergy   . Anxiety 02/25/2012  . Arthritis    left thumb  . ASTHMA    "allergy induced"  . Asthma    seasonal  . ASTHMATIC BRONCHITIS, ACUTE 10/14/2008  . Heart murmur    "maybe" (12/02/2014)  . Hemangioma of liver   . HYPERCHOLESTEROLEMIA 08/21/2007  . HYPOTHYROIDISM   . OTITIS MEDIA, SEROUS, ACUTE, LEFT 03/15/2008  . Ovarian mass   . PONV (postoperative nausea and vomiting)   . SHINGLES 06/07/2008  . Wheezing 10/14/2009    Past Surgical History:  Procedure Laterality Date  . COLONOSCOPY  05/2019  . cyst removed from neck area     age 53  . LAPAROSCOPIC APPENDECTOMY N/A 12/02/2014   Procedure: APPENDECTOMY LAPAROSCOPIC;  Surgeon: Ralene Ok, MD;  Location: [facility];  Service: General;  Laterality: N/A;  . REDUCTION MAMMAPLASTY Bilateral ~ 1995  . SHOULDER ARTHROSCOPY  Left ~ 2009  . WISDOM TOOTH EXTRACTION     with sedation    Family History  Problem Relation Age of Onset  . Alcohol abuse Other        ETOH  . Cancer Other        Breast Cancer  . Hypertension Other   . Hyperlipidemia Other   . Diabetes Other    . Breast cancer Maternal Aunt   . Colon polyps Father   . Colon cancer Neg Hx   . Esophageal cancer Neg Hx   . Rectal cancer Neg Hx   . Stomach cancer Neg Hx     Social:  reports that she has never smoked. She has never used smokeless tobacco. She reports current alcohol use of about 14.0 standard drinks of alcohol per week. She reports that she does not use drugs.  Allergies: No Known Allergies  Medications: I have reviewed the patient's current medications.  No results found for this or any previous visit (from the past 48 hour(s)).  No results found.  ROS - all of the below systems have been reviewed with the patient and positives are indicated with bold text General: chills, fever or night sweats Eyes: blurry vision or double vision ENT: epistaxis or sore throat Allergy/Immunology: itchy/watery eyes or nasal congestion Hematologic/Lymphatic: bleeding problems, blood clots or swollen lymph nodes Endocrine: temperature intolerance or unexpected weight changes Breast: new or changing breast lumps or nipple discharge Resp: cough, shortness of breath, or wheezing CV: chest pain or dyspnea on exertion GI: as per HPI GU: dysuria, trouble voiding, or hematuria MSK: joint pain or joint stiffness Neuro: TIA or stroke symptoms Derm: pruritus and skin lesion changes Psych: anxiety and depression  PE Blood pressure 117/77, pulse 67, temperature 97.8 F (36.6 C), temperature source Oral, resp. rate 16, height 5\' 9"  (1.753 m), weight 86.4 kg, last menstrual period 07/30/2019, SpO2 99 %. Constitutional: NAD; conversant; no deformities; wearing surgical mask Eyes: Moist conjunctiva; no lid lag; anicteric; PERRL Neck: Trachea midline; no thyromegaly Lungs: Normal respiratory effort; no tactile fremitus CV: RRR; no palpable thrills; no pitting edema GI: Abd soft, NT/ND; no palpable hepatosplenomegaly MSK: Normal gait; no clubbing/cyanosis Psychiatric: Appropriate affect; alert and  oriented x3 Lymphatic: No palpable cervical or axillary lymphadenopathy  No results found for this or any previous visit (from the past 48 hour(s)).  No results found.   A/P: Ms. Moczygemba is a very pleasant 44yoF with hx of HLD, hypothyroidism, allergies - here today for follow-up evaluation of an inflammatory lesion of her rectosigmoid colon  -Dr. Denman George is planning robotic assisted TAH/BSO and possible removal of affected segment of rectosigmoid colon. -We discussed possibility of low anterior resection vs disk excision of wall based on intraoperative findings with Dr. Denman George as well. Flexible sigmoidoscopy based on findings, but likely. -The anatomy and physiology of the GI tract was discussed at length with the patient. The pathophysiology of her condition was discussed at length as well again today -The planned procedure, material risks (including, but not limited to, pain, bleeding, infection, scarring, need for blood transfusion, damage to surrounding structures- blood vessels/nerves/viscus/organs, damage to ureter, urine leak, leak from anastomosis, need for additional procedures, need for stoma which may be permanent, hernia, recurrence, pneumonia, heart attack, stroke, death) benefits and alternatives to surgery were discussed at length. We discussed that this procedure is being done to establish diagnosis and hopefully treat the underlying cause as outlined by Dr. Denman George. The patient's questions were answered to her  satisfaction again today, she voiced understanding and elected to proceed with surgery. Additionally, we discussed typical postoperative expectations and the recovery process.  Sharon Mt. Dema Severin, M.D. [facility]

## 2019-08-16 NOTE — Op Note (Addendum)
08/16/2019  1:31 PM  PATIENT:  Stephanie Schwartz  53 y.o. female  Patient Care Team: Biagio Borg, MD as PCP - General Magrinat, Virgie Dad, MD as Consulting Physician (Oncology)  PRE-OPERATIVE DIAGNOSIS:  Stage IV endometriosis  POST-OPERATIVE DIAGNOSIS:  Same  PROCEDURE:   1. Robotic assisted low anterior resection 2. Flexible sigmoidoscopy - which was necessary for lesion localization in order to determine exact point of resection 3. Intraoperative assessment of perfusion with ICG  SURGEON:  Sharon Mt. Dema Severin, MD  ASSISTANT: Michael Boston, MD (an MD assistant was necessary for tissue manipulation, management of robotic instrumentation, retraction and positioning due to the complexity of the case and hospital policies).   ANESTHESIA:   general  COUNTS:  Sponge, needle and instrument counts were reported correct x2 at the conclusion of the operation.  EBL: 50 mL  DRAINS: None  SPECIMEN: Rectosigmoid colon - open end proximal  COMPLICATIONS: None  FINDINGS: Significant involvement of her endometriosis with the anterior wall of the proximal rectum.  The proximal tattoo was visible but the distal tattoo was not.  For these reasons and to confirm the location of all involvement, a flexible sigmoidoscopy was performed to evaluate the rectum and distal sigmoid colon.  Endoscopy confirmed the proximal rectum was involved.  There was dense inflammatory/fibrotic reaction at this location that obliterated the proximalmost aspect of the rectovaginal septum.  This is not amenable to a disc excision/resection.  Therefore the decision was made to proceed with a low anterior resection.  The rectum was divided at a healthy appearing location approximately 1 cm distal to the significant endometrial disease.  The rectosigmoid colon was then resected.  ICG perfusion demonstrated excellent blood flow to the descending colon at the point of transection.  A tension-free, airtight, well perfused  colorectal anastomosis was fashioned and was found to be 13 cm from the anal verge by flexible endoscopy.  DESCRIPTION: The patient was identified in preop holding and taken to the OR with Dr. Serita Grit team for planned robotic assisted laparoscopic total hysterectomy with bilateral salpingo-oophorectomy and bilateral ureterolysis.  Please refer to her note for details.  She was already in the lithotomy position with the robot docked.  I reviewed the case and operative findings with Dr. Denman George. A surgical timeout was performed indicating the correct patient, procedure, positioning and need for preoperative antibiotics. I surveyed the abdomen.  The tattoo is apparent and likely the proximal tattoo and was located at the rectosigmoid junction.  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  This had somewhat obscured and obliterated the plane between the rectum and the peritoneal reflection.  The rectum at this level was quite firm.  To confirm that this was actually the location of all of her disease and nothing was more distal as well as where her distal tattoo was, a flexible sigmoidoscopy was performed.  I went below to perform this with the robot in place to view the external wall the rectum and localize scope location.  The flexible endoscope was inserted into the anal canal under direct visualization.  The area of disease was identified and did extend slightly distal to the location of her external component of disease.  The distal tattoo was visible.  This also occupied more than 50% of the lumen.  With the location of her lesion properly confirmed, attention was turned to treatment of this lesion.  It was not amenable to a wedge or disc excision as this would leave a significant waist in the segment of colon and likely lead to large bowel obstruction.  The robot was then  undocked.  The robot was re-docked from the patient's left side.  Two additional 5 mm assist ports ended up being placed-one in the right lower quadrant 1 in the right mid abdomen.  The area of disease was reidentified.  Both her left and right ureters were identified. The rectosigmoid colon was grasped and elevated anteriorly.  The right side of the proximal mesorectum was incised.  The avascular, TME plane, was gained.  The presacral fascia was left down along with the hypogastric nerves and the fascia propria of the rectum was left investing the mesorectum.  This dissection was carried out in a sharp manner.  We began this dissection in the posterior plane mobilizing the proximal mid rectum posteriorly.  Attention was then turned to isolation of the IMA pedicle.  Division of this vessel was necessary in order to facilitate removal of the segment of colon as well as a tension-free anastomosis.  The IMA was circumferentially dissected approximately 1 to 2 cm from its takeoff from the aorta.  The location of the left ureter was again reconfirmed.  After inspecting it was well out of our field of dissection, attention was turned to dividing the IMA.  This was done with the vessel sealer.  The stump was inspected and noted to be hemostatic.  This plane was further developed cephalad mobilizing the left mesocolon.  The IMV was preserved.  Attention was then turned to mobilizing the sigmoid and descending colon.  The sigmoid had already been partially mobilized from Dr. Serita Grit portion of the procedure.  Further attachments to the intersigmoid fossa were taken down.  Working up onto the descending colon, the Nailyn Dearinger line of Toldt was incised sharply and the descending colon mobilized.  The associated mesentery was mobilized and Gerota's was identified.  Attention was then turned back to the pelvic portion of the procedure.  The posterior plane having already having been developed, the lateral planes were dissected.   Portions of the lateral stalks were divided which was necessary to incorporate all segments of diseased rectum.  Lastly, the anterior plane was dissected.  There was significant scarring in the rectovaginal septum but distal to the area of endometrial involvement, there was a clear plane which is able to be gained. The vaginal cuff was avoided.  The rectum was protected from injury as was the posterior wall the vagina.  Meeting from both sides, the overlying fibrotic peritoneum was then divided.  This was below the peritoneal reflection where healthy normal rectum was identified.   At this point, the flexible sigmoidoscope was reintroduced into the anal canal after clamping the colon proximally.  We confirmed that we had a good distal point of planned transection below her area of disease.  After confirming this, attention was turned to clearing the mesorectum.  The mesorectum was cleared at the point of planned transection with the vessel sealer.  Under visualization, the air seal device was then exchanged for standard insufflation tubing and the air seal was removed and a 12 mm robotic trocar placed.  Through this, the robotic 20mm linear cutting green load stapler was used to divide the rectum.  This required one additional fire to clear all tissue.  The staple line was inspected and noted to be hemostatic and intact.  The distal descending colon easily  reached into the pelvis at the point of planned transection without any tension and remained in that location.  Attention was turned to the proximal point of transection.  Following the IMA pedicle up onto the junction of the sigmoid/descending colon, the mesentery was cleared with the vessel sealer.  At this point, attention was turned to performing a perfusion test.  ICG was administered and under firefly guidance, the descending colon was evaluated.  There was excellent perfusion out to the level of the divided mesentery.  The rectal stump was inspected and  also noted to have great perfusion.  The rectal stump was pink in appearance.  The pelvis was then irrigated with saline and hemostasis confirmed.  The robot was undocked.  The Pfannenstiel incision was created 2 fingerbreadths above the pubic symphysis.  The subcutaneous tissue dissected electrocautery.  The rectus fascia was incised transversely.  The fascia was then raised cephalad and caudad.  Under pneumoperitoneum, the midline peritoneum was incised.  An [place] wound protector was placed.  Blue towels were placed around the field.  The specimen was then delivered.  The point of planned transection was identified on the distal descending colon.  The pursestring device was applied.  A 2-0 Prolene on a Keith needle was passed.  The colon was divided and rectosigmoid colon passed off as specimen with the open end being proximal.  3-0 silk belt loop sutures were then placed around the pursestring line.  The colon at this level had excellent bleeding and was pink in appearance.  EEA sizers were then passed.  A 31 mm EEA stapler was selected.  The anvil was placed and the pursestring tied.  A small amount of appendices epiploica fat was cleared from the planned staple line.  This was placed back in the abdomen and a wound protector Was placed.  The abdomen is reinsufflated.  The laparoscope was inserted and I went below.  Flexible sigmoidoscopy demonstrated a normal-appearing rectal stump and no evidence of any remnant endometriosis.  EEA sizers were passed and the 31 mm EEA stapler was passed under direct visualization.  The spike was deployed just anterior to the staple line.  The components were mated.  Orientation was checked to confirm that there is no twisting of the colon or bowel underneath the mesentery.  The stapler was then closed, held, and fired.  The donuts were inspected and noted to be complete.  A leak test was performed with the colon proximal to the anastomosis clamped.  The pelvis was filled  with sterile saline.  The flexible sigmoidoscope was passed and demonstrated a normal-appearing anastomosis which was hemostatic, airtight, and pink in color.  From the abdominal side, there was a nice "lay" of the colon without any tension.  Irrigation was evacuated and hemostasis confirmed.  I scrubbed back in.  We then closed the 12 mm port site with a 0 Vicryl using a laparoscopic suture passer.  Trochars were removed under direct visualization and the port sites were hemostatic.  Pneumoperitoneum had been evacuated.  The wound protector was removed.  Attention was turned to closure.  Sponge, needle, and instrument counts were reported correct x2.  All equipment was exchanged for clean equipment and gown/gloves of all participants. The peritoneum at the Pfannenstiel was closed with a running 0 Vicryl suture.  The fascia was closed with 2 running #1 PDS sutures.  The wounds were irrigated.  Additional local anesthetic was infiltrated.  The skin of all incision sites was closed with 4-0  Monocryl subcuticular suture.  Dermabond was applied to all wounds.  A honeycomb dressing was placed over the Pfannenstiel incision.  She was then taken out of lithotomy position, awakened from anesthesia, extubated, and transferred to a stretcher for transport to PACU in satisfactory condition  DISPOSITION: PACU in satisfactory condition

## 2019-08-16 NOTE — Progress Notes (Signed)
Per Dr. Denman George, give Dr. Orest Dikes ordered antibiotic (cefotetan) and to give heparin instead of lovenox.

## 2019-08-16 NOTE — Transfer of Care (Signed)
Immediate Anesthesia Transfer of Care Note  Patient: Stephanie Schwartz  Procedure(s) Performed: XI ROBOTIC ASSISTED TOTAL HYSTERECTOMY WITH BILATERAL SALPINGO OOPHORECTOMY (N/A ) XI ROBOTIC ASSISTED LOWER ANTERIOR RESECTION, FLEXIBLE SIGMOIDOSCOPY; ICG (N/A )  Patient Location: PACU  Anesthesia Type:General  Level of Consciousness: awake, drowsy and patient cooperative  Airway & Oxygen Therapy: Patient Spontanous Breathing and Patient connected to face mask oxygen  Post-op Assessment: Report given to RN and Post -op Vital signs reviewed and stable  Post vital signs: Reviewed and stable  Last Vitals:  Vitals Value Taken Time  BP 130/74 08/16/19 1355  Temp    Pulse 69 08/16/19 1358  Resp 17 08/16/19 1358  SpO2 100 % 08/16/19 1358  Vitals shown include unvalidated device data.  Last Pain:  Vitals:   08/16/19 0539  TempSrc:   PainSc: 0-No pain      Patients Stated Pain Goal: 4 ([id number])  Complications: No apparent anesthesia complications

## 2019-08-16 NOTE — Op Note (Signed)
OPERATIVE NOTE 08/16/19  Surgeon: Donaciano Eva   Assistants: Dr Lahoma Crocker (an MD assistant was necessary for tissue manipulation, management of robotic instrumentation, retraction and positioning due to the complexity of the case and hospital policies).   Anesthesia: General endotracheal anesthesia  ASA Class: 3   Pre-operative Diagnosis: stage IV endometrioisis  Post-operative Diagnosis: same  Operation: Robotic-assisted laparoscopic total hysterectomy with bilateral salpingoophorectomy, bilateral ureterolysis  Surgeon: Donaciano Eva  Assistant Surgeon: Lahoma Crocker MD  Anesthesia: GET  Urine Output: 200cc  Operative Findings:  8cm utuers, bilateral 6cm ovarian cysts filled with chocolate colored fluid, dense uterosacral adhesions between the ovaries and uterosacral ligaments and the left ovary and anterior rectal wall. 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 Frozen section revealed endometriomas in both ovaries.   Estimated Blood Loss:  less than 50 mL      Total IV Fluids: 900 ml         Specimens: washings, uterus with cervix and bilateral tubes and ovaries.          Complications:  None; patient tolerated the procedure well.         Disposition: PACU - hemodynamically stable.  Procedure Details  The patient was seen in the Holding Room. The risks, benefits, complications, treatment options, and expected outcomes were discussed with the patient.  The patient concurred with the proposed plan, giving informed consent.  The site of surgery properly noted/marked. The patient was identified as Stephanie Schwartz and the procedure verified as a Robotic-assisted hysterectomy with bilateral salpingo oophorectomy. A Time Out was held and the above information confirmed.  After induction of anesthesia, the patient was draped and prepped in the usual sterile manner. Pt was placed in supine position after anesthesia and draped  and prepped in the usual sterile manner. The abdominal drape was placed after the CholoraPrep had been allowed to dry for 3 minutes.  Her arms were tucked to her side with all appropriate precautions.  The shoulders were stabilized with padded shoulder blocks applied to the acromium processes.  The patient was placed in the semi-lithotomy position in [place].  The perineum was prepped with Betadine. The patient was then prepped. Foley catheter was placed.  A sterile speculum was placed in the vagina.  The cervix was grasped with a single-tooth tenaculum and dilated with Kennon Rounds dilators.  The ZUMI uterine manipulator with a medium colpotomizer ring was placed without difficulty.  A pneum occluder balloon was placed over the manipulator.  OG tube placement was confirmed and to suction.   Next, a 5 mm skin incision was made 1 cm below the subcostal margin in the midclavicular line.  The 5 mm Optiview port and scope was used for direct entry.  Opening pressure was under 10 mm CO2.  The abdomen was insufflated and the findings were noted as above.   At this point and all points during the procedure, the patient's intra-abdominal pressure did not exceed 15 mmHg. Next, a 8 mm skin incision was made in the umbilicus and a right and left port was placed about 10 cm lateral to the robot port on the right and left side.  A fourth arm was placed in the right mid abdomen laterally.  All ports were placed under direct visualization.  The patient was placed in steep Trendelenburg.  Bowel was folded away into the upper abdomen.  The robot was docked in the normal manner.  The hysterectomy was started after the round ligament on the  right side was incised and the retroperitoneum was entered and the pararectal space was developed.  The ureter was noted to be on the medial leaf of the broad ligament.  Meticulous dissection was performed to skeletonize the ureter from its investments into retroperitoneal fibrosis. This  enabled the ureter to be 360degree skeletonized. The peritoneum above the ureter was incised and stretched and the infundibulopelvic ligament was skeletonized, cauterized and cut.  The posterior peritoneum was taken down to the level of the KOH ring.  In doing so, sharp dissection was necessary to separate the dense ovarian adhesions between the right ovarian fossa and right uterosacral ligament. The anterior peritoneum was also taken down.  The bladder flap was created to the level of the KOH ring.  The uterine artery on the right side was skeletonized, cauterized and cut in the normal manner at the isthmus.  A similar procedure was performed on the left.  The dense adhesive disease and endometriosis was worse on the left uterosacral/ovarian complex. This was carefully dissected off of the rectum. The ureter on the left was similarly invested in fibrosis and it was skeletonized and mobilized laterally from the endometriosis and fibrosis medially. The ureter was untunnelled under the uterine artery which on the left was skeletonized at its origin and bipolar sealed at its origin where it left the internal iliac artery.   The posterior peritoneum and rectal attachments to the vaginal cuff were taken down with sharp dissection. This was a very dense tissue plane. The colpotomy was made and the uterus, cervix, bilateral ovaries and tubes were amputated and delivered through the vagina.  Pedicles were inspected and excellent hemostasis was achieved.    The colpotomy at the vaginal cuff was closed with Vicryl on a CT1 needle in a running manner.  Irrigation was used and excellent hemostasis was achieved.  At this point in the gynecologic procedure was completed and Dr Dema Severin proceeded with the remainder of the procedure.  Please refer to his separate note for details.   Donaciano Eva, MD

## 2019-08-16 NOTE — Anesthesia Procedure Notes (Addendum)
Procedure Name: Intubation Date/Time: 08/16/2019 7:44 AM Performed by: Eben Burow, CRNA Pre-anesthesia Checklist: Patient identified, Emergency Drugs available, Suction available, Patient being monitored and Timeout performed Patient Re-evaluated:Patient Re-evaluated prior to induction Oxygen Delivery Method: Circle system utilized Preoxygenation: Pre-oxygenation with 100% oxygen Induction Type: IV induction Ventilation: Mask ventilation without difficulty Laryngoscope Size: Mac and 4 Grade View: Grade I Tube type: Oral Tube size: 7.5 mm Number of attempts: 1 Airway Equipment and Method: Stylet Placement Confirmation: ETT inserted through vocal cords under direct vision,  positive ETCO2 and breath sounds checked- equal and bilateral Secured at: 22 cm Tube secured with: Tape Dental Injury: Teeth and Oropharynx as per pre-operative assessment  Comments: Intubated by Margarite Gouge

## 2019-08-17 ENCOUNTER — Encounter (HOSPITAL_COMMUNITY): Payer: Self-pay | Admitting: Gynecologic Oncology

## 2019-08-17 LAB — CBC
HCT: 35.6 % — ABNORMAL LOW (ref 36.0–46.0)
Hemoglobin: 11.4 g/dL — ABNORMAL LOW (ref 12.0–15.0)
MCH: 32.6 pg (ref 26.0–34.0)
MCHC: 32 g/dL (ref 30.0–36.0)
MCV: 101.7 fL — ABNORMAL HIGH (ref 80.0–100.0)
Platelets: 306 10*3/uL (ref 150–400)
RBC: 3.5 MIL/uL — ABNORMAL LOW (ref 3.87–5.11)
RDW: 12.4 % (ref 11.5–15.5)
WBC: 8.1 10*3/uL (ref 4.0–10.5)
nRBC: 0 % (ref 0.0–0.2)

## 2019-08-17 LAB — BASIC METABOLIC PANEL
Anion gap: 7 (ref 5–15)
BUN: 10 mg/dL (ref 6–20)
CO2: 23 mmol/L (ref 22–32)
Calcium: 8.2 mg/dL — ABNORMAL LOW (ref 8.9–10.3)
Chloride: 108 mmol/L (ref 98–111)
Creatinine, Ser: 0.82 mg/dL (ref 0.44–1.00)
GFR calc Af Amer: >60 mL/min (ref >60)
GFR calc non Af Amer: >60 mL/min (ref >60)
Glucose, Bld: 98 mg/dL (ref 70–99)
Potassium: 4.1 mmol/L (ref 3.5–5.1)
Sodium: 138 mmol/L (ref 135–145)

## 2019-08-17 MED ORDER — ESTROGENS CONJUGATED 0.625 MG PO TABS: 0.6250 mg | ORAL_TABLET | Freq: Every day | ORAL | 11 refills | Status: DC

## 2019-08-17 MED ORDER — TRAMADOL HCL 50 MG PO TABS: 50.0000 mg | ORAL_TABLET | Freq: Four times a day (QID) | ORAL | 0 refills | Status: AC | PRN

## 2019-08-17 NOTE — Progress Notes (Signed)
Subjective No acute events. Feeling quite well. Denies n/v. Passing some flatus; denies bm yet. Tolerating liquids. Has been up walking around  Objective: Vital signs in last 24 hours: Temp:  [97.6 F (36.4 C)-99.2 F (37.3 C)] 98.8 F (37.1 C) (10/16 0553) Pulse Rate:  [59-81] 71 (10/16 0553) Resp:  [12-19] 14 (10/16 0553) BP: (105-131)/(63-82) 117/66 (10/16 0553) SpO2:  [97 %-100 %] 97 % (10/16 0553) Weight:  [91.8 kg] 91.8 kg (10/16 0456) Last BM Date: 08/16/19  Intake/Output from previous day: 10/15 0701 - 10/16 0700 In: 4902.3 [P.O.:240; I.V.:4562.3; IV Piggyback:100] Out: 2795 [Urine:2695; Blood:100] Intake/Output this shift: No intake/output data recorded.  Gen: NAD, comfortable CV: RRR Pulm: Normal work of breathing Abd: Soft, minimal appropriate tenderness; not significantly distended; dressings c/d without drainage or erythema Ext: SCDs in place  Lab Results: CBC  Recent Labs    08/17/19 0336  WBC 8.1  HGB 11.4*  HCT 35.6*  PLT 306   BMET Recent Labs    08/17/19 0336  NA 138  K 4.1  CL 108  CO2 23  GLUCOSE 98  BUN 10  CREATININE 0.82  CALCIUM 8.2*   PT/INR No results for input(s): LABPROT, INR in the last 72 hours. ABG No results for input(s): PHART, HCO3 in the last 72 hours.  Invalid input(s): PCO2, PO2  Studies/Results:  Anti-infectives: Anti-infectives (From admission, onward)   Start     Dose/Rate Route Frequency Ordered Stop   08/16/19 0600  cefOXitin (MEFOXIN) 2 g in sodium chloride 0.9 % 100 mL IVPB  Status:  Discontinued     2 g 200 mL/hr over 30 Minutes Intravenous On call to O.R. 08/16/19 [id number] 08/16/19 0526   08/16/19 0600  cefoTEtan (CEFOTAN) 2 g in sodium chloride 0.9 % 100 mL IVPB     2 g 200 mL/hr over 30 Minutes Intravenous On call to O.R. 08/16/19 0523 08/17/19 0026       Assessment/Plan: Patient Active Problem List   Diagnosis Date Noted  . Rectal mass 08/16/2019  . Endometriosis 08/16/2019  . Endometriosis of  both ovaries   . Idiopathic retroperitoneal fibrosis   . Ovarian mass 06/13/2019  . Menorrhagia with irregular cycle 06/13/2019  . Depression 03/06/2019  . Atypical lobular hyperplasia (ALH) of left breast 11/23/2018  . Knee pain, right 03/17/2018  . Arthritis of carpometacarpal [facility]) joint of left thumb 03/17/2018  . Hyperglycemia 03/01/2018  . Cough 01/09/2016  . Wheezing 01/09/2016  . Acute appendicitis 12/02/2014  . Liver mass 12/02/2014  . Anxiety 02/25/2012  . Insomnia 02/25/2012  . 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 ADD (attention deficit disorder) 01/29/2011  . Encounter for well adult exam with abnormal findings 01/29/2011  . Asthma 10/14/2008  . SHINGLES 06/07/2008  . Allergic rhinitis 03/01/2008  . Hypothyroidism 08/21/2007  . HYPERCHOLESTEROLEMIA 08/21/2007   s/p Procedure(s): XI ROBOTIC ASSISTED TOTAL HYSTERECTOMY WITH BILATERAL SALPINGO OOPHORECTOMY XI ROBOTIC ASSISTED LOWER ANTERIOR RESECTION, FLEXIBLE SIGMOIDOSCOPY; ICG 08/16/2019   -Recovering well; pain well controlled -We have reviewed operative findings and answered her questions -FLD; advance as tolerated; D/C IVF -Foley removed this morning -Continue Entereg until reliable return of bowel fxn -Ambulate 5x/day -PPx: SQH, SCDs   LOS: 1 day   Llana Deshazo M. Dema Severin, M.D. [facility]

## 2019-08-17 NOTE — Progress Notes (Addendum)
1 Day Post-Op Procedure(s) (LRB): XI ROBOTIC ASSISTED TOTAL HYSTERECTOMY WITH BILATERAL SALPINGO OOPHORECTOMY (N/A) XI ROBOTIC ASSISTED LOWER ANTERIOR RESECTION, FLEXIBLE SIGMOIDOSCOPY; ICG (N/A)  Subjective: Patient reports abdominal soreness but no significant pain.  No nausea or emesis reported.  Had "almost unbearable" right shoulder pain yesterday afternoon but states it has improved.  Ambulating without assistance.  Passing flatus. No concerns voiced.   Objective: Vital signs in last 24 hours: Temp:  [97.6 F (36.4 C)-99.2 F (37.3 C)] 98.8 F (37.1 C) (10/16 0553) Pulse Rate:  [59-81] 71 (10/16 0553) Resp:  [12-19] 14 (10/16 0553) BP: (105-131)/(63-82) 117/66 (10/16 0553) SpO2:  [97 %-100 %] 97 % (10/16 0553) Weight:  [202 lb 6.1 oz (91.8 kg)] 202 lb 6.1 oz (91.8 kg) (10/16 0456) Last BM Date: 08/16/19  Intake/Output from previous day: 10/15 0701 - 10/16 0700 In: 4902.3 [P.O.:240; I.V.:4562.3; IV Piggyback:100] Out: 2795 [Urine:2695; Blood:100]  Physical Examination: General: alert, cooperative, appears stated age and no distress Resp: clear to auscultation bilaterally Cardio: regular rate and rhythm, S1, S2 normal, no murmur, click, rub or gallop GI: incision: lap sites to the abdomen with dermabond without drainage, mild ecchymosis noted around two lap incisions, op site dressing in place over pfannenstiel incision with no active drainage noted underneath and abdomen soft, active bowel sounds Extremities: extremities normal, atraumatic, no cyanosis or edema  Full range of motion with the right upper extrem.  Labs: WBC/Hgb/Hct/Plts:  8.1/11.4/35.6/306 (10/16 [id number]) BUN/Cr/glu/ALT/AST/amyl/lip:  10/0.82/--/--/--/--/-- (10/16 [id number])  Assessment: 53 y.o. s/p Procedure(s): XI ROBOTIC ASSISTED TOTAL HYSTERECTOMY WITH BILATERAL SALPINGO OOPHORECTOMY XI ROBOTIC ASSISTED LOWER ANTERIOR RESECTION, FLEXIBLE SIGMOIDOSCOPY; ICG: stable Pain:  Pain is well-controlled on PRN  medications.  Heme: Hgb 11.4 and Hct 35.6 this am. Appropriate compared to pre-op levels and surgical losses.  CV: BP and HR stable post-op. Continue monitoring with routine vital sign assessments.  GI:  Tolerating po: Yes. Diet to be advanced this am to full liquids per Dr. Dema Severin.  Antiemetics ordered PRN. S/P low anterior resection.  GU: Foley removed this am.  Adequate urine output recorded. Creatinine 0.82 this am.    FEN: No critical values this am.  Prophylaxis: Heparin ordered Q8H and SCDs.  Plan: Diet to be advanced to full liquids this am. If tolerated, can advance to soft later today per Dr. Thornton Papas has been removed.  IV is saline locked. Encourage ambulation, IS use, deep breathing, and coughing Continue plan of care per Dr. Dema Severin   LOS: 1 day    Stephanie Schwartz 08/17/2019, 8:15 AM

## 2019-08-17 NOTE — Discharge Instructions (Signed)
08/16/2019  Return to work: 4-6 weeks  Activity: 1. Be up and out of the bed during the day.  Take a nap if needed.  You may walk up steps but be careful and use the hand rail.  Stair climbing will tire you more than you think, you may need to stop part way and rest.   2. No lifting or straining for 4-6 weeks.  3. No driving for 2 weeks.  Do Not drive if you are taking narcotic pain medicine.  4. Shower daily.  Use soap and water on your incision and pat dry; don't rub.   5. No sexual activity and nothing in the vagina for 8 weeks.  6. It is important that nothing is placed within the rectum (eg suppositories or enemas) until approved to do so by Dr Dema Severin.   Diet: 1. Low sodium Heart Healthy Diet is recommended.  2. It is safe to use a laxative if you have difficulty moving your bowels. Do not use enemas or suppositories.   Wound Care: 1. Keep clean and dry.  Shower daily.  Reasons to call the Doctor:   Fever - Oral temperature greater than 100.4 degrees Fahrenheit  Foul-smelling vaginal discharge  Difficulty urinating  Nausea and vomiting  Increased pain at the site of the incision that is unrelieved with pain medicine.  Difficulty breathing with or without chest pain  New calf pain especially if only on one side  Sudden, continuing increased vaginal bleeding with or without clots.   Follow-up: 1. See Everitt Amber in 3-4 weeks.  Contacts: For questions or concerns you should contact:  Dr. Everitt Amber at [phone] After hours and on week-ends call [phone] and ask to speak to the physician on call for Gynecologic Oncology    POST OP INSTRUCTIONS AFTER COLON SURGERY  1. DIET: Be sure to include lots of fluids daily to stay hydrated - 64oz of water per day (8, 8 oz glasses).  Avoid fast food or heavy meals for the first couple of weeks as your are more likely to get nauseated. Avoid raw/uncooked fruits or vegetables for the first 4 weeks (its ok to have  these if they are blended into smoothie form). If you have fruits/vegetables, make sure they are cooked until soft enough to mash on the roof of your mouth and chew your food well. Otherwise, diet as tolerated.  2. Take your usually prescribed home medications unless otherwise directed.  3. PAIN CONTROL: a. Pain is best controlled by a usual combination of three different methods TOGETHER: i. Ice/Heat ii. Over the counter pain medication iii. Prescription pain medication b. Most patients will experience some swelling and bruising around the surgical site.  Ice packs or heating pads (30-60 minutes up to 6 times a day) will help. Some people prefer to use ice alone, heat alone, alternating between ice & heat.  Experiment to what works for you.  Swelling and bruising can take several weeks to resolve.   c. It is helpful to take an over-the-counter pain medication regularly for the first few weeks: i. Ibuprofen (Motrin/Advil) - 200mg  tabs - take 3 tabs (600mg ) every 6 hours as needed for pain (unless you have been directed previously to avoid NSAIDs/ibuprofen) ii. Acetaminophen (Tylenol) - you may take 650mg  every 6 hours as needed. You can take this with motrin as they act differently on the body. If you are taking a narcotic pain medication that has acetaminophen in it, do not take over the  counter tylenol at the same time. iii. NOTE: You may take both of these medications together - most patients  find it most helpful when alternating between the two (i.e. Ibuprofen at 6am, tylenol at 9am, ibuprofen at 12pm ...) d. A  prescription for pain medication should be given to you upon discharge.  Take your pain medication as prescribed if your pain is not adequatly controlled with the over-the-counter pain reliefs mentioned above.  4. Avoid getting constipated.  Between the surgery and the pain medications, it is common to experience some constipation.  Increasing fluid intake and taking a fiber supplement  (such as Metamucil, Citrucel, FiberCon, MiraLax, etc) 1-2 times a day regularly will usually help prevent this problem from occurring.  A mild laxative (prune juice, Milk of Magnesia, MiraLax, etc) should be taken according to package directions if there are no bowel movements after 48 hours.    5. Dressing: Your incisions are covered in Dermabond which is like sterile superglue for the skin. This will come off on it's own in a couple weeks. It is waterproof and you may bathe normally starting the day after your surgery in a shower. Avoid baths/pools/lakes/oceans until your wounds have fully healed.  6. ACTIVITIES as tolerated:   a. Avoid heavy lifting (>10lbs or 1 gallon of milk) for the next 6 weeks. b. You may resume regular daily activities as tolerated--such as daily self-care, walking, climbing stairs--gradually increasing activities as tolerated.  If you can walk 30 minutes without difficulty, it is safe to try more intense activity such as jogging, treadmill, bicycling, low-impact aerobics.  c. DO NOT PUSH THROUGH PAIN.  Let pain be your guide: If it hurts to do something, don't do it. d. Dennis Bast may drive when you are no longer taking prescription pain medication, you can comfortably wear a seatbelt, and you can safely maneuver your car and apply brakes.  7. FOLLOW UP in our office a. Please call [facility] at [phone] to set up an appointment to see your surgeon in the office for a follow-up appointment approximately 2 weeks after your surgery. b. Make sure that you call for this appointment the day you arrive home to insure a convenient appointment time.  9. If you have disability or family leave forms that need to be completed, you may have them completed by your primary care physician's office; for return to work instructions, please ask our office staff and they will be happy to assist you in obtaining this documentation   When to call us [phone]: 1. Poor pain control 2. Reactions /  problems with new medications (rash/itching, etc)  3. Fever over 101.5 F (38.5 C) 4. Inability to urinate 5. Nausea/vomiting 6. Worsening swelling or bruising 7. Continued bleeding from incision. 8. Increased pain, redness, or drainage from the incision  The clinic staff is available to answer your questions during regular business hours (8:30am-5pm).  Please dont hesitate to call and ask to speak to one of our nurses for clinical concerns.   A surgeon from [facility] is always on call at the hospitals   If you have a medical emergency, go to the nearest emergency room or call 911.  [facility], [place] [address] [place], [place], [place]  [address] MAIN: [phone] FAX: [phone] [email or web address]

## 2019-08-18 NOTE — Discharge Summary (Signed)
Patient ID: Stephanie Schwartz MRN: [id number] DOB/AGE: 07/26/1966 53 y.o.  Admit date: 08/16/2019 Discharge date: 08/18/2019  Discharge Diagnoses Patient Active Problem List   Diagnosis Date Noted  . Rectal mass 08/16/2019  . Endometriosis 08/16/2019  . Endometriosis of both ovaries   . Idiopathic retroperitoneal fibrosis   . Ovarian mass 06/13/2019  . Menorrhagia with irregular cycle 06/13/2019  . Depression 03/06/2019  . Atypical lobular hyperplasia (ALH) of left breast 11/23/2018  . Knee pain, right 03/17/2018  . Arthritis of carpometacarpal [facility]) joint of left thumb 03/17/2018  . Hyperglycemia 03/01/2018  . Cough 01/09/2016  . Wheezing 01/09/2016  . Acute appendicitis 12/02/2014  . Liver mass 12/02/2014  . Anxiety 02/25/2012  . Insomnia 02/25/2012  . 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 ADD (attention deficit disorder) 01/29/2011  . Encounter for well adult exam with abnormal findings 01/29/2011  . Asthma 10/14/2008  . SHINGLES 06/07/2008  . Allergic rhinitis 03/01/2008  . Hypothyroidism 08/21/2007  . HYPERCHOLESTEROLEMIA 08/21/2007   Procedures XI ROBOTIC ASSISTED TOTAL HYSTERECTOMY WITH BILATERAL SALPINGO OOPHORECTOMY XI ROBOTIC ASSISTED LOWER ANTERIOR RESECTION, FLEXIBLE SIGMOIDOSCOPY; ICG 08/16/2019   Hospital Course: She was admitted to the hospital postoperatively where she recovered quite well. Her diet was advanced. She was voiding well on her own. She began passing gas and having BMs 10/16. She was ambulating on her own. Pain was well controlled on oral analgesics. She was comfortable with discharge and deemed stable for this.   Allergies as of 08/18/2019   No Known Allergies     Medication List    STOP taking these medications   oxyCODONE 5 MG immediate release tablet Commonly known as: Oxy IR/ROXICODONE     TAKE these medications   albuterol 108 (90 Base) MCG/ACT inhaler Commonly known as: VENTOLIN HFA Inhale 2 puffs into the lungs every 6 (six)  hours as needed for wheezing or shortness of breath.   ALPRAZolam 0.5 MG tablet Commonly known as: XANAX TAKE ONE TABLET BY MOUTH TWICE A DAY AS NEEDED ANXIETY What changed:   how much to take  how to take this  when to take this  additional instructions   escitalopram 10 MG tablet Commonly known as: Lexapro Take 1 tablet (10 mg total) by mouth daily. Then stop   estrogens (conjugated) 0.625 MG tablet Commonly known as: Premarin Take 1 tablet (0.625 mg total) by mouth daily. Take daily for 21 days then do not take for 7 days.   ibuprofen 800 MG tablet Commonly known as: ADVIL Take 1 tablet (800 mg total) by mouth every 8 (eight) hours as needed for moderate pain. For AFTER surgery only   levothyroxine 200 MCG tablet Commonly known as: SYNTHROID Take 200 mcg by mouth daily before breakfast.   montelukast 10 MG tablet Commonly known as: SINGULAIR TAKE ONE TABLET BY MOUTH DAILY   traMADol 50 MG tablet Commonly known as: Ultram Take 1 tablet (50 mg total) by mouth every 6 (six) hours as needed for up to 5 days (postop pain not controlled with tylenol/ibuprofen).          Sharon Mt. Dema Severin, M.D. [facility]

## 2019-08-18 NOTE — Progress Notes (Signed)
Pharmacy Brief Note - Alvimopan (Entereg)  The standing order set for alvimopan (Entereg) now includes an automatic order to discontinue the drug after the patient has had a bowel movement. The change was approved by the Commerce and the Medical Executive Committee.   This patient has had bowel movements documented by nursing. Therefore, alvimopan has been discontinued. If there are questions, please contact the pharmacy at [phone].   Thank you-  Dia Sitter, PharmD, BCPS 08/18/2019 10:33 AM

## 2019-08-20 ENCOUNTER — Telehealth: Payer: Self-pay | Admitting: *Deleted

## 2019-08-20 LAB — CYTOLOGY - NON PAP

## 2019-08-20 NOTE — Telephone Encounter (Signed)
Pt was on TCM report admitted 08/16/19 for postoperative procedure. Pt underwent a ROBOTIC ASSISTED TOTAL HYSTERECTOMY WITH BILATERAL SALPINGO OOPHORECTOMY & XI ROBOTIC ASSISTED LOWER ANTERIOR RESECTION, FLEXIBLE SIGMOIDOSCOPY. She tolerated procedure well, and D/c 08/18/19 and will follow-up w/surgeon in 2 weeks.Marland KitchenJohny Chess

## 2019-08-21 ENCOUNTER — Encounter: Payer: Self-pay | Admitting: Gynecologic Oncology

## 2019-08-21 ENCOUNTER — Telehealth: Payer: Self-pay

## 2019-08-21 LAB — SURGICAL PATHOLOGY

## 2019-08-21 NOTE — Telephone Encounter (Signed)
Ms Poncedeleon called requesting letter to return to work on 08/27/2019. Message routed to [name], NP

## 2019-08-22 ENCOUNTER — Telehealth: Payer: Self-pay

## 2019-08-22 NOTE — Telephone Encounter (Signed)
Told Stephanie Schwartz that the final pathology showed endometriosis only in gyn and colon specimens.  No cancer per [name], NP.

## 2019-09-05 ENCOUNTER — Other Ambulatory Visit: Payer: Self-pay

## 2019-09-05 ENCOUNTER — Encounter: Payer: Self-pay | Admitting: Gynecologic Oncology

## 2019-09-05 ENCOUNTER — Inpatient Hospital Stay: Payer: 59 | Attending: Gynecologic Oncology | Admitting: Gynecologic Oncology

## 2019-09-05 VITALS — BP 115/49 | HR 76 | Temp 98.4°F | Resp 16 | Ht 69.0 in | Wt 186.6 lb

## 2019-09-05 DIAGNOSIS — R197 Diarrhea, unspecified: Secondary | ICD-10-CM | POA: Insufficient documentation

## 2019-09-05 DIAGNOSIS — F419 Anxiety disorder, unspecified: Secondary | ICD-10-CM | POA: Insufficient documentation

## 2019-09-05 DIAGNOSIS — R011 Cardiac murmur, unspecified: Secondary | ICD-10-CM | POA: Insufficient documentation

## 2019-09-05 DIAGNOSIS — Z780 Asymptomatic menopausal state: Secondary | ICD-10-CM | POA: Insufficient documentation

## 2019-09-05 DIAGNOSIS — Z79899 Other long term (current) drug therapy: Secondary | ICD-10-CM | POA: Diagnosis not present

## 2019-09-05 DIAGNOSIS — Z79890 Hormone replacement therapy (postmenopausal): Secondary | ICD-10-CM | POA: Diagnosis not present

## 2019-09-05 DIAGNOSIS — E7800 Pure hypercholesterolemia, unspecified: Secondary | ICD-10-CM | POA: Insufficient documentation

## 2019-09-05 DIAGNOSIS — N809 Endometriosis, unspecified: Secondary | ICD-10-CM | POA: Insufficient documentation

## 2019-09-05 DIAGNOSIS — J45909 Unspecified asthma, uncomplicated: Secondary | ICD-10-CM | POA: Diagnosis not present

## 2019-09-05 DIAGNOSIS — Z90722 Acquired absence of ovaries, bilateral: Secondary | ICD-10-CM

## 2019-09-05 DIAGNOSIS — Z90710 Acquired absence of both cervix and uterus: Secondary | ICD-10-CM | POA: Diagnosis not present

## 2019-09-05 DIAGNOSIS — N838 Other noninflammatory disorders of ovary, fallopian tube and broad ligament: Secondary | ICD-10-CM

## 2019-09-05 DIAGNOSIS — R971 Elevated cancer antigen 125 [CA 125]: Secondary | ICD-10-CM | POA: Diagnosis not present

## 2019-09-05 DIAGNOSIS — E039 Hypothyroidism, unspecified: Secondary | ICD-10-CM | POA: Diagnosis not present

## 2019-09-05 DIAGNOSIS — M1990 Unspecified osteoarthritis, unspecified site: Secondary | ICD-10-CM | POA: Diagnosis not present

## 2019-09-05 MED ORDER — IBUPROFEN 800 MG PO TABS: 800.0000 mg | ORAL_TABLET | Freq: Three times a day (TID) | ORAL | 1 refills | Status: DC | PRN

## 2019-09-05 NOTE — Patient Instructions (Signed)
Dr Denman George recommends either continuing the premarin for 5 years or tapering off the premarin, considering your menopausal symptoms, and stopping versus restarting pending on how you feel.   You will be cleared for all activities when you are 4 weeks postop.  Please notify either Dr Denman George or Dr Dema Severin if you have concerns about how you are healing postop or notice new concerns.

## 2019-09-05 NOTE — Progress Notes (Signed)
Follow-up Note: Gyn-Onc  Consult was requested by Dr. Ronita Hipps and Dr Dema Severin for the evaluation of Stephanie Schwartz 53 y.o. female  CC:  Chief Complaint  Patient presents with  . Endometriosis    Assessment/Plan:  Stephanie Schwartz  is a 70 y.o. who is status post robotic assisted total hysterectomy BSO, low anterior resection and primary coloproctostomy for stage IV endometriosis involving the rectovaginal septum.  She is doing very very well postoperatively.  She is taking Premarin for hormone replacement however does not feel particularly gains while on this.  I counseled her regarding it being safe to discontinue Premarin.  She can taper off this for more subtle cessation.  If she notes significantly worse quality of life while off the estrogen replacement she can reinstitute this.  I discussed that estrogen replacement therapy is not associated with an increased breast cancer risk as recently documented in the updated WHO studies results.  I counseled her regarding activity levels that would be safe to initiate.  She has follow-up with Dr. Dema Severin in 1 week's time.  She will follow-up with me in Dr. Ronita Hipps on an as needed basis.  HPI: Stephanie Schwartz is a 69 year old P1 who is seen in consultation at the request of Dr Ronita Hipps and Dr Dema Severin for bilateral adnexal masses and a rectal mass.  The patient was undergoing routine colonoscopic screening and a sessile, partially obstructing mass was found in the rectosigmoid colon about 18 cm from the anal verge, just above the most proximal rectal valve fold.  Its location and morphology made biopsying it challenging.  However biopsies were taken from this mass.  Pathology revealed an inflammatory polyp with no malignancy seen.  A CT scan of the abdomen and pelvis was then performed on May 25, 2019.  It revealed a 5.1 cm low-attenuation mass in the anterior right hepatic lobe that had been stable from prior 2016 scan consistent with a benign lesion.   In the pelvis the uterus was unremarkable.  There was a complex cystic lesion containing several thin internal septation seen in the left adnexa measuring 5.3 x 4 cm.  It was new from 2016.  A benign-appearing cystic lesion was seen in the right posterior adnexa measuring 4.6 x 3.6 cm and is also new.  There is no ascites.  In the colon a focal soft tissue density was seen along the right lateral colonic wall near the rectosigmoid junction measuring 2.4 x 1.8 cm.  It was suspicious for a colon carcinoma.  There is no evidence of bowel obstruction.  There was no carcinomatosis, no lymphadenopathy.  The patient was then seen by her OB/GYN, Dr. Ronita Hipps, on June 04, 2019.  This revealed a right ovarian complex cyst with diffuse homogeneous internal echoes measuring 4.6 x 3.6 x 3.2 cm with no increased blood flow.  The multiple left ovarian complex cyst with low internal echoes noted measuring 3.5 x 3.3 x 3 cm.  The uterus was grossly normal and measured 10.4 x 6.3 x 4.5 cm.  The cysts were felt to be most consistent ultrasound imaging with an endometrioma.  A Ca1 25 was drawn that same day which was elevated at 57.  The patient reports that she is been beginning to have irregular menses in the year of 2019 and 2020.  When she does have menstrual cycles they are heavier than usual.  She is a lifelong history of intermittent diarrhea that she does not feel is worse than usual.  She denies bloody stools.  She denies that her GI symptoms change at the time of her cycle.  She denies significant cyclical pelvic pain.  She is never been diagnosed with endometriosis.  She has a history of one prior vaginal delivery 16 years ago.  She has a history of a laparoscopic appendectomy with Dr. Rosendo Gros in 2016.  Interval Hx:  On August 16, 2019 she underwent a robotic assisted total hysterectomy with bilateral salpingo-oophorectomy, bilateral ureterolysis, and a low anterior resection with coloproctostomy.  The procedure  was uncomplicated.  Her surgical findings were significant for an 8 cm uterus, bilateral 6 and mean ovarian cyst filled with chocolate colored fluid, dense uterosacral adhesions between the ovaries and uterosacral ligaments in the left ovary and anterior rectal wall.  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  Frozen section and final pathology confirmed bilateral endometriomas involving the anterior rectal wall and bilateral ovaries.  She was discharged on postoperative day 2 and did exceptionally well postoperatively with no complaints.  She commenced postoperative Premarin orally for symptom relief.  However she denied having significant menopausal symptoms.  Current Meds:  Outpatient Encounter Medications as of 09/05/2019  Medication Sig  . albuterol (VENTOLIN HFA) 108 (90 Base) MCG/ACT inhaler Inhale 2 puffs into the lungs every 6 (six) hours as needed for wheezing or shortness of breath.  . ALPRAZolam (XANAX) 0.5 MG tablet TAKE ONE TABLET BY MOUTH TWICE A DAY AS NEEDED ANXIETY (Patient taking differently: Take 0.5 mg by mouth at bedtime. )  . escitalopram (LEXAPRO) 10 MG tablet Take 1 tablet (10 mg total) by mouth daily. Then stop  . estrogens, conjugated, (PREMARIN) 0.625 MG tablet Take 1 tablet (0.625 mg total) by mouth daily. Take daily for 21 days then do not take for 7 days.  Marland Kitchen ibuprofen (ADVIL) 800 MG tablet Take 1 tablet (800 mg total) by mouth every 8 (eight) hours as needed for moderate pain. For AFTER surgery only  . levothyroxine (SYNTHROID) 200 MCG tablet Take 200 mcg by mouth daily before breakfast.   . montelukast (SINGULAIR) 10 MG tablet TAKE ONE TABLET BY MOUTH DAILY (Patient taking differently: Take 10 mg by mouth daily. )   No facility-administered encounter medications on file as of 09/05/2019.     Allergy: No Known Allergies  Social Hx:   Social History   Socioeconomic History  . Marital status: Divorced    Spouse name: Not  on file  . Number of children: 1  . Years of education: Not on file  . Highest education level: Not on file  Occupational History  . Not on file  Social Needs  . Financial resource strain: Not on file  . Food insecurity    Worry: Not on file    Inability: Not on file  . Transportation needs    Medical: Not on file    Non-medical: Not on file  Tobacco Use  . Smoking status: Never Smoker  . Smokeless tobacco: Never Used  Substance and Sexual Activity  . Alcohol use: Yes    Alcohol/week: 14.0 standard drinks    Types: 14 Glasses of wine per week  . Drug use: No  . Sexual activity: Yes    Birth control/protection: None    Comment: Lesbian  Lifestyle  . Physical activity    Days per week: Not on file    Minutes per session: Not on file  . Stress: Not on file  Relationships  . Social connections    Talks  on phone: Not on file    Gets together: Not on file    Attends religious service: Not on file    Active member of club or organization: Not on file    Attends meetings of clubs or organizations: Not on file    Relationship status: Not on file  . Intimate partner violence    Fear of current or ex partner: Not on file    Emotionally abused: Not on file    Physically abused: Not on file    Forced sexual activity: Not on file  Other Topics Concern  . Not on file  Social History Narrative  . Not on file    Past Surgical Hx:  Past Surgical History:  Procedure Laterality Date  . COLONOSCOPY  05/2019  . cyst removed from neck area     age 37  . LAPAROSCOPIC APPENDECTOMY N/A 12/02/2014   Procedure: APPENDECTOMY LAPAROSCOPIC;  Surgeon: Ralene Ok, MD;  Location: [facility];  Service: General;  Laterality: N/A;  . REDUCTION MAMMAPLASTY Bilateral ~ 1995  . ROBOTIC ASSISTED TOTAL HYSTERECTOMY WITH BILATERAL SALPINGO OOPHERECTOMY N/A 08/16/2019   Procedure: XI ROBOTIC ASSISTED TOTAL HYSTERECTOMY WITH BILATERAL SALPINGO OOPHORECTOMY;  Surgeon: Everitt Amber, MD;  Location: [facility];   Service: Gynecology;  Laterality: N/A;  . SHOULDER ARTHROSCOPY Left ~ 2009  . WISDOM TOOTH EXTRACTION     with sedation  . XI ROBOTIC ASSISTED LOWER ANTERIOR RESECTION N/A 08/16/2019   Procedure: XI ROBOTIC ASSISTED LOWER ANTERIOR RESECTION, FLEXIBLE SIGMOIDOSCOPY; ICG;  Surgeon: Ileana Roup, MD;  Location: [facility];  Service: General;  Laterality: N/A;    Past Medical Hx:  Past Medical History:  Diagnosis Date  . ALLERGIC RHINITIS 03/01/2008  . Allergy   . Anxiety 02/25/2012  . Arthritis    left thumb  . ASTHMA    "allergy induced"  . Asthma    seasonal  . ASTHMATIC BRONCHITIS, ACUTE 10/14/2008  . Heart murmur    "maybe" (12/02/2014)  . Hemangioma of liver   . HYPERCHOLESTEROLEMIA 08/21/2007  . HYPOTHYROIDISM   . OTITIS MEDIA, SEROUS, ACUTE, LEFT 03/15/2008  . Ovarian mass   . PONV (postoperative nausea and vomiting)   . SHINGLES 06/07/2008  . Wheezing 10/14/2009    Past Gynecological History:  SVD x 1 No LMP recorded.  Family Hx:  Family History  Problem Relation Age of Onset  . Alcohol abuse Other        ETOH  . Cancer Other        Breast Cancer  . Hypertension Other   . Hyperlipidemia Other   . Diabetes Other   . Breast cancer Maternal Aunt   . Colon polyps Father   . Colon cancer Neg Hx   . Esophageal cancer Neg Hx   . Rectal cancer Neg Hx   . Stomach cancer Neg Hx     Review of Systems:  Constitutional  Feels well,    ENT Normal appearing ears and nares bilaterally Skin/Breast  No rash, sores, jaundice, itching, dryness Cardiovascular  No chest pain, shortness of breath, or edema  Pulmonary  No cough or wheeze.  Gastro Intestinal  No nausea, vomitting, or diarrhoea. No bright red blood per rectum, no abdominal pain, change in bowel movement, or constipation.  Genito Urinary  No frequency, urgency, dysuria, + heavy menstrual bleeding Musculo Skeletal  No myalgia, arthralgia, joint swelling or pain  Neurologic  No weakness, numbness, change  in gait,  Psychology  No depression, anxiety, insomnia.  Vitals:  Blood pressure (!) 115/49, pulse 76, temperature 98.4 F (36.9 C), temperature source Temporal, resp. rate 16, height 5\' 9"  (1.753 m), weight 186 lb 9.6 oz (84.6 kg), SpO2 99 %.  Physical Exam: WD in NAD Neck  Supple NROM, without any enlargements.  Lymph Node Survey No cervical supraclavicular or inguinal adenopathy Cardiovascular  Pulse normal rate, regularity and rhythm. S1 and S2 normal.  Lungs  Clear to auscultation bilateraly, without wheezes/crackles/rhonchi. Good air movement.  Skin  No rash/lesions/breakdown  Psychiatry  Alert and oriented to person, place, and time  Abdomen  Normoactive bowel sounds, abdomen soft, non-tender and nonobese without evidence of hernia.  Back No CVA tenderness Genito Urinary  Vulva/vagina: Normal external female genitalia.  No lesions. No discharge or bleeding.  Bladder/urethra:  No lesions or masses, well supported bladder  Vagina: well healed vaginal cuff Rectal  deferred Extremities  No bilateral cyanosis, clubbing or edema.   Thereasa Solo, MD  09/05/2019, 6:12 PM

## 2019-10-01 ENCOUNTER — Other Ambulatory Visit: Payer: Self-pay

## 2019-10-01 DIAGNOSIS — N838 Other noninflammatory disorders of ovary, fallopian tube and broad ligament: Secondary | ICD-10-CM

## 2019-10-01 MED ORDER — IBUPROFEN 800 MG PO TABS: 800.0000 mg | ORAL_TABLET | Freq: Three times a day (TID) | ORAL | 0 refills | Status: AC | PRN

## 2019-10-05 ENCOUNTER — Other Ambulatory Visit: Payer: Self-pay | Admitting: Internal Medicine

## 2019-10-24 ENCOUNTER — Other Ambulatory Visit: Payer: Self-pay | Admitting: Internal Medicine

## 2019-10-24 NOTE — Telephone Encounter (Signed)
Done erx 

## 2019-10-29 ENCOUNTER — Ambulatory Visit: Payer: 59 | Attending: Internal Medicine

## 2019-10-29 DIAGNOSIS — Z20822 Contact with and (suspected) exposure to covid-19: Secondary | ICD-10-CM

## 2019-10-31 LAB — NOVEL CORONAVIRUS, NAA: SARS-CoV-2, NAA: NOT DETECTED

## 2019-12-14 ENCOUNTER — Ambulatory Visit: Payer: 59

## 2020-03-02 ENCOUNTER — Other Ambulatory Visit: Payer: Self-pay | Admitting: Internal Medicine

## 2020-03-02 NOTE — Telephone Encounter (Signed)
Please refill as per office routine med refill policy (all routine meds refilled for 3 mo or monthly per pt preference up to one year from last visit, then month to month grace period for 3 mo, then further med refills will have to be denied)  

## 2020-03-06 ENCOUNTER — Encounter: Payer: Self-pay | Admitting: Internal Medicine

## 2020-03-06 ENCOUNTER — Ambulatory Visit (INDEPENDENT_AMBULATORY_CARE_PROVIDER_SITE_OTHER): Payer: 59 | Admitting: Internal Medicine

## 2020-03-06 ENCOUNTER — Other Ambulatory Visit: Payer: Self-pay

## 2020-03-06 VITALS — BP 120/88 | HR 81 | Temp 98.5°F | Ht 69.0 in | Wt 191.2 lb

## 2020-03-06 DIAGNOSIS — R739 Hyperglycemia, unspecified: Secondary | ICD-10-CM | POA: Diagnosis not present

## 2020-03-06 DIAGNOSIS — E559 Vitamin D deficiency, unspecified: Secondary | ICD-10-CM | POA: Diagnosis not present

## 2020-03-06 DIAGNOSIS — Z0000 Encounter for general adult medical examination without abnormal findings: Secondary | ICD-10-CM

## 2020-03-06 LAB — CBC WITH DIFFERENTIAL/PLATELET
Basophils Absolute: 0.1 10*3/uL (ref 0.0–0.1)
Basophils Relative: 1.3 % (ref 0.0–3.0)
Eosinophils Absolute: 0.4 10*3/uL (ref 0.0–0.7)
Eosinophils Relative: 7.1 % — ABNORMAL HIGH (ref 0.0–5.0)
HCT: 43.2 % (ref 36.0–46.0)
Hemoglobin: 14.7 g/dL (ref 12.0–15.0)
Lymphocytes Relative: 22.1 % (ref 12.0–46.0)
Lymphs Abs: 1.4 10*3/uL (ref 0.7–4.0)
MCHC: 33.9 g/dL (ref 30.0–36.0)
MCV: 98.4 fl (ref 78.0–100.0)
Monocytes Absolute: 0.5 10*3/uL (ref 0.1–1.0)
Monocytes Relative: 8.6 % (ref 3.0–12.0)
Neutro Abs: 3.8 10*3/uL (ref 1.4–7.7)
Neutrophils Relative %: 60.9 % (ref 43.0–77.0)
Platelets: 294 10*3/uL (ref 150.0–400.0)
RBC: 4.39 Mil/uL (ref 3.87–5.11)
RDW: 12.6 % (ref 11.5–15.5)
WBC: 6.2 10*3/uL (ref 4.0–10.5)

## 2020-03-06 LAB — VITAMIN D 25 HYDROXY (VIT D DEFICIENCY, FRACTURES): VITD: 30.38 ng/mL (ref 30.00–100.00)

## 2020-03-06 LAB — LDL CHOLESTEROL, DIRECT: Direct LDL: 170 mg/dL

## 2020-03-06 LAB — HEPATIC FUNCTION PANEL
ALT: 40 U/L — ABNORMAL HIGH (ref 0–35)
AST: 25 U/L (ref 0–37)
Albumin: 4.5 g/dL (ref 3.5–5.2)
Alkaline Phosphatase: 71 U/L (ref 39–117)
Bilirubin, Direct: 0.1 mg/dL (ref 0.0–0.3)
Total Bilirubin: 0.5 mg/dL (ref 0.2–1.2)
Total Protein: 7.1 g/dL (ref 6.0–8.3)

## 2020-03-06 LAB — BASIC METABOLIC PANEL
BUN: 20 mg/dL (ref 6–23)
CO2: 26 mEq/L (ref 19–32)
Calcium: 9.6 mg/dL (ref 8.4–10.5)
Chloride: 105 mEq/L (ref 96–112)
Creatinine, Ser: 0.82 mg/dL (ref 0.40–1.20)
GFR: 73.34 mL/min (ref >60.00)
Glucose, Bld: 101 mg/dL — ABNORMAL HIGH (ref 70–99)
Potassium: 4.4 mEq/L (ref 3.5–5.1)
Sodium: 137 mEq/L (ref 135–145)

## 2020-03-06 LAB — URINALYSIS, ROUTINE W REFLEX MICROSCOPIC
Bilirubin Urine: NEGATIVE
Hgb urine dipstick: NEGATIVE
Ketones, ur: NEGATIVE
Leukocytes,Ua: NEGATIVE
Nitrite: NEGATIVE
RBC / HPF: NONE SEEN (ref 0–?)
Specific Gravity, Urine: 1.025 (ref 1.000–1.030)
Total Protein, Urine: NEGATIVE
Urine Glucose: NEGATIVE
Urobilinogen, UA: 0.2 (ref 0.0–1.0)
pH: 6 (ref 5.0–8.0)

## 2020-03-06 LAB — LIPID PANEL
Cholesterol: 268 mg/dL — ABNORMAL HIGH (ref 0–200)
HDL: 59.3 mg/dL (ref >39.00)
NonHDL: 208.69
Total CHOL/HDL Ratio: 5
Triglycerides: 226 mg/dL — ABNORMAL HIGH (ref 0.0–149.0)
VLDL: 45.2 mg/dL — ABNORMAL HIGH (ref 0.0–40.0)

## 2020-03-06 LAB — HEMOGLOBIN A1C: Hgb A1c MFr Bld: 5.5 % (ref 4.6–6.5)

## 2020-03-06 NOTE — Assessment & Plan Note (Signed)

## 2020-03-06 NOTE — Assessment & Plan Note (Signed)
For f/u lab, cont oral replacement 

## 2020-03-06 NOTE — Assessment & Plan Note (Signed)
stable overall by history and exam, recent data reviewed with pt, and pt to continue medical treatment as before,  to f/u any worsening symptoms or concerns  

## 2020-03-06 NOTE — Patient Instructions (Signed)

## 2020-03-06 NOTE — Progress Notes (Addendum)
Subjective:    Patient ID: Stephanie Schwartz, female    DOB: [birth year], 54 y.o.   MRN: [id number]  HPI  Here for wellness and f/u;  Overall doing ok;  Pt denies Chest pain, worsening SOB, DOE, wheezing, orthopnea, PND, worsening LE edema, palpitations, dizziness or syncope.  Pt denies neurological change such as new headache, facial or extremity weakness.  Pt denies polydipsia, polyuria, or low sugar symptoms. Pt states overall good compliance with treatment and medications, good tolerability, and has been trying to follow appropriate diet.  Pt denies worsening depressive symptoms, suicidal ideation or panic. No fever, night sweats, wt loss, loss of appetite, or other constitutional symptoms.  Pt states good ability with ADL's, has low fall risk, home safety reviewed and adequate, no other significant changes in hearing or vision, and only occasionally active with exercise. No new complaints Past Medical History:  Diagnosis Date  . ALLERGIC RHINITIS 03/01/2008  . Allergy   . Anxiety 02/25/2012  . Arthritis    left thumb  . ASTHMA    "allergy induced"  . Asthma    seasonal  . ASTHMATIC BRONCHITIS, ACUTE 10/14/2008  . Heart murmur    "maybe" (12/02/2014)  . Hemangioma of liver   . HYPERCHOLESTEROLEMIA 08/21/2007  . HYPOTHYROIDISM   . OTITIS MEDIA, SEROUS, ACUTE, LEFT 03/15/2008  . Ovarian mass   . PONV (postoperative nausea and vomiting)   . SHINGLES 06/07/2008  . Wheezing 10/14/2009   Past Surgical History:  Procedure Laterality Date  . COLONOSCOPY  05/2019  . cyst removed from neck area     age 71  . LAPAROSCOPIC APPENDECTOMY N/A 12/02/2014   Procedure: APPENDECTOMY LAPAROSCOPIC;  Surgeon: Ralene Ok, MD;  Location: [facility];  Service: General;  Laterality: N/A;  . REDUCTION MAMMAPLASTY Bilateral ~ 1995  . ROBOTIC ASSISTED TOTAL HYSTERECTOMY WITH BILATERAL SALPINGO OOPHERECTOMY N/A 08/16/2019   Procedure: XI ROBOTIC ASSISTED TOTAL HYSTERECTOMY WITH BILATERAL SALPINGO OOPHORECTOMY;   Surgeon: Everitt Amber, MD;  Location: [facility];  Service: Gynecology;  Laterality: N/A;  . SHOULDER ARTHROSCOPY Left ~ 2009  . WISDOM TOOTH EXTRACTION     with sedation  . XI ROBOTIC ASSISTED LOWER ANTERIOR RESECTION N/A 08/16/2019   Procedure: XI ROBOTIC ASSISTED LOWER ANTERIOR RESECTION, FLEXIBLE SIGMOIDOSCOPY; ICG;  Surgeon: Ileana Roup, MD;  Location: [facility];  Service: General;  Laterality: N/A;    reports that she has never smoked. She has never used smokeless tobacco. She reports current alcohol use of about 14.0 standard drinks of alcohol per week. She reports that she does not use drugs. family history includes Alcohol abuse in an other family member; Breast cancer in her maternal aunt; Cancer in an other family member; Colon polyps in her father; Diabetes in an other family member; Hyperlipidemia in an other family member; Hypertension in an other family member; Prostate cancer in her father. No Known Allergies Current Outpatient Medications on File Prior to Visit  Medication Sig Dispense Refill  . albuterol (VENTOLIN HFA) 108 (90 Base) MCG/ACT inhaler Inhale 2 puffs into the lungs every 6 (six) hours as needed for wheezing or shortness of breath. 18 g 5  . ALPRAZolam (XANAX) 0.5 MG tablet TAKE ONE TABLET BY MOUTH TWICE A DAY AS NEEDED FOR ANXIETY 180 tablet 1  . escitalopram (LEXAPRO) 10 MG tablet Take 1 tablet (10 mg total) by mouth daily. Then stop 90 tablet 1  . estrogens, conjugated, (PREMARIN) 0.625 MG tablet Take 1 tablet (0.625 mg total) by mouth daily. Take  daily for 21 days then do not take for 7 days. (Patient taking differently: Take 0.625 mg by mouth 3 (three) times a week. Take daily for 21 days then do not take for 7 days.) 30 tablet 11  . ibuprofen (ADVIL) 800 MG tablet Take 1 tablet (800 mg total) by mouth every 8 (eight) hours as needed for moderate pain. For AFTER surgery only 30 tablet 0  . levothyroxine (SYNTHROID) 200 MCG tablet Take 200 mcg by mouth daily before  breakfast.     . montelukast (SINGULAIR) 10 MG tablet TAKE ONE TABLET BY MOUTH DAILY 30 tablet 3  . SYMBICORT 160-4.5 MCG/ACT inhaler      No current facility-administered medications on file prior to visit.   Review of Systems All otherwise neg per pt     Objective:   Physical Exam BP 120/88 (BP Location: Left Arm, Patient Position: Sitting, Cuff Size: Large)   Pulse 81   Temp 98.5 F (36.9 C) (Oral)   Ht 5\' 9"  (1.753 m)   Wt 191 lb 4 oz (86.8 kg)   SpO2 96%   BMI 28.24 kg/m  VS noted,  Constitutional: Pt appears in NAD HENT: Head: NCAT.  Right Ear: External ear normal.  Left Ear: External ear normal.  Eyes: . Pupils are equal, round, and reactive to light. Conjunctivae and EOM are normal Nose: without d/c or deformity Neck: Neck supple. Gross normal ROM Cardiovascular: Normal rate and regular rhythm.   Pulmonary/Chest: Effort normal and breath sounds without rales or wheezing.  Abd:  Soft, NT, ND, + BS, no organomegaly Neurological: Pt is alert. At baseline orientation, motor grossly intact Skin: Skin is warm. No rashes, other new lesions, no LE edema Psychiatric: Pt behavior is normal without agitation  All otherwise neg per pt Lab Results  Component Value Date   WBC 8.1 08/17/2019   HGB 11.4 (L) 08/17/2019   HCT 35.6 (L) 08/17/2019   PLT 306 08/17/2019   GLUCOSE 98 08/17/2019   CHOL 213 (H) 03/01/2018   TRIG 184.0 (H) 03/01/2018   HDL 54.00 03/01/2018   LDLDIRECT 133.0 07/03/2015   LDLCALC 123 (H) 03/01/2018   ALT 42 08/10/2019   AST 31 08/10/2019   NA 138 08/17/2019   K 4.1 08/17/2019   CL 108 08/17/2019   CREATININE 0.82 08/17/2019   BUN 10 08/17/2019   CO2 23 08/17/2019   TSH 0.06 (L) 03/01/2018   INR 0.9 08/10/2019   HGBA1C 5.2 08/10/2019      Assessment & Plan:

## 2020-04-18 DIAGNOSIS — M7122 Synovial cyst of popliteal space [Baker], left knee: Secondary | ICD-10-CM | POA: Insufficient documentation

## 2020-05-09 ENCOUNTER — Other Ambulatory Visit: Payer: Self-pay | Admitting: Internal Medicine

## 2020-05-09 NOTE — Telephone Encounter (Signed)
Done erx 

## 2020-05-12 ENCOUNTER — Other Ambulatory Visit: Payer: Self-pay | Admitting: Internal Medicine

## 2020-07-06 ENCOUNTER — Other Ambulatory Visit: Payer: Self-pay | Admitting: Internal Medicine

## 2020-07-06 NOTE — Telephone Encounter (Signed)
Please refill as per office routine med refill policy (all routine meds refilled for 3 mo or monthly per pt preference up to one year from last visit, then month to month grace period for 3 mo, then further med refills will have to be denied)  

## 2020-08-07 ENCOUNTER — Other Ambulatory Visit: Payer: Self-pay | Admitting: Internal Medicine

## 2020-09-16 ENCOUNTER — Encounter: Payer: Self-pay | Admitting: Internal Medicine

## 2020-09-16 ENCOUNTER — Ambulatory Visit: Payer: 59 | Admitting: Internal Medicine

## 2020-09-16 ENCOUNTER — Other Ambulatory Visit: Payer: Self-pay

## 2020-09-16 VITALS — BP 118/80 | HR 72 | Temp 97.9°F | Ht 69.0 in | Wt 195.0 lb

## 2020-09-16 DIAGNOSIS — R739 Hyperglycemia, unspecified: Secondary | ICD-10-CM | POA: Diagnosis not present

## 2020-09-16 DIAGNOSIS — Z1379 Encounter for other screening for genetic and chromosomal anomalies: Secondary | ICD-10-CM | POA: Diagnosis not present

## 2020-09-16 DIAGNOSIS — Z1159 Encounter for screening for other viral diseases: Secondary | ICD-10-CM | POA: Diagnosis not present

## 2020-09-16 DIAGNOSIS — F419 Anxiety disorder, unspecified: Secondary | ICD-10-CM

## 2020-09-16 NOTE — Progress Notes (Signed)
Subjective:    Patient ID: Stephanie Schwartz, female    DOB: Jun 11, 1966, 54 y.o.   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  She wants to explain to me that this kind of screening could be considered for all patients and should be done at earlier age, if the pcp's would just adopt this.  She is wanting me to order the testing (has the kit with her), and the results to be faxed to me, and if abnormal, pt will make herself available to genetic counseling provided by her company.  .  Also  Pt denies polydipsia, polyuria. Denies worsening depressive symptoms, suicidal ideation, or panic Past Medical History:  Diagnosis Date  . ALLERGIC RHINITIS 03/01/2008  . Allergy   . Anxiety 02/25/2012  . Arthritis    left thumb  . ASTHMA    "allergy induced"  . Asthma    seasonal  . ASTHMATIC BRONCHITIS, ACUTE 10/14/2008  . Heart murmur    "maybe" (12/02/2014)  . Hemangioma of liver   . HYPERCHOLESTEROLEMIA 08/21/2007  . HYPOTHYROIDISM   . OTITIS MEDIA, SEROUS, ACUTE, LEFT 03/15/2008  . Ovarian mass   . PONV (postoperative nausea and vomiting)   . SHINGLES 06/07/2008  . Wheezing 10/14/2009   Past Surgical History:  Procedure Laterality Date  . COLONOSCOPY  05/2019  . cyst removed from neck area     age 89  . LAPAROSCOPIC APPENDECTOMY N/A 12/02/2014   Procedure: APPENDECTOMY LAPAROSCOPIC;  Surgeon: Ralene Ok, MD;  Location: [facility];  Service: General;  Laterality: N/A;  . REDUCTION MAMMAPLASTY Bilateral ~ 1995  . ROBOTIC ASSISTED TOTAL HYSTERECTOMY WITH BILATERAL SALPINGO OOPHERECTOMY N/A 08/16/2019   Procedure: XI ROBOTIC ASSISTED TOTAL HYSTERECTOMY WITH BILATERAL SALPINGO OOPHORECTOMY;  Surgeon: Everitt Amber,  MD;  Location: [facility];  Service: Gynecology;  Laterality: N/A;  . SHOULDER ARTHROSCOPY Left ~ 2009  . WISDOM TOOTH EXTRACTION     with sedation  . XI ROBOTIC ASSISTED LOWER ANTERIOR RESECTION N/A 08/16/2019   Procedure: XI ROBOTIC ASSISTED LOWER ANTERIOR RESECTION, FLEXIBLE SIGMOIDOSCOPY; ICG;  Surgeon: Ileana Roup, MD;  Location: [facility];  Service: General;  Laterality: N/A;    reports that she has never smoked. She has never used smokeless tobacco. She reports current alcohol use of about 14.0 standard drinks of alcohol per week. She reports that she does not use drugs. family history includes Alcohol abuse in an other family member; Breast cancer in her maternal aunt; Cancer in an other family member; Colon polyps in her father; Diabetes in an other family member; Hyperlipidemia in an other family member; Hypertension in an other family member; Prostate cancer in her father. No Known Allergies Current Outpatient Medications on File Prior to Visit  Medication Sig Dispense Refill  . albuterol (VENTOLIN HFA) 108 (90 Base) MCG/ACT inhaler Inhale 2 puffs into the lungs every 6 (six) hours as needed for wheezing or shortness of breath. 18 g 5  . ALPRAZolam (XANAX) 0.5 MG tablet TAKE ONE TABLET BY MOUTH TWICE A DAY AS NEEDED FOR ANXIETY 60 tablet 5  . escitalopram (LEXAPRO) 10 MG tablet TAKE ONE TABLET BY MOUTH DAILY THEN STOP AS DIRECTED 90 tablet 2  . estrogens,  conjugated, (PREMARIN) 0.625 MG tablet Take 1 tablet (0.625 mg total) by mouth daily. Take daily for 21 days then do not take for 7 days. (Patient taking differently: Take 0.625 mg by mouth 3 (three) times a week. Take daily for 21 days then do not take for 7 days.) 30 tablet 11  . ibuprofen (ADVIL) 800 MG tablet Take 1 tablet (800 mg total) by mouth every 8 (eight) hours as needed for moderate pain. For AFTER surgery only 30 tablet 0  . levothyroxine (SYNTHROID) 175 MCG tablet Take by mouth.    . montelukast (SINGULAIR) 10 MG tablet  TAKE ONE TABLET BY MOUTH DAILY 30 tablet 3  . SYMBICORT 160-4.5 MCG/ACT inhaler INHALE TWO PUFFS BY MOUTH TWICE A DAY 10.2 g 1   No current facility-administered medications on file prior to visit.   Review of Systems All otherwise neg per pt    Objective:   Physical Exam BP 118/80 (BP Location: Left Arm, Patient Position: Sitting, Cuff Size: Large)   Pulse 72   Temp 97.9 F (36.6 C) (Oral)   Ht 5' 9"  (1.753 m)   Wt 195 lb (88.5 kg)   SpO2 97%   BMI 28.80 kg/m  VS noted,  Constitutional: Pt appears in NAD HENT: Head: NCAT.  Right Ear: External ear normal.  Left Ear: External ear normal.  Eyes: . Pupils are equal, round, and reactive to light. Conjunctivae and EOM are normal Nose: without d/c or deformity Neck: Neck supple. Gross normal ROM Cardiovascular: Normal rate and regular rhythm.   Pulmonary/Chest: Effort normal and breath sounds without rales or wheezing.  Neurological: Pt is alert. At baseline orientation, motor grossly intact Skin: Skin is warm. No rashes, other new lesions, no LE edema Psychiatric: Pt behavior is normal without agitation  All otherwise neg per pt Lab Results  Component Value Date   WBC 6.2 03/06/2020   HGB 14.7 03/06/2020   HCT 43.2 03/06/2020   PLT 294.0 03/06/2020   GLUCOSE 101 (H) 03/06/2020   CHOL 268 (H) 03/06/2020   TRIG 226.0 (H) 03/06/2020   HDL 59.30 03/06/2020   LDLDIRECT 170.0 03/06/2020   LDLCALC 123 (H) 03/01/2018   ALT 40 (H) 03/06/2020   AST 25 03/06/2020   NA 137 03/06/2020   K 4.4 03/06/2020   CL 105 03/06/2020   CREATININE 0.82 03/06/2020   BUN 20 03/06/2020   CO2 26 03/06/2020   TSH 0.06 (L) 03/01/2018   INR 0.9 08/10/2019   HGBA1C 5.5 03/06/2020      Assessment & Plan:

## 2020-09-16 NOTE — Patient Instructions (Addendum)
Please continue all other medications as before, and refills have been done if requested.  Please have the pharmacy call with any other refills you may need.  Please continue your efforts at being more active, low cholesterol diet, and weight control..  Please keep your appointments with your specialists as you may have planned  The fax here is [phone]  Please go to the LAB at the blood drawing area for the tests to be done  You will be contacted by phone if any changes need to be made immediately.  Otherwise, you will receive a letter about your results with an explanation, but please check with MyChart first.  Please remember to sign up for MyChart if you have not done so, as this will be important to you in the future with finding out test results, communicating by private email, and scheduling acute appointments online when needed.

## 2020-09-17 LAB — HEPATITIS C ANTIBODY
Hepatitis C Ab: NONREACTIVE
SIGNAL TO CUT-OFF: 0.01 (ref <1.00)

## 2020-09-21 ENCOUNTER — Encounter: Payer: Self-pay | Admitting: Internal Medicine

## 2020-09-21 DIAGNOSIS — Z1379 Encounter for other screening for genetic and chromosomal anomalies: Secondary | ICD-10-CM | POA: Insufficient documentation

## 2020-09-21 NOTE — Assessment & Plan Note (Signed)
stable overall by history and exam, recent data reviewed with pt, and pt to continue medical treatment as before,  to f/u any worsening symptoms or concerns  

## 2020-09-21 NOTE — Assessment & Plan Note (Signed)
Ok for testing as per pt request, though not clear if can or should be applied to all primary care patients

## 2020-09-30 ENCOUNTER — Telehealth: Payer: Self-pay | Admitting: Internal Medicine

## 2020-09-30 NOTE — Telephone Encounter (Signed)
    Please call patient to discuss lab results 

## 2020-10-01 NOTE — Telephone Encounter (Signed)
Patient calling back to get someone to give her a call back about her lab results.

## 2020-10-01 NOTE — Telephone Encounter (Signed)
° ° °  Please return call to patient °

## 2020-10-01 NOTE — Telephone Encounter (Signed)
°  Returned patient call and left a voicemail for patient to call back to discuss her labs.

## 2020-10-01 NOTE — Telephone Encounter (Signed)
Results given.

## 2020-10-08 NOTE — Telephone Encounter (Signed)
Patient called and was wondering if someone could give her a call in regards to her hereditary cancer panel lab results. She can be reached at [phone]

## 2020-10-09 NOTE — Telephone Encounter (Signed)
Spoke with pt to inform her that her test were negative. She will be by at some point this week to pick up a copy of her results.

## 2020-11-14 ENCOUNTER — Telehealth: Payer: Self-pay

## 2020-11-14 DIAGNOSIS — N809 Endometriosis, unspecified: Secondary | ICD-10-CM

## 2020-11-14 MED ORDER — ESTROGENS CONJUGATED 0.625 MG PO TABS: 0.6250 mg | ORAL_TABLET | Freq: Every day | ORAL | 11 refills | Status: AC

## 2020-11-14 NOTE — Telephone Encounter (Signed)
Faxed completed  paper refill request to [facility] at [phone].

## 2020-11-17 ENCOUNTER — Other Ambulatory Visit: Payer: Self-pay | Admitting: Internal Medicine

## 2020-11-17 NOTE — Telephone Encounter (Signed)
Please refill as per office routine med refill policy (all routine meds refilled for 3 mo or monthly per pt preference up to one year from last visit, then month to month grace period for 3 mo, then further med refills will have to be denied)  

## 2020-11-18 ENCOUNTER — Other Ambulatory Visit: Payer: Self-pay

## 2020-11-18 MED ORDER — BUDESONIDE-FORMOTEROL FUMARATE 160-4.5 MCG/ACT IN AERO
INHALATION_SPRAY | RESPIRATORY_TRACT | 0 refills | Status: DC
Start: 2020-11-18 — End: 2020-11-25

## 2020-11-18 MED ORDER — MONTELUKAST SODIUM 10 MG PO TABS
10.0000 mg | ORAL_TABLET | Freq: Every day | ORAL | 3 refills | Status: DC
Start: 2020-11-18 — End: 2020-11-19

## 2020-11-18 MED ORDER — ESCITALOPRAM OXALATE 10 MG PO TABS
ORAL_TABLET | ORAL | 0 refills | Status: DC
Start: 2020-11-18 — End: 2021-01-06

## 2020-11-19 ENCOUNTER — Other Ambulatory Visit: Payer: Self-pay

## 2020-11-19 MED ORDER — MONTELUKAST SODIUM 10 MG PO TABS
10.0000 mg | ORAL_TABLET | Freq: Every day | ORAL | 3 refills | Status: DC
Start: 2020-11-19 — End: 2021-01-23

## 2020-11-21 ENCOUNTER — Telehealth: Payer: Self-pay | Admitting: Internal Medicine

## 2020-11-21 NOTE — Telephone Encounter (Signed)
Ok to call pt - we received a refill request for xanax ODT 0.5 bid prn from [facility]  I dont see where the ODT medication has been rx before, and not through [facility] since we dont normally use the mail in pharmacy for controlled substances due to the large number of pills involved  I dont feel comfortable with this request,  If pt needs anything else, let us know

## 2020-11-21 NOTE — Telephone Encounter (Signed)
Patient notified and understood,  Signed        Ok to call pt - we received a refill request for xanax ODT 0.5 bid prn from [facility]  I dont see where the ODT medication has been rx before, and not through [facility] since we dont normally use the mail in pharmacy for controlled substances due to the large number of pills involved  I dont feel comfortable with this request,  If pt needs anything else, let us know

## 2020-11-24 ENCOUNTER — Other Ambulatory Visit: Payer: Self-pay | Admitting: Internal Medicine

## 2020-11-24 NOTE — Telephone Encounter (Signed)
Please refill as per office routine med refill policy (all routine meds refilled for 3 mo or monthly per pt preference up to one year from last visit, then month to month grace period for 3 mo, then further med refills will have to be denied)  

## 2020-12-02 ENCOUNTER — Other Ambulatory Visit: Payer: Self-pay | Admitting: Internal Medicine

## 2020-12-15 ENCOUNTER — Telehealth: Payer: Self-pay | Admitting: Internal Medicine

## 2020-12-15 MED ORDER — VALACYCLOVIR HCL 1 G PO TABS
1000.0000 mg | ORAL_TABLET | Freq: Two times a day (BID) | ORAL | 0 refills | Status: DC
Start: 2020-12-15 — End: 2020-12-19

## 2020-12-15 NOTE — Telephone Encounter (Signed)
Ok done for valtrex

## 2020-12-15 NOTE — Addendum Note (Signed)
Addended by: Biagio Borg on: 12/15/2020 05:55 PM   Modules accepted: Orders

## 2020-12-15 NOTE — Telephone Encounter (Signed)
Patient called and was wondering if something for fever blisters could be called in. She said that it can be sent to [facility] [address], [place]. Her last OV was 11.16.21, next OV is 5.9.22

## 2020-12-16 NOTE — Telephone Encounter (Signed)
LDVM instructing pt that med has been called in to the pharmacy she requested; may call office if additional ques/concerns.

## 2020-12-19 ENCOUNTER — Other Ambulatory Visit: Payer: Self-pay | Admitting: Internal Medicine

## 2020-12-22 ENCOUNTER — Ambulatory Visit: Payer: 59 | Admitting: Internal Medicine

## 2021-01-05 ENCOUNTER — Other Ambulatory Visit: Payer: Self-pay | Admitting: Internal Medicine

## 2021-01-22 ENCOUNTER — Other Ambulatory Visit: Payer: Self-pay | Admitting: Internal Medicine

## 2021-01-23 NOTE — Telephone Encounter (Signed)
Please refill as per office routine med refill policy (all routine meds refilled for 3 mo or monthly per pt preference up to one year from last visit, then month to month grace period for 3 mo, then further med refills will have to be denied)  

## 2021-02-24 ENCOUNTER — Telehealth: Payer: Self-pay | Admitting: Internal Medicine

## 2021-02-24 MED ORDER — SCOPOLAMINE 1 MG/3DAYS TD PT72: 1.0000 | MEDICATED_PATCH | TRANSDERMAL | 0 refills | Status: DC

## 2021-02-24 NOTE — Telephone Encounter (Signed)
Ok this is done 

## 2021-02-24 NOTE — Telephone Encounter (Signed)
Patient called and was wondering if a patch for motion sickness could be called into [facility] [address], [place]. She can be reached at [phone]. Please advise

## 2021-03-04 ENCOUNTER — Other Ambulatory Visit (INDEPENDENT_AMBULATORY_CARE_PROVIDER_SITE_OTHER): Payer: 59

## 2021-03-04 ENCOUNTER — Other Ambulatory Visit: Payer: Self-pay

## 2021-03-04 DIAGNOSIS — Z0000 Encounter for general adult medical examination without abnormal findings: Secondary | ICD-10-CM

## 2021-03-04 DIAGNOSIS — E559 Vitamin D deficiency, unspecified: Secondary | ICD-10-CM

## 2021-03-04 DIAGNOSIS — R739 Hyperglycemia, unspecified: Secondary | ICD-10-CM | POA: Diagnosis not present

## 2021-03-04 LAB — BASIC METABOLIC PANEL
BUN: 18 mg/dL (ref 6–23)
CO2: 27 mEq/L (ref 19–32)
Calcium: 9.1 mg/dL (ref 8.4–10.5)
Chloride: 106 mEq/L (ref 96–112)
Creatinine, Ser: 0.96 mg/dL (ref 0.40–1.20)
GFR: 68.02 mL/min (ref >60.00)
Glucose, Bld: 88 mg/dL (ref 70–99)
Potassium: 4.2 mEq/L (ref 3.5–5.1)
Sodium: 139 mEq/L (ref 135–145)

## 2021-03-04 LAB — URINALYSIS, ROUTINE W REFLEX MICROSCOPIC
Bilirubin Urine: NEGATIVE
Hgb urine dipstick: NEGATIVE
Ketones, ur: NEGATIVE
Leukocytes,Ua: NEGATIVE
Nitrite: NEGATIVE
Specific Gravity, Urine: 1.015 (ref 1.000–1.030)
Total Protein, Urine: NEGATIVE
Urine Glucose: NEGATIVE
Urobilinogen, UA: 0.2 (ref 0.0–1.0)
pH: 8 (ref 5.0–8.0)

## 2021-03-04 LAB — CBC WITH DIFFERENTIAL/PLATELET
Basophils Absolute: 0.1 10*3/uL (ref 0.0–0.1)
Basophils Relative: 1.3 % (ref 0.0–3.0)
Eosinophils Absolute: 0.1 10*3/uL (ref 0.0–0.7)
Eosinophils Relative: 3.2 % (ref 0.0–5.0)
HCT: 40.1 % (ref 36.0–46.0)
Hemoglobin: 13.6 g/dL (ref 12.0–15.0)
Lymphocytes Relative: 26.3 % (ref 12.0–46.0)
Lymphs Abs: 1 10*3/uL (ref 0.7–4.0)
MCHC: 33.8 g/dL (ref 30.0–36.0)
MCV: 96.2 fl (ref 78.0–100.0)
Monocytes Absolute: 0.4 10*3/uL (ref 0.1–1.0)
Monocytes Relative: 9.7 % (ref 3.0–12.0)
Neutro Abs: 2.3 10*3/uL (ref 1.4–7.7)
Neutrophils Relative %: 59.5 % (ref 43.0–77.0)
Platelets: 241 10*3/uL (ref 150.0–400.0)
RBC: 4.17 Mil/uL (ref 3.87–5.11)
RDW: 12 % (ref 11.5–15.5)
WBC: 3.8 10*3/uL — ABNORMAL LOW (ref 4.0–10.5)

## 2021-03-04 LAB — HEPATIC FUNCTION PANEL
ALT: 47 U/L — ABNORMAL HIGH (ref 0–35)
AST: 26 U/L (ref 0–37)
Albumin: 4.1 g/dL (ref 3.5–5.2)
Alkaline Phosphatase: 67 U/L (ref 39–117)
Bilirubin, Direct: 0.1 mg/dL (ref 0.0–0.3)
Total Bilirubin: 0.5 mg/dL (ref 0.2–1.2)
Total Protein: 6.5 g/dL (ref 6.0–8.3)

## 2021-03-04 LAB — VITAMIN D 25 HYDROXY (VIT D DEFICIENCY, FRACTURES): VITD: 25.4 ng/mL — ABNORMAL LOW (ref 30.00–100.00)

## 2021-03-04 LAB — LIPID PANEL
Cholesterol: 244 mg/dL — ABNORMAL HIGH (ref 0–200)
HDL: 51.4 mg/dL (ref >39.00)
LDL Cholesterol: 169 mg/dL — ABNORMAL HIGH (ref 0–99)
NonHDL: 192.27
Total CHOL/HDL Ratio: 5
Triglycerides: 114 mg/dL (ref 0.0–149.0)
VLDL: 22.8 mg/dL (ref 0.0–40.0)

## 2021-03-04 LAB — HEMOGLOBIN A1C: Hgb A1c MFr Bld: 5.6 % (ref 4.6–6.5)

## 2021-03-04 LAB — TSH: TSH: 0.64 u[IU]/mL (ref 0.35–4.50)

## 2021-03-09 ENCOUNTER — Encounter: Payer: 59 | Admitting: Internal Medicine

## 2021-03-25 ENCOUNTER — Other Ambulatory Visit: Payer: Self-pay

## 2021-03-25 ENCOUNTER — Ambulatory Visit (INDEPENDENT_AMBULATORY_CARE_PROVIDER_SITE_OTHER): Payer: 59 | Admitting: Internal Medicine

## 2021-03-25 ENCOUNTER — Encounter: Payer: Self-pay | Admitting: Internal Medicine

## 2021-03-25 VITALS — BP 126/80 | HR 63 | Temp 98.9°F | Ht 69.0 in | Wt 191.0 lb

## 2021-03-25 DIAGNOSIS — Z0001 Encounter for general adult medical examination with abnormal findings: Secondary | ICD-10-CM

## 2021-03-25 DIAGNOSIS — E7800 Pure hypercholesterolemia, unspecified: Secondary | ICD-10-CM

## 2021-03-25 DIAGNOSIS — E559 Vitamin D deficiency, unspecified: Secondary | ICD-10-CM | POA: Diagnosis not present

## 2021-03-25 DIAGNOSIS — R739 Hyperglycemia, unspecified: Secondary | ICD-10-CM | POA: Diagnosis not present

## 2021-03-25 DIAGNOSIS — E039 Hypothyroidism, unspecified: Secondary | ICD-10-CM

## 2021-03-25 MED ORDER — THERA-D 2000 50 MCG (2000 UT) PO TABS: ORAL_TABLET | ORAL | 99 refills | Status: DC

## 2021-03-25 MED ORDER — PANTOPRAZOLE SODIUM 40 MG PO TBEC: 40.0000 mg | DELAYED_RELEASE_TABLET | Freq: Every day | ORAL | 3 refills | Status: DC

## 2021-03-25 NOTE — Patient Instructions (Addendum)
Please take OTC Vitamin D3 at 2000 units per day, indefinitely  Please take all new medication as prescribed - the protonix for acid reflux  We have discussed the Cardiac CT Score test to measure the calcification level (if any) in your heart arteries.  This test has been ordered in our [place], so please call [facility] directly, as they prefer this, at [phone] to be scheduled.  Please continue all other medications as before, and refills have been done if requested.  Please have the pharmacy call with any other refills you may need.  Please continue your efforts at being more active, low cholesterol diet, and weight control.  You are otherwise up to date with prevention measures today.  Please keep your appointments with your specialists as you may have planned  Please make an Appointment to return for your 1 year visit, or sooner if needed, with Lab testing by Appointment as well, to be done about 3-5 days before at the [facility] (so this is for TWO appointments - please see the scheduling desk as you leave)  Due to the ongoing Covid 19 pandemic, our lab now requires an appointment for any labs done at our office.  If you need labs done and do not have an appointment, please call our office ahead of time to schedule before presenting to the lab for your testing.

## 2021-03-25 NOTE — Assessment & Plan Note (Signed)
Last vitamin D Lab Results  Component Value Date   VD25OH 25.40 (L) 03/04/2021   Low, to start oral replacement

## 2021-03-25 NOTE — Assessment & Plan Note (Signed)

## 2021-03-25 NOTE — Assessment & Plan Note (Signed)
Lab Results  Component Value Date   TSH 0.64 03/04/2021   Stable, pt to continue levothyroxine

## 2021-03-25 NOTE — Assessment & Plan Note (Signed)
Lab Results  Component Value Date   HGBA1C 5.6 03/04/2021   Stable, pt to continue current medical treatment  - diet

## 2021-03-25 NOTE — Assessment & Plan Note (Signed)
Lab Results  Component Value Date   LDLCALC 169 (H) 03/04/2021   Stable, pt to continue current low chol diet, and check card Ct Score, consider statin if abnormal

## 2021-03-25 NOTE — Progress Notes (Signed)
Patient ID: CARIANA Schwartz, Schwartz   DOB: February 14, 1966, 55 y.o.   MRN: [id number]         Chief Complaint:: wellness exam and Annual Exam  , low vit d, hld, gerd       HPI:  AASHA Schwartz is a 55 y.o. Schwartz here for wellness exam; up to date with preventive referrals and immunizations                        Also lost 6 lb since feb 2022 with contrave, despite this has had mild worsening reflux, but no abd pain, dysphagia, n/v, bowel change or blood.  Not taking vit d.  Not willing to start statin, but interested in Card Ct Score.  Pt denies chest pain, increased sob or doe, wheezing, orthopnea, PND, increased LE swelling, palpitations, dizziness or syncope.   Pt denies polydipsia, polyuria, or new focal neuro s/s.   Pt denies fever, wt loss, night sweats, loss of appetite, or other constitutional symptoms  No other new complaints.  Denies hyper or hypo thyroid symptoms such Stephanie voice, skin or hair change.   Wt Readings from Last 3 Encounters:  03/25/21 191 lb (86.6 kg)  09/16/20 195 lb (88.5 kg)  03/06/20 191 lb 4 oz (86.8 kg)   BP Readings from Last 3 Encounters:  03/25/21 126/80  09/16/20 118/80  03/06/20 120/88   Immunization History  Administered Date(s) Administered  . Influenza Whole 12/17/2010  . Influenza,inj,Quad PF,6+ Mos 12/03/2014, 08/03/2016  . PFIZER(Purple Top)SARS-COV-2 Vaccination 01/09/2020, 02/06/2020, 09/09/2020  . Td 02/03/2009  . Tdap 05/21/2012   There are no preventive care reminders to display for this patient.    Past Medical History:  Diagnosis Date  . ALLERGIC RHINITIS 03/01/2008  . Allergy   . Anxiety 02/25/2012  . Arthritis    left thumb  . ASTHMA    "allergy induced"  . Asthma    seasonal  . ASTHMATIC BRONCHITIS, ACUTE 10/14/2008  . Heart murmur    "maybe" (12/02/2014)  . Hemangioma of liver   . HYPERCHOLESTEROLEMIA 08/21/2007  . HYPOTHYROIDISM   . OTITIS MEDIA, SEROUS, ACUTE, LEFT 03/15/2008  . Ovarian mass   . PONV (postoperative nausea  and vomiting)   . SHINGLES 06/07/2008  . Wheezing 10/14/2009   Past Surgical History:  Procedure Laterality Date  . COLONOSCOPY  05/2019  . cyst removed from neck area     age 42  . LAPAROSCOPIC APPENDECTOMY N/A 12/02/2014   Procedure: APPENDECTOMY LAPAROSCOPIC;  Surgeon: Ralene Ok, MD;  Location: [facility];  Service: General;  Laterality: N/A;  . REDUCTION MAMMAPLASTY Bilateral ~ 1995  . ROBOTIC ASSISTED TOTAL HYSTERECTOMY WITH BILATERAL SALPINGO OOPHERECTOMY N/A 08/16/2019   Procedure: XI ROBOTIC ASSISTED TOTAL HYSTERECTOMY WITH BILATERAL SALPINGO OOPHORECTOMY;  Surgeon: Everitt Amber, MD;  Location: [facility];  Service: Gynecology;  Laterality: N/A;  . SHOULDER ARTHROSCOPY Left ~ 2009  . WISDOM TOOTH EXTRACTION     with sedation  . XI ROBOTIC ASSISTED LOWER ANTERIOR RESECTION N/A 08/16/2019   Procedure: XI ROBOTIC ASSISTED LOWER ANTERIOR RESECTION, FLEXIBLE SIGMOIDOSCOPY; ICG;  Surgeon: Ileana Roup, MD;  Location: [facility];  Service: General;  Laterality: N/A;    reports that she has never smoked. She has never used smokeless tobacco. She reports current alcohol use of about 14.0 standard drinks of alcohol per week. She reports that she does not use drugs. family history includes Alcohol abuse in an other family member; Breast cancer in  her maternal aunt; Cancer in an other family member; Colon polyps in her father; Diabetes in an other family member; Hyperlipidemia in an other family member; Hypertension in an other family member; Prostate cancer in her father. No Known Allergies Current Outpatient Medications on File Prior to Visit  Medication Sig Dispense Refill  . albuterol (VENTOLIN HFA) 108 (90 Base) MCG/ACT inhaler Inhale 2 puffs into the lungs every 6 (six) hours Stephanie needed for wheezing or shortness of breath. 18 g 5  . ALPRAZolam (XANAX) 0.5 MG tablet TAKE ONE TABLET BY MOUTH TWICE A DAY Stephanie NEEDED FOR ANXIETY 60 tablet 5  . budesonide-formoterol (SYMBICORT) 160-4.5 MCG/ACT  inhaler USE 2 INHALATIONS BY MOUTH  TWICE DAILY 10.2 g 11  . escitalopram (LEXAPRO) 10 MG tablet TAKE 1 TABLET BY MOUTH  DAILY THEN STOP Stephanie DIRECTED 90 tablet 2  . estrogens, conjugated, (PREMARIN) 0.625 MG tablet Take 1 tablet (0.625 mg total) by mouth daily. Take daily for 21 days then do not take for 7 days. 30 tablet 11  . ibuprofen (ADVIL) 800 MG tablet Take 1 tablet (800 mg total) by mouth every 8 (eight) hours Stephanie needed for moderate pain. For AFTER surgery only 30 tablet 0  . levothyroxine (SYNTHROID) 175 MCG tablet Take by mouth.    . montelukast (SINGULAIR) 10 MG tablet TAKE 1 TABLET BY MOUTH  DAILY 90 tablet 3  . scopolamine (TRANSDERM-SCOP, 1.5 MG,) 1 MG/3DAYS Place 1 patch (1.5 mg total) onto the skin every 3 (three) days. 10 patch 0  . valACYclovir (VALTREX) 1000 MG tablet TAKE ONE TABLET BY MOUTH TWICE A DAY 14 tablet 0   No current facility-administered medications on file prior to visit.        ROS:  All others reviewed and negative.  Objective        PE:  BP 126/80 (BP Location: Right Arm, Patient Position: Sitting, Cuff Size: Large)   Pulse 63   Temp 98.9 F (37.2 C) (Oral)   Ht 5\' 9"  (1.753 m)   Wt 191 lb (86.6 kg)   LMP 07/30/2019 (Exact Date)   SpO2 97%   BMI 28.21 kg/m                 Constitutional: Pt appears in NAD               HENT: Head: NCAT.                Right Ear: External ear normal.                 Left Ear: External ear normal.                Eyes: . Pupils are equal, round, and reactive to light. Conjunctivae and EOM are normal               Nose: without d/c or deformity               Neck: Neck supple. Gross normal ROM               Cardiovascular: Normal rate and regular rhythm.                 Pulmonary/Chest: Effort normal and breath sounds without rales or wheezing.                Abd:  Soft, NT, ND, + BS, no organomegaly  Neurological: Pt is alert. At baseline orientation, motor grossly intact               Skin: Skin is  warm. No rashes, no other new lesions, LE edema - none               Psychiatric: Pt behavior is normal without agitation   Micro: none  Cardiac tracings I have personally interpreted today:  none  Pertinent Radiological findings (summarize): none   Lab Results  Component Value Date   WBC 3.8 (L) 03/04/2021   HGB 13.6 03/04/2021   HCT 40.1 03/04/2021   PLT 241.0 03/04/2021   GLUCOSE 88 03/04/2021   CHOL 244 (H) 03/04/2021   TRIG 114.0 03/04/2021   HDL 51.40 03/04/2021   LDLDIRECT 170.0 03/06/2020   LDLCALC 169 (H) 03/04/2021   ALT 47 (H) 03/04/2021   AST 26 03/04/2021   NA 139 03/04/2021   K 4.2 03/04/2021   CL 106 03/04/2021   CREATININE 0.96 03/04/2021   BUN 18 03/04/2021   CO2 27 03/04/2021   TSH 0.64 03/04/2021   INR 0.9 08/10/2019   HGBA1C 5.6 03/04/2021   Assessment/Plan:  Stephanie Schwartz is a 7 y.o. White or Caucasian [1] Schwartz with  has a past medical history of ALLERGIC RHINITIS (03/01/2008), Allergy, Anxiety (02/25/2012), Arthritis, ASTHMA, Asthma, ASTHMATIC BRONCHITIS, ACUTE (10/14/2008), Heart murmur, Hemangioma of liver, HYPERCHOLESTEROLEMIA (08/21/2007), HYPOTHYROIDISM, OTITIS MEDIA, SEROUS, ACUTE, LEFT (03/15/2008), Ovarian mass, PONV (postoperative nausea and vomiting), SHINGLES (06/07/2008), and Wheezing (10/14/2009).  Encounter for well adult exam with abnormal findings Age and sex appropriate education and counseling updated with regular exercise and diet Referrals for preventative services - none needed Immunizations addressed - none needed Smoking counseling  - none needed Evidence for depression or other mood disorder - none significant Most recent labs reviewed. I have personally reviewed and have noted: 1) the patient's medical and social history 2) The patient's current medications and supplements 3) The patient's height, weight, and BMI have been recorded in the chart   Hypothyroidism Lab Results  Component Value Date   TSH 0.64 03/04/2021    Stable, pt to continue levothyroxine  HYPERCHOLESTEROLEMIA Lab Results  Component Value Date   LDLCALC 169 (H) 03/04/2021   Stable, pt to continue current low chol diet, and check card Ct Score, consider statin if abnormal   Hyperglycemia Lab Results  Component Value Date   HGBA1C 5.6 03/04/2021   Stable, pt to continue current medical treatment  - diet   Vitamin D deficiency Last vitamin D Lab Results  Component Value Date   VD25OH 25.40 (L) 03/04/2021   Low, to start oral replacement   Followup: Return in about 1 year (around 03/25/2022).  Cathlean Cower, MD 03/25/2021 9:36 PM [facility]

## 2021-04-16 ENCOUNTER — Encounter: Payer: Self-pay | Admitting: Internal Medicine

## 2021-04-20 ENCOUNTER — Ambulatory Visit: Payer: 59

## 2021-09-13 ENCOUNTER — Other Ambulatory Visit: Payer: Self-pay | Admitting: Internal Medicine

## 2021-09-28 ENCOUNTER — Telehealth: Payer: 59 | Admitting: Family Medicine

## 2021-09-28 DIAGNOSIS — J40 Bronchitis, not specified as acute or chronic: Secondary | ICD-10-CM

## 2021-09-28 MED ORDER — PREDNISONE 10 MG (21) PO TBPK: ORAL_TABLET | ORAL | 0 refills | Status: DC

## 2021-09-28 MED ORDER — PROMETHAZINE-DM 6.25-15 MG/5ML PO SYRP: 2.5000 mL | ORAL_SOLUTION | Freq: Three times a day (TID) | ORAL | 0 refills | Status: DC | PRN

## 2021-09-28 MED ORDER — ALBUTEROL SULFATE HFA 108 (90 BASE) MCG/ACT IN AERS: 2.0000 | INHALATION_SPRAY | Freq: Four times a day (QID) | RESPIRATORY_TRACT | 0 refills | Status: DC | PRN

## 2021-09-28 MED ORDER — BENZONATATE 100 MG PO CAPS: 100.0000 mg | ORAL_CAPSULE | Freq: Two times a day (BID) | ORAL | 0 refills | Status: DC | PRN

## 2021-09-28 MED ORDER — FLUTICASONE PROPIONATE 50 MCG/ACT NA SUSP: 2.0000 | Freq: Every day | NASAL | 0 refills | Status: AC

## 2021-09-28 NOTE — Progress Notes (Signed)
Virtual Visit Consent   Stephanie Schwartz, you are scheduled for a virtual visit with a [facility] provider today.     Just as with appointments in the office, your consent must be obtained to participate.  Your consent will be active for this visit and any virtual visit you may have with one of our providers in the next 365 days.     If you have a MyChart account, a copy of this consent can be sent to you electronically.  All virtual visits are billed to your insurance company just like a traditional visit in the office.    As this is a virtual visit, video technology does not allow for your provider to perform a traditional examination.  This may limit your provider's ability to fully assess your condition.  If your provider identifies any concerns that need to be evaluated in person or the need to arrange testing (such as labs, EKG, etc.), we will make arrangements to do so.     Although advances in technology are sophisticated, we cannot ensure that it will always work on either your end or our end.  If the connection with a video visit is poor, the visit may have to be switched to a telephone visit.  With either a video or telephone visit, we are not always able to ensure that we have a secure connection.     I need to obtain your verbal consent now.   Are you willing to proceed with your visit today?    [name] has provided verbal consent on 09/28/2021 for a virtual visit (video or telephone).   Perlie Mayo, NP   Date: 09/28/2021 8:50 AM   Virtual Visit via Video Note   I, Perlie Mayo, connected with  Stephanie Schwartz  ([id number], June 13, 1966) on 09/28/21 at  8:45 AM EST by a video-enabled telemedicine application and verified that I am speaking with the correct person using two identifiers.  Location: Patient: Virtual Visit Location Patient: Home Provider: Virtual Visit Location Provider: Home Office   I discussed the limitations of evaluation and management by  telemedicine and the availability of in person appointments. The patient expressed understanding and agreed to proceed.    History of Present Illness: Stephanie Schwartz is a 55 y.o. who identifies as a female who was assigned female at birth, and is being seen today for bronchitis symptoms. Hx of asthma- usually well controlled- reports trouble with cough once a year in winter- no exposure to virus she knows of.  HPI: Cough This is a new problem. The current episode started 1 to 4 weeks ago. The problem has been gradually worsening. The problem occurs constantly. The cough is Productive of blood-tinged sputum. Associated symptoms include postnasal drip and rhinorrhea. Pertinent negatives include no chest pain, chills, ear congestion, ear pain, fever, headaches, myalgias, nasal congestion or shortness of breath. The symptoms are aggravated by pollens and animals. Risk factors for lung disease include animal exposure. She has tried a beta-agonist inhaler, steroid inhaler, leukotriene antagonists and OTC cough suppressant for the symptoms. The treatment provided no relief. Her past medical history is significant for asthma, bronchitis and environmental allergies.   Problems:  Patient Active Problem List   Diagnosis Date Noted   Genetic screening 09/21/2020   Vitamin D deficiency 03/06/2020   Rectal mass 08/16/2019   Endometriosis 08/16/2019   Endometriosis of both ovaries    Idiopathic retroperitoneal fibrosis    Ovarian mass 06/13/2019  Menorrhagia with irregular cycle 06/13/2019   Depression 03/06/2019   Atypical lobular hyperplasia [facility]) of left breast 11/23/2018   Knee pain, right 03/17/2018   Arthritis of carpometacarpal [facility]) joint of left thumb 03/17/2018   Hyperglycemia 03/01/2018   Cough 01/09/2016   Wheezing 01/09/2016   Acute appendicitis 12/02/2014   Liver mass 12/02/2014   Anxiety 02/25/2012   Insomnia 02/25/2012   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   ADD (attention deficit  disorder) 01/29/2011   Encounter for well adult exam with abnormal findings 01/29/2011   Asthma 10/14/2008   SHINGLES 06/07/2008   Allergic rhinitis 03/01/2008   Hypothyroidism 08/21/2007   HYPERCHOLESTEROLEMIA 08/21/2007    Allergies: No Known Allergies Medications:  Current Outpatient Medications:    albuterol (VENTOLIN HFA) 108 (90 Base) MCG/ACT inhaler, Inhale 2 puffs into the lungs every 6 (six) hours as needed for wheezing or shortness of breath., Disp: 18 g, Rfl: 5   ALPRAZolam (XANAX) 0.5 MG tablet, TAKE ONE TABLET BY MOUTH TWICE A DAY AS NEEDED FOR ANXIETY, Disp: 60 tablet, Rfl: 5   budesonide-formoterol (SYMBICORT) 160-4.5 MCG/ACT inhaler, USE 2 INHALATIONS BY MOUTH  TWICE DAILY, Disp: 10.2 g, Rfl: 11   Cholecalciferol (THERA-D 2000) 50 MCG (2000 UT) TABS, 1 tab by mouth once daily, Disp: 30 tablet, Rfl: 99   escitalopram (LEXAPRO) 10 MG tablet, TAKE 1 TABLET BY MOUTH  DAILY THEN STOP AS DIRECTED, Disp: 90 tablet, Rfl: 1   estrogens, conjugated, (PREMARIN) 0.625 MG tablet, Take 1 tablet (0.625 mg total) by mouth daily. Take daily for 21 days then do not take for 7 days., Disp: 30 tablet, Rfl: 11   ibuprofen (ADVIL) 800 MG tablet, Take 1 tablet (800 mg total) by mouth every 8 (eight) hours as needed for moderate pain. For AFTER surgery only, Disp: 30 tablet, Rfl: 0   levothyroxine (SYNTHROID) 175 MCG tablet, Take by mouth., Disp: , Rfl:    montelukast (SINGULAIR) 10 MG tablet, TAKE 1 TABLET BY MOUTH  DAILY, Disp: 90 tablet, Rfl: 3   pantoprazole (PROTONIX) 40 MG tablet, Take 1 tablet (40 mg total) by mouth daily., Disp: 90 tablet, Rfl: 3   scopolamine (TRANSDERM-SCOP, 1.5 MG,) 1 MG/3DAYS, Place 1 patch (1.5 mg total) onto the skin every 3 (three) days., Disp: 10 patch, Rfl: 0   valACYclovir (VALTREX) 1000 MG tablet, TAKE ONE TABLET BY MOUTH TWICE A DAY, Disp: 14 tablet, Rfl: 0  Observations/Objective: Patient is well-developed, well-nourished in no acute distress.  Resting  comfortably  at home.  Head is normocephalic, atraumatic.  No labored breathing.  Speech is clear and coherent with logical content.  Patient is alert and oriented at baseline.  Cough appreciated  Assessment and Plan: 1. Bronchitis S&S consistent with bronchitis and or asthma flare will treat- no antibiotics needed, if not improved advised to be seen in person   Reviewed side effects, risks and benefits of medication.    Patient acknowledged agreement and understanding of the plan.   I discussed the assessment and treatment plan with the patient. The patient was provided an opportunity to ask questions and all were answered. The patient agreed with the plan and demonstrated an understanding of the instructions.   The patient was advised to call back or seek an in-person evaluation if the symptoms worsen or if the condition fails to improve as anticipated.   The above assessment and management plan was discussed with the patient. The patient verbalized understanding of and has agreed to the management plan. Patient is  aware to call the clinic if symptoms persist or worsen. Patient is aware when to return to the clinic for a follow-up visit. Patient educated on when it is appropriate to go to the emergency department.   - benzonatate (TESSALON) 100 MG capsule; Take 1 capsule (100 mg total) by mouth 2 (two) times daily as needed for cough.  Dispense: 20 capsule; Refill: 0 - promethazine-dextromethorphan (PROMETHAZINE-DM) 6.25-15 MG/5ML syrup; Take 2.5 mLs by mouth 3 (three) times daily as needed for cough.  Dispense: 118 mL; Refill: 0 - predniSONE (STERAPRED UNI-PAK 21 TAB) 10 MG (21) TBPK tablet; Take as directed  Dispense: 21 tablet; Refill: 0 - fluticasone (FLONASE) 50 MCG/ACT nasal spray; Place 2 sprays into both nostrils daily.  Dispense: 16 g; Refill: 0 - albuterol (VENTOLIN HFA) 108 (90 Base) MCG/ACT inhaler; Inhale 2 puffs into the lungs every 6 (six) hours as needed for wheezing or  shortness of breath.  Dispense: 8 g; Refill: 0   Follow Up Instructions: I discussed the assessment and treatment plan with the patient. The patient was provided an opportunity to ask questions and all were answered. The patient agreed with the plan and demonstrated an understanding of the instructions.  A copy of instructions were sent to the patient via MyChart unless otherwise noted below.     The patient was advised to call back or seek an in-person evaluation if the symptoms worsen or if the condition fails to improve as anticipated.  Time:  I spent 10 minutes with the patient via telehealth technology discussing the above problems/concerns.    Perlie Mayo, NP

## 2021-09-28 NOTE — Patient Instructions (Signed)
I appreciate the opportunity to provide you with care for your health and wellness.  Take as directed all the medications ordered  If not improved, mucus coloring changes or you develop a fever please be seen in person.  I hope you feel better soon, happy holidays!  Please continue to practice social distancing to keep you, your family, and our community safe.  If you must go out, please wear a mask and practice good handwashing.  Have a wonderful day. With Gratitude, Cherly Beach, DNP, AGNP-BC

## 2021-10-08 ENCOUNTER — Telehealth: Payer: 59 | Admitting: Physician Assistant

## 2021-10-08 DIAGNOSIS — J208 Acute bronchitis due to other specified organisms: Secondary | ICD-10-CM

## 2021-10-08 DIAGNOSIS — B9689 Other specified bacterial agents as the cause of diseases classified elsewhere: Secondary | ICD-10-CM

## 2021-10-08 MED ORDER — BENZONATATE 200 MG PO CAPS: 200.0000 mg | ORAL_CAPSULE | Freq: Three times a day (TID) | ORAL | 0 refills | Status: DC | PRN

## 2021-10-08 MED ORDER — AZITHROMYCIN 250 MG PO TABS: ORAL_TABLET | ORAL | 0 refills | Status: AC

## 2021-10-08 NOTE — Patient Instructions (Signed)
Stephanie Schwartz, thank you for joining Leeanne Rio, PA-C for today's virtual visit.  While this provider is not your primary care provider (PCP), if your PCP is located in our provider database this encounter information will be shared with them immediately following your visit.  Consent: (Patient) Stephanie Schwartz provided verbal consent for this virtual visit at the beginning of the encounter.  Current Medications:  Current Outpatient Medications:    albuterol (VENTOLIN HFA) 108 (90 Base) MCG/ACT inhaler, Inhale 2 puffs into the lungs every 6 (six) hours as needed for wheezing or shortness of breath., Disp: 8 g, Rfl: 0   ALPRAZolam (XANAX) 0.5 MG tablet, TAKE ONE TABLET BY MOUTH TWICE A DAY AS NEEDED FOR ANXIETY, Disp: 60 tablet, Rfl: 5   benzonatate (TESSALON) 100 MG capsule, Take 1 capsule (100 mg total) by mouth 2 (two) times daily as needed for cough., Disp: 20 capsule, Rfl: 0   budesonide-formoterol (SYMBICORT) 160-4.5 MCG/ACT inhaler, USE 2 INHALATIONS BY MOUTH  TWICE DAILY, Disp: 10.2 g, Rfl: 11   Cholecalciferol (THERA-D 2000) 50 MCG (2000 UT) TABS, 1 tab by mouth once daily, Disp: 30 tablet, Rfl: 99   escitalopram (LEXAPRO) 10 MG tablet, TAKE 1 TABLET BY MOUTH  DAILY THEN STOP AS DIRECTED, Disp: 90 tablet, Rfl: 1   estrogens, conjugated, (PREMARIN) 0.625 MG tablet, Take 1 tablet (0.625 mg total) by mouth daily. Take daily for 21 days then do not take for 7 days., Disp: 30 tablet, Rfl: 11   fluticasone (FLONASE) 50 MCG/ACT nasal spray, Place 2 sprays into both nostrils daily., Disp: 16 g, Rfl: 0   ibuprofen (ADVIL) 800 MG tablet, Take 1 tablet (800 mg total) by mouth every 8 (eight) hours as needed for moderate pain. For AFTER surgery only, Disp: 30 tablet, Rfl: 0   levothyroxine (SYNTHROID) 175 MCG tablet, Take by mouth., Disp: , Rfl:    montelukast (SINGULAIR) 10 MG tablet, TAKE 1 TABLET BY MOUTH  DAILY, Disp: 90 tablet, Rfl: 3   pantoprazole (PROTONIX) 40 MG tablet, Take 1  tablet (40 mg total) by mouth daily., Disp: 90 tablet, Rfl: 3   predniSONE (STERAPRED UNI-PAK 21 TAB) 10 MG (21) TBPK tablet, Take as directed, Disp: 21 tablet, Rfl: 0   promethazine-dextromethorphan (PROMETHAZINE-DM) 6.25-15 MG/5ML syrup, Take 2.5 mLs by mouth 3 (three) times daily as needed for cough., Disp: 118 mL, Rfl: 0   scopolamine (TRANSDERM-SCOP, 1.5 MG,) 1 MG/3DAYS, Place 1 patch (1.5 mg total) onto the skin every 3 (three) days., Disp: 10 patch, Rfl: 0   valACYclovir (VALTREX) 1000 MG tablet, TAKE ONE TABLET BY MOUTH TWICE A DAY, Disp: 14 tablet, Rfl: 0   Medications ordered in this encounter:  No orders of the defined types were placed in this encounter.    *If you need refills on other medications prior to your next appointment, please contact your pharmacy*  Follow-Up: Call back or seek an in-person evaluation if the symptoms worsen or if the condition fails to improve as anticipated.  Other Instructions Take antibiotic (Azithromycin) as directed.  Increase fluids.  Get plenty of rest. Use Mucinex for congestion. Tessalon for daytime cough -- use new dose. Syrup for nighttime use. Take a daily probiotic (I recommend Align or Culturelle, but even Activia Yogurt may be beneficial).  A humidifier placed in the bedroom may offer some relief for a dry, scratchy throat of nasal irritation.  Read information below on acute bronchitis. Please call or return to clinic if symptoms are not  improving.  Acute Bronchitis Bronchitis is when the airways that extend from the windpipe into the lungs get red, puffy, and painful (inflamed). Bronchitis often causes thick spit (mucus) to develop. This leads to a cough. A cough is the most common symptom of bronchitis. In acute bronchitis, the condition usually begins suddenly and goes away over time (usually in 2 weeks). Smoking, allergies, and asthma can make bronchitis worse. Repeated episodes of bronchitis may cause more lung problems.  HOME  CARE Rest. Drink enough fluids to keep your pee (urine) clear or pale yellow (unless you need to limit fluids as told by your doctor). Only take over-the-counter or prescription medicines as told by your doctor. Avoid smoking and secondhand smoke. These can make bronchitis worse. If you are a smoker, think about using nicotine gum or skin patches. Quitting smoking will help your lungs heal faster. Reduce the chance of getting bronchitis again by: Washing your hands often. Avoiding people with cold symptoms. Trying not to touch your hands to your mouth, nose, or eyes. Follow up with your doctor as told.  GET HELP IF: Your symptoms do not improve after 1 week of treatment. Symptoms include: Cough. Fever. Coughing up thick spit. Body aches. Chest congestion. Chills. Shortness of breath. Sore throat.  GET HELP RIGHT AWAY IF:  You have an increased fever. You have chills. You have severe shortness of breath. You have bloody thick spit (sputum). You throw up (vomit) often. You lose too much body fluid (dehydration). You have a severe headache. You faint.  MAKE SURE YOU:  Understand these instructions. Will watch your condition. Will get help right away if you are not doing well or get worse. Document Released: 04/05/2008 Document Revised: 06/20/2013 Document Reviewed: 04/10/2013 [facility] Patient Information 2015 [place], [place]. This information is not intended to replace advice given to you by your health care provider. Make sure you discuss any questions you have with your health care provider.    If you have been instructed to have an in-person evaluation today at a local Urgent Care facility, please use the link below. It will take you to a list of all of our available [facility], including address, phone number and hours of operation. Please do not delay care.  [facility]  If you or a family member do not have a primary care provider, use the  link below to schedule a visit and establish care. When you choose a [facility] primary care physician or advanced practice provider, you gain a long-term partner in health. Find a Primary Care Provider  Learn more about 's in-office and virtual care options: [facility]

## 2021-10-08 NOTE — Progress Notes (Signed)
Virtual Visit Consent   Stephanie Schwartz, you are scheduled for a virtual visit with a [facility] provider today.     Just as with appointments in the office, your consent must be obtained to participate.  Your consent will be active for this visit and any virtual visit you may have with one of our providers in the next 365 days.     If you have a MyChart account, a copy of this consent can be sent to you electronically.  All virtual visits are billed to your insurance company just like a traditional visit in the office.    As this is a virtual visit, video technology does not allow for your provider to perform a traditional examination.  This may limit your provider's ability to fully assess your condition.  If your provider identifies any concerns that need to be evaluated in person or the need to arrange testing (such as labs, EKG, etc.), we will make arrangements to do so.     Although advances in technology are sophisticated, we cannot ensure that it will always work on either your end or our end.  If the connection with a video visit is poor, the visit may have to be switched to a telephone visit.  With either a video or telephone visit, we are not always able to ensure that we have a secure connection.     I need to obtain your verbal consent now.   Are you willing to proceed with your visit today?    Stephanie Schwartz has provided verbal consent on 10/08/2021 for a virtual visit (video or telephone).   Leeanne Rio, [place]   Date: 10/08/2021 8:16 AM   Virtual Visit via Video Note   I, Leeanne Rio, connected with  Stephanie Schwartz  ([id number], 04/22/1966) on 10/08/21 at  8:00 AM EST by a video-enabled telemedicine application and verified that I am speaking with the correct person using two identifiers.  Location: Patient: Virtual Visit Location Patient: Home Provider: Virtual Visit Location Provider: Home Office   I discussed the limitations of evaluation and  management by telemedicine and the availability of in person appointments. The patient expressed understanding and agreed to proceed.    History of Present Illness: Stephanie Schwartz is a 55 y.o. who identifies as a female who was assigned female at birth, and is being seen today for recurrence of productive cough after treatment for viral bronchitis. Notes several weeks of symptoms leading up to her last visit a week ago. Was prescribed cough medication and course of steroids which she endorses taking as directed and tolerating well. Notes that symptoms were much improved until yesterday when cough came back "with vengeance". Now with persistent cough with chest wall tenderness and thick phlegm. Denies fever, chills, chest tightness or wheezing. Denies heartburn or indigestion.   Cough drops and Tessalon Perles but are not helping.  Cough syrup last night which helps but makes her loopy.    HPI: HPI  Problems:  Patient Active Problem List   Diagnosis Date Noted   Genetic screening 09/21/2020   Vitamin D deficiency 03/06/2020   Rectal mass 08/16/2019   Endometriosis 08/16/2019   Endometriosis of both ovaries    Idiopathic retroperitoneal fibrosis    Ovarian mass 06/13/2019   Menorrhagia with irregular cycle 06/13/2019   Depression 03/06/2019   Atypical lobular hyperplasia [facility]) of left breast 11/23/2018   Knee pain, right 03/17/2018   Arthritis of carpometacarpal (CMC) joint of  left thumb 03/17/2018   Hyperglycemia 03/01/2018   Cough 01/09/2016   Wheezing 01/09/2016   Acute appendicitis 12/02/2014   Liver mass 12/02/2014   Anxiety 02/25/2012   Insomnia 02/25/2012   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   ADD (attention deficit disorder) 01/29/2011   Encounter for well adult exam with abnormal findings 01/29/2011   Asthma 10/14/2008   SHINGLES 06/07/2008   Allergic rhinitis 03/01/2008   Hypothyroidism 08/21/2007   HYPERCHOLESTEROLEMIA 08/21/2007    Allergies: No Known  Allergies Medications:  Current Outpatient Medications:    azithromycin (ZITHROMAX) 250 MG tablet, Take 2 tablets on day 1, then 1 tablet daily on days 2 through 5, Disp: 6 tablet, Rfl: 0   benzonatate (TESSALON) 200 MG capsule, Take 1 capsule (200 mg total) by mouth 3 (three) times daily as needed for cough., Disp: 30 capsule, Rfl: 0   albuterol (VENTOLIN HFA) 108 (90 Base) MCG/ACT inhaler, Inhale 2 puffs into the lungs every 6 (six) hours as needed for wheezing or shortness of breath., Disp: 8 g, Rfl: 0   ALPRAZolam (XANAX) 0.5 MG tablet, TAKE ONE TABLET BY MOUTH TWICE A DAY AS NEEDED FOR ANXIETY, Disp: 60 tablet, Rfl: 5   budesonide-formoterol (SYMBICORT) 160-4.5 MCG/ACT inhaler, USE 2 INHALATIONS BY MOUTH  TWICE DAILY, Disp: 10.2 g, Rfl: 11   Cholecalciferol (THERA-D 2000) 50 MCG (2000 UT) TABS, 1 tab by mouth once daily, Disp: 30 tablet, Rfl: 99   escitalopram (LEXAPRO) 10 MG tablet, TAKE 1 TABLET BY MOUTH  DAILY THEN STOP AS DIRECTED, Disp: 90 tablet, Rfl: 1   estrogens, conjugated, (PREMARIN) 0.625 MG tablet, Take 1 tablet (0.625 mg total) by mouth daily. Take daily for 21 days then do not take for 7 days., Disp: 30 tablet, Rfl: 11   fluticasone (FLONASE) 50 MCG/ACT nasal spray, Place 2 sprays into both nostrils daily., Disp: 16 g, Rfl: 0   ibuprofen (ADVIL) 800 MG tablet, Take 1 tablet (800 mg total) by mouth every 8 (eight) hours as needed for moderate pain. For AFTER surgery only, Disp: 30 tablet, Rfl: 0   levothyroxine (SYNTHROID) 175 MCG tablet, Take by mouth., Disp: , Rfl:    montelukast (SINGULAIR) 10 MG tablet, TAKE 1 TABLET BY MOUTH  DAILY, Disp: 90 tablet, Rfl: 3   pantoprazole (PROTONIX) 40 MG tablet, Take 1 tablet (40 mg total) by mouth daily., Disp: 90 tablet, Rfl: 3   predniSONE (STERAPRED UNI-PAK 21 TAB) 10 MG (21) TBPK tablet, Take as directed, Disp: 21 tablet, Rfl: 0   promethazine-dextromethorphan (PROMETHAZINE-DM) 6.25-15 MG/5ML syrup, Take 2.5 mLs by mouth 3 (three) times  daily as needed for cough., Disp: 118 mL, Rfl: 0   scopolamine (TRANSDERM-SCOP, 1.5 MG,) 1 MG/3DAYS, Place 1 patch (1.5 mg total) onto the skin every 3 (three) days., Disp: 10 patch, Rfl: 0   valACYclovir (VALTREX) 1000 MG tablet, TAKE ONE TABLET BY MOUTH TWICE A DAY, Disp: 14 tablet, Rfl: 0  Observations/Objective: Patient is well-developed, well-nourished in no acute distress.  Resting comfortably at home.  Head is normocephalic, atraumatic.  No labored breathing. Speech is clear and coherent with logical content.  Patient is alert and oriented at baseline.   Assessment and Plan: 1. Acute bacterial bronchitis - azithromycin (ZITHROMAX) 250 MG tablet; Take 2 tablets on day 1, then 1 tablet daily on days 2 through 5  Dispense: 6 tablet; Refill: 0 - benzonatate (TESSALON) 200 MG capsule; Take 1 capsule (200 mg total) by mouth 3 (three) times daily as needed for cough.  Dispense: 30 capsule; Refill: 0 Rx Azithromycin.  Increase fluids.  Rest.  Saline nasal spray.  Probiotic.  Mucinex as directed.  Humidifier in bedroom. Will send in script for 200 mg dose of Tessalon to help with daytime cough as previously given syrup makes her too loopy to take during day.  She will need in-person evaluation for any non-resolving, new or worsening symptoms.  Follow Up Instructions: I discussed the assessment and treatment plan with the patient. The patient was provided an opportunity to ask questions and all were answered. The patient agreed with the plan and demonstrated an understanding of the instructions.  A copy of instructions were sent to the patient via MyChart unless otherwise noted below.    The patient was advised to call back or seek an in-person evaluation if the symptoms worsen or if the condition fails to improve as anticipated.  Time:  I spent 13 minutes with the patient via telehealth technology discussing the above problems/concerns.    Leeanne Rio, PA-C

## 2021-10-20 ENCOUNTER — Other Ambulatory Visit: Payer: Self-pay

## 2021-10-20 ENCOUNTER — Ambulatory Visit: Payer: 59 | Admitting: Internal Medicine

## 2021-10-20 ENCOUNTER — Encounter: Payer: Self-pay | Admitting: Internal Medicine

## 2021-10-20 VITALS — BP 131/80 | HR 68 | Temp 98.5°F | Ht 69.0 in | Wt 178.0 lb

## 2021-10-20 DIAGNOSIS — R739 Hyperglycemia, unspecified: Secondary | ICD-10-CM

## 2021-10-20 DIAGNOSIS — R051 Acute cough: Secondary | ICD-10-CM | POA: Diagnosis not present

## 2021-10-20 DIAGNOSIS — R062 Wheezing: Secondary | ICD-10-CM | POA: Diagnosis not present

## 2021-10-20 DIAGNOSIS — F32A Depression, unspecified: Secondary | ICD-10-CM

## 2021-10-20 MED ORDER — METHYLPREDNISOLONE ACETATE 80 MG/ML IJ SUSP
80.0000 mg | Freq: Once | INTRAMUSCULAR | Status: AC
Start: 2021-10-20 — End: 2021-10-20
  Administered 2021-10-20: 17:00:00 80 mg via INTRAMUSCULAR

## 2021-10-20 MED ORDER — LEVOFLOXACIN 500 MG PO TABS: 500.0000 mg | ORAL_TABLET | Freq: Every day | ORAL | 0 refills | Status: AC

## 2021-10-20 MED ORDER — HYDROCODONE BIT-HOMATROP MBR 5-1.5 MG/5ML PO SOLN: 5.0000 mL | Freq: Four times a day (QID) | ORAL | 0 refills | Status: AC | PRN

## 2021-10-20 MED ORDER — PREDNISONE 10 MG PO TABS: ORAL_TABLET | ORAL | 0 refills | Status: DC

## 2021-10-20 NOTE — Assessment & Plan Note (Signed)
Lab Results  Component Value Date   HGBA1C 5.6 03/04/2021   Stable, pt to continue current medical treatment - diet

## 2021-10-20 NOTE — Assessment & Plan Note (Signed)
Stable overall, declines need for change in tx or referral 

## 2021-10-20 NOTE — Progress Notes (Signed)
Patient ID: Stephanie Schwartz, female   DOB: 04-May-1966, 55 y.o.   MRN: [id number]        Chief Complaint: follow up cough, wheezing x  3days       HPI:  Stephanie Schwartz is a 55 y.o. female Here with acute onset mild to mod 2-3 days ST, HA, general weakness and malaise, with prod cough greenish sputum, but Pt denies chest pain, increased sob or doe, wheezing, orthopnea, PND, increased LE swelling, palpitations, dizziness or syncope, except for 1 days onset worsening wheezing, sob.  .. Pt denies polydipsia, polyuria, or new focal neuro s/s.   Denies worsening depressive symptoms, suicidal ideation, or panic         Wt Readings from Last 3 Encounters:  10/20/21 178 lb (80.7 kg)  03/25/21 191 lb (86.6 kg)  09/16/20 195 lb (88.5 kg)   BP Readings from Last 3 Encounters:  10/20/21 131/80  03/25/21 126/80  09/16/20 118/80         Past Medical History:  Diagnosis Date   ALLERGIC RHINITIS 03/01/2008   Allergy    Anxiety 02/25/2012   Arthritis    left thumb   ASTHMA    "allergy induced"   Asthma    seasonal   ASTHMATIC BRONCHITIS, ACUTE 10/14/2008   Heart murmur    "maybe" (12/02/2014)   Hemangioma of liver    HYPERCHOLESTEROLEMIA 08/21/2007   HYPOTHYROIDISM    OTITIS MEDIA, SEROUS, ACUTE, LEFT 03/15/2008   Ovarian mass    PONV (postoperative nausea and vomiting)    SHINGLES 06/07/2008   Wheezing 10/14/2009   Past Surgical History:  Procedure Laterality Date   COLONOSCOPY  05/2019   cyst removed from neck area     age 19   [place] N/A 12/02/2014   Procedure: APPENDECTOMY LAPAROSCOPIC;  Surgeon: Ralene Ok, MD;  Location: [facility];  Service: General;  Laterality: N/A;   REDUCTION MAMMAPLASTY Bilateral ~ [place] N/A 08/16/2019   Procedure: XI ROBOTIC ASSISTED TOTAL HYSTERECTOMY WITH BILATERAL SALPINGO OOPHORECTOMY;  Surgeon: Everitt Amber, MD;  Location: [facility];  Service: Gynecology;  Laterality:  N/A;   SHOULDER ARTHROSCOPY Left ~ 2009   WISDOM TOOTH EXTRACTION     with sedation   XI ROBOTIC ASSISTED LOWER ANTERIOR RESECTION N/A 08/16/2019   Procedure: XI ROBOTIC ASSISTED LOWER ANTERIOR RESECTION, FLEXIBLE SIGMOIDOSCOPY; ICG;  Surgeon: Ileana Roup, MD;  Location: [facility];  Service: General;  Laterality: N/A;    reports that she has never smoked. She has never used smokeless tobacco. She reports current alcohol use of about 14.0 standard drinks per week. She reports that she does not use drugs. family history includes Alcohol abuse in an other family member; Breast cancer in her maternal aunt; Cancer in an other family member; Colon polyps in her father; Diabetes in an other family member; Hyperlipidemia in an other family member; Hypertension in an other family member; Prostate cancer in her father. No Known Allergies Current Outpatient Medications on File Prior to Visit  Medication Sig Dispense Refill   albuterol (VENTOLIN HFA) 108 (90 Base) MCG/ACT inhaler Inhale 2 puffs into the lungs every 6 (six) hours as needed for wheezing or shortness of breath. 8 g 0   ALPRAZolam (XANAX) 0.5 MG tablet TAKE ONE TABLET BY MOUTH TWICE A DAY AS NEEDED FOR ANXIETY 60 tablet 5   benzonatate (TESSALON) 200 MG capsule Take 1 capsule (200 mg total) by mouth 3 (three)  times daily as needed for cough. 30 capsule 0   budesonide-formoterol (SYMBICORT) 160-4.5 MCG/ACT inhaler USE 2 INHALATIONS BY MOUTH  TWICE DAILY 10.2 g 11   Cholecalciferol (THERA-D 2000) 50 MCG (2000 UT) TABS 1 tab by mouth once daily 30 tablet 99   escitalopram (LEXAPRO) 10 MG tablet TAKE 1 TABLET BY MOUTH  DAILY THEN STOP AS DIRECTED 90 tablet 1   estrogens, conjugated, (PREMARIN) 0.625 MG tablet Take 1 tablet (0.625 mg total) by mouth daily. Take daily for 21 days then do not take for 7 days. 30 tablet 11   fluticasone (FLONASE) 50 MCG/ACT nasal spray Place 2 sprays into both nostrils daily. 16 g 0   ibuprofen (ADVIL) 800 MG  tablet Take 1 tablet (800 mg total) by mouth every 8 (eight) hours as needed for moderate pain. For AFTER surgery only 30 tablet 0   levothyroxine (SYNTHROID) 175 MCG tablet Take by mouth.     montelukast (SINGULAIR) 10 MG tablet TAKE 1 TABLET BY MOUTH  DAILY 90 tablet 3   pantoprazole (PROTONIX) 40 MG tablet Take 1 tablet (40 mg total) by mouth daily. 90 tablet 3   promethazine-dextromethorphan (PROMETHAZINE-DM) 6.25-15 MG/5ML syrup Take 2.5 mLs by mouth 3 (three) times daily as needed for cough. 118 mL 0   valACYclovir (VALTREX) 1000 MG tablet TAKE ONE TABLET BY MOUTH TWICE A DAY 14 tablet 0   No current facility-administered medications on file prior to visit.        ROS:  All others reviewed and negative.  Objective        PE:  BP 131/80 (BP Location: Right Arm, Patient Position: Sitting, Cuff Size: Normal)    Pulse 68    Temp 98.5 F (36.9 C) (Oral)    Ht 5\' 9"  (1.753 m)    Wt 178 lb (80.7 kg)    LMP 07/30/2019 (Exact Date)    SpO2 99%    BMI 26.29 kg/m                 Constitutional: Pt appears in NAD, mild ill               HENT: Head: NCAT.                Right Ear: External ear normal.                 Left Ear: External ear normal. Bilat tm's with mild erythema.  Max sinus areas mild tender.  Pharynx with mild erythema, no exudate               Eyes: . Pupils are equal, round, and reactive to light. Conjunctivae and EOM are normal               Nose: without d/c or deformity               Neck: Neck supple. Gross normal ROM               Cardiovascular: Normal rate and regular rhythm.                 Pulmonary/Chest: Effort normal and breath sounds without rales but with mild bilateral wheezing.                               Neurological: Pt is alert. At baseline orientation, motor grossly intact  Skin: Skin is warm. No rashes, no other new lesions, LE edema - none               Psychiatric: Pt behavior is normal without agitation   Micro: none  Cardiac tracings  I have personally interpreted today:  none  Pertinent Radiological findings (summarize): none   Lab Results  Component Value Date   WBC 3.8 (L) 03/04/2021   HGB 13.6 03/04/2021   HCT 40.1 03/04/2021   PLT 241.0 03/04/2021   GLUCOSE 88 03/04/2021   CHOL 244 (H) 03/04/2021   TRIG 114.0 03/04/2021   HDL 51.40 03/04/2021   LDLDIRECT 170.0 03/06/2020   LDLCALC 169 (H) 03/04/2021   ALT 47 (H) 03/04/2021   AST 26 03/04/2021   NA 139 03/04/2021   K 4.2 03/04/2021   CL 106 03/04/2021   CREATININE 0.96 03/04/2021   BUN 18 03/04/2021   CO2 27 03/04/2021   TSH 0.64 03/04/2021   INR 0.9 08/10/2019   HGBA1C 5.6 03/04/2021   Assessment/Plan:  Stephanie Schwartz is a 49 y.o. White or Caucasian [1] female with  has a past medical history of ALLERGIC RHINITIS (03/01/2008), Allergy, Anxiety (02/25/2012), Arthritis, ASTHMA, Asthma, ASTHMATIC BRONCHITIS, ACUTE (10/14/2008), Heart murmur, Hemangioma of liver, HYPERCHOLESTEROLEMIA (08/21/2007), HYPOTHYROIDISM, OTITIS MEDIA, SEROUS, ACUTE, LEFT (03/15/2008), Ovarian mass, PONV (postoperative nausea and vomiting), SHINGLES (06/07/2008), and Wheezing (10/14/2009).  Cough Mild to mod, c/w bronchitis vs pna, for antibx course, cough med prn,  to f/u any worsening symptoms or concerns  Wheezing Mild to mod, for depomedeol im 80, predpac asd, to f/u any worsening symptoms or concerns  Hyperglycemia Lab Results  Component Value Date   HGBA1C 5.6 03/04/2021   Stable, pt to continue current medical treatment - diet   Depression Stable overall, declines need for change in tx or referral   Followup: Return if symptoms worsen or fail to improve.  Cathlean Cower, MD 10/20/2021 7:58 PM [facility]

## 2021-10-20 NOTE — Assessment & Plan Note (Signed)
Mild to mod, c/w bronchitis vs pna, for antibx course, cough med prn,  to f/u any worsening symptoms or concerns 

## 2021-10-20 NOTE — Patient Instructions (Signed)
You had the steroid shot today  Please take all new medication as prescribed - the antibiotic prednisone, and cough medicine  Please continue all other medications as before, and refills have been done if requested.  Please have the pharmacy call with any other refills you may need.  Please keep your appointments with your specialists as you may have planned

## 2021-10-20 NOTE — Assessment & Plan Note (Signed)
Mild to mod, for depomedeol im 80, predpac asd, to f/u any worsening symptoms or concerns

## 2021-12-12 ENCOUNTER — Other Ambulatory Visit: Payer: Self-pay | Admitting: Internal Medicine

## 2021-12-13 NOTE — Telephone Encounter (Signed)
Please refill as per office routine med refill policy (all routine meds to be refilled for 3 mo or monthly (per pt preference) up to one year from last visit, then month to month grace period for 3 mo, then further med refills will have to be denied) ? ?

## 2022-01-02 ENCOUNTER — Emergency Department (HOSPITAL_COMMUNITY)
Admission: EM | Admit: 2022-01-02 | Discharge: 2022-01-02 | Disposition: A | Discharge status: Home / Self Care | Payer: 59 | Attending: Emergency Medicine | Admitting: Emergency Medicine

## 2022-01-02 ENCOUNTER — Encounter (HOSPITAL_COMMUNITY): Payer: Self-pay | Admitting: Emergency Medicine

## 2022-01-02 ENCOUNTER — Other Ambulatory Visit: Payer: Self-pay

## 2022-01-02 ENCOUNTER — Emergency Department (HOSPITAL_COMMUNITY): Payer: 59

## 2022-01-02 DIAGNOSIS — R109 Unspecified abdominal pain: Secondary | ICD-10-CM | POA: Diagnosis present

## 2022-01-02 DIAGNOSIS — N200 Calculus of kidney: Secondary | ICD-10-CM | POA: Diagnosis not present

## 2022-01-02 DIAGNOSIS — R748 Abnormal levels of other serum enzymes: Secondary | ICD-10-CM | POA: Insufficient documentation

## 2022-01-02 LAB — CBC WITH DIFFERENTIAL/PLATELET
Abs Immature Granulocytes: 0.02 10*3/uL (ref 0.00–0.07)
Basophils Absolute: 0.1 10*3/uL (ref 0.0–0.1)
Basophils Relative: 1 %
Eosinophils Absolute: 0.2 10*3/uL (ref 0.0–0.5)
Eosinophils Relative: 3 %
HCT: 39.9 % (ref 36.0–46.0)
Hemoglobin: 13.4 g/dL (ref 12.0–15.0)
Immature Granulocytes: 0 %
Lymphocytes Relative: 22 %
Lymphs Abs: 1.5 10*3/uL (ref 0.7–4.0)
MCH: 32.8 pg (ref 26.0–34.0)
MCHC: 33.6 g/dL (ref 30.0–36.0)
MCV: 97.6 fL (ref 80.0–100.0)
Monocytes Absolute: 0.5 10*3/uL (ref 0.1–1.0)
Monocytes Relative: 8 %
Neutro Abs: 4.5 10*3/uL (ref 1.7–7.7)
Neutrophils Relative %: 66 %
Platelets: 294 10*3/uL (ref 150–400)
RBC: 4.09 MIL/uL (ref 3.87–5.11)
RDW: 11.9 % (ref 11.5–15.5)
WBC: 6.8 10*3/uL (ref 4.0–10.5)
nRBC: 0 % (ref 0.0–0.2)

## 2022-01-02 LAB — COMPREHENSIVE METABOLIC PANEL
ALT: 43 U/L (ref 0–44)
AST: 33 U/L (ref 15–41)
Albumin: 4 g/dL (ref 3.5–5.0)
Alkaline Phosphatase: 56 U/L (ref 38–126)
Anion gap: 6 (ref 5–15)
BUN: 19 mg/dL (ref 6–20)
CO2: 24 mmol/L (ref 22–32)
Calcium: 9.1 mg/dL (ref 8.9–10.3)
Chloride: 106 mmol/L (ref 98–111)
Creatinine, Ser: 0.94 mg/dL (ref 0.44–1.00)
GFR, Estimated: >60 mL/min (ref >60)
Glucose, Bld: 116 mg/dL — ABNORMAL HIGH (ref 70–99)
Potassium: 3.7 mmol/L (ref 3.5–5.1)
Sodium: 136 mmol/L (ref 135–145)
Total Bilirubin: 0.6 mg/dL (ref 0.3–1.2)
Total Protein: 6.9 g/dL (ref 6.5–8.1)

## 2022-01-02 LAB — URINALYSIS, MICROSCOPIC (REFLEX): RBC / HPF: >50 RBC/hpf (ref 0–5)

## 2022-01-02 LAB — URINALYSIS, ROUTINE W REFLEX MICROSCOPIC
Glucose, UA: NEGATIVE mg/dL
Ketones, ur: NEGATIVE mg/dL
Leukocytes,Ua: NEGATIVE
Nitrite: NEGATIVE
Specific Gravity, Urine: >1.03 — ABNORMAL HIGH (ref 1.005–1.030)
pH: 5.5 (ref 5.0–8.0)

## 2022-01-02 LAB — LIPASE, BLOOD: Lipase: 65 U/L — ABNORMAL HIGH (ref 11–51)

## 2022-01-02 MED ORDER — TAMSULOSIN HCL 0.4 MG PO CAPS: 0.4000 mg | ORAL_CAPSULE | Freq: Every day | ORAL | 0 refills | Status: DC

## 2022-01-02 MED ORDER — KETOROLAC TROMETHAMINE 15 MG/ML IJ SOLN
15.0000 mg | Freq: Once | INTRAMUSCULAR | Status: AC
Start: 2022-01-02 — End: 2022-01-02
  Administered 2022-01-02: 15 mg via INTRAVENOUS
  Filled 2022-01-02: qty 1

## 2022-01-02 MED ORDER — KETOROLAC TROMETHAMINE 15 MG/ML IJ SOLN
15.0000 mg | Freq: Once | INTRAMUSCULAR | Status: AC
  Administered 2022-01-02: 15 mg via INTRAVENOUS
  Filled 2022-01-02: qty 1

## 2022-01-02 MED ORDER — ONDANSETRON HCL 4 MG/2ML IJ SOLN
4.0000 mg | Freq: Once | INTRAMUSCULAR | Status: AC
  Administered 2022-01-02: 4 mg via INTRAVENOUS
  Filled 2022-01-02: qty 2

## 2022-01-02 MED ORDER — OXYCODONE-ACETAMINOPHEN 5-325 MG PO TABS
1.0000 | ORAL_TABLET | Freq: Four times a day (QID) | ORAL | 0 refills | Status: DC | PRN
Start: 2022-01-02 — End: 2022-01-02

## 2022-01-02 MED ORDER — ONDANSETRON HCL 4 MG PO TABS: 4.0000 mg | ORAL_TABLET | Freq: Three times a day (TID) | ORAL | 0 refills | Status: DC | PRN

## 2022-01-02 MED ORDER — OXYCODONE-ACETAMINOPHEN 5-325 MG PO TABS: 1.0000 | ORAL_TABLET | Freq: Four times a day (QID) | ORAL | 0 refills | Status: DC | PRN

## 2022-01-02 MED ORDER — MORPHINE SULFATE (PF) 4 MG/ML IV SOLN
4.0000 mg | Freq: Once | INTRAVENOUS | Status: AC
  Administered 2022-01-02: 4 mg via INTRAVENOUS
  Filled 2022-01-02: qty 1

## 2022-01-02 NOTE — ED Triage Notes (Signed)
Patient reports a few weeks of hematuria and had sudden onset of L flank pain today with some nausea.  ?

## 2022-01-02 NOTE — Discharge Instructions (Addendum)
You have been diagnosed with having a 82m kidney stone on the left side.  Please use a urine strainer to collect the stone and bring it to your urologist for analysis.  Take medications prescribed.  Return if your symptoms worsen or if you have other concerns.  ?

## 2022-01-02 NOTE — ED Provider Notes (Signed)
[facility] DEPT Provider Note   CSN: [id number] Arrival date & time: 01/02/22  1406     History  Chief Complaint  Patient presents with   Flank Pain   Hematuria    Stephanie Schwartz is a 56 y.o. female.  The history is provided by the patient and medical records. No language interpreter was used.  Flank Pain  Hematuria   56 year old female significant history of ovarian mass status post hysterectomy presenting complaint flank pain.  Patient report for the past week she has noted intermittent blood in the urine.  She initially was seen by her OB/GYN for assessment and states she did not have any signs of urinary tract infection.  She was given a referral to urologist to be seen in about a week.  Today she developed acute onset of left flank pain thus prompting this ER visit.  Pain is sharp, stabbing, nonradiating, very uncomfortable and has been persistent.  She endorsed nausea without vomiting.  She denies having fever chills no runny nose sneezing or coughing no dysuria.  No history of active cancer  Home Medications Prior to Admission medications   Medication Sig Start Date End Date Taking? Authorizing Provider  albuterol (VENTOLIN HFA) 108 (90 Base) MCG/ACT inhaler Inhale 2 puffs into the lungs every 6 (six) hours as needed for wheezing or shortness of breath. 09/28/21   Perlie Mayo, NP  ALPRAZolam Duanne Moron) 0.5 MG tablet TAKE ONE TABLET BY MOUTH TWICE A DAY AS NEEDED FOR ANXIETY 12/02/20   Biagio Borg, MD  benzonatate (TESSALON) 200 MG capsule Take 1 capsule (200 mg total) by mouth 3 (three) times daily as needed for cough. 10/08/21   Brunetta Jeans, PA-C  budesonide-formoterol (SYMBICORT) 160-4.5 MCG/ACT inhaler USE 2 INHALATIONS BY MOUTH  TWICE DAILY 11/25/20   Biagio Borg, MD  Cholecalciferol (THERA-D 2000) 50 MCG (2000 UT) TABS 1 tab by mouth once daily 03/25/21   Biagio Borg, MD  escitalopram (LEXAPRO) 10 MG tablet TAKE 1 TABLET BY MOUTH   DAILY THEN STOP AS DIRECTED 09/14/21   Biagio Borg, MD  estrogens, conjugated, (PREMARIN) 0.625 MG tablet Take 1 tablet (0.625 mg total) by mouth daily. Take daily for 21 days then do not take for 7 days. 11/14/20   Cross, Lenna Sciara D, NP  fluticasone (FLONASE) 50 MCG/ACT nasal spray Place 2 sprays into both nostrils daily. 09/28/21   Perlie Mayo, NP  ibuprofen (ADVIL) 800 MG tablet Take 1 tablet (800 mg total) by mouth every 8 (eight) hours as needed for moderate pain. For AFTER surgery only 10/01/19   [name] D, NP  levothyroxine (SYNTHROID) 175 MCG tablet Take by mouth. 09/06/20   [provider]  montelukast (SINGULAIR) 10 MG tablet TAKE 1 TABLET BY MOUTH  DAILY 12/14/21   Biagio Borg, MD  pantoprazole (PROTONIX) 40 MG tablet Take 1 tablet (40 mg total) by mouth daily. 03/25/21   Biagio Borg, MD  predniSONE (DELTASONE) 10 MG tablet 3 tabs by mouth per day for 3 days,2tabs per day for 3 days,1tab per day for 3 days 10/20/21   Biagio Borg, MD  promethazine-dextromethorphan (PROMETHAZINE-DM) 6.25-15 MG/5ML syrup Take 2.5 mLs by mouth 3 (three) times daily as needed for cough. 09/28/21   Perlie Mayo, NP  valACYclovir (VALTREX) 1000 MG tablet TAKE ONE TABLET BY MOUTH TWICE A DAY 12/19/20   Biagio Borg, MD      Allergies    Patient  has no known allergies.    Review of Systems   Review of Systems  Genitourinary:  Positive for flank pain and hematuria.  All other systems reviewed and are negative.  Physical Exam Updated Vital Signs BP (!) 142/77 (BP Location: Left Arm)    Pulse 69    Temp 97.8 F (36.6 C) (Oral)    Resp 18    Ht '5\' 9"'$  (1.753 m)    Wt 78.5 kg    LMP 07/30/2019 (Exact Date)    SpO2 99%    BMI 25.55 kg/m  Physical Exam Vitals and nursing note reviewed.  Constitutional:      General: She is not in acute distress.    Appearance: She is well-developed.     Comments: Patient appears uncomfortable  HENT:     Head: Atraumatic.  Eyes:      Conjunctiva/sclera: Conjunctivae normal.  Cardiovascular:     Rate and Rhythm: Normal rate and regular rhythm.  Pulmonary:     Effort: Pulmonary effort is normal.  Abdominal:     Tenderness: There is no right CVA tenderness or left CVA tenderness.     Comments: No CVA tenderness  Musculoskeletal:     Cervical back: Neck supple.  Skin:    Findings: No rash.  Neurological:     Mental Status: She is alert.  Psychiatric:        Mood and Affect: Mood normal.    ED Results / Procedures / Treatments   Labs (all labs ordered are listed, but only abnormal results are displayed) Labs Reviewed  URINALYSIS, ROUTINE W REFLEX MICROSCOPIC - Abnormal; Notable for the following components:      Result Value   Specific Gravity, Urine >1.030 (*)    Hgb urine dipstick LARGE (*)    Bilirubin Urine SMALL (*)    Protein, ur TRACE (*)    All other components within normal limits  COMPREHENSIVE METABOLIC PANEL - Abnormal; Notable for the following components:   Glucose, Bld 116 (*)    All other components within normal limits  LIPASE, BLOOD - Abnormal; Notable for the following components:   Lipase 65 (*)    All other components within normal limits  URINALYSIS, MICROSCOPIC (REFLEX) - Abnormal; Notable for the following components:   Bacteria, UA RARE (*)    All other components within normal limits  CBC WITH DIFFERENTIAL/PLATELET    EKG None  Radiology CT Renal Stone Study  Result Date: 01/02/2022 CLINICAL DATA:  Left flank pain for 2 weeks, hematuria EXAM: CT ABDOMEN AND PELVIS WITHOUT CONTRAST TECHNIQUE: Multidetector CT imaging of the abdomen and pelvis was performed following the standard protocol without IV contrast. RADIATION DOSE REDUCTION: This exam was performed according to the departmental dose-optimization program which includes automated exposure control, adjustment of the mA and/or kV according to patient size and/or use of iterative reconstruction technique. COMPARISON:   05/25/2019 FINDINGS: Lower chest: No acute abnormality. Hepatobiliary: Unchanged, faintly calcified hypodense subcapsular lesion of the anterior right lobe of the liver measuring 5.2 x 3.5 cm (series 2, image 17). Hepatomegaly, maximum coronal span 22.1 cm. Small gallstones in the gallbladder fundus (series 2, image 39). No gallbladder wall thickening, or biliary dilatation. Pancreas: Unremarkable. No pancreatic ductal dilatation or surrounding inflammatory changes. Spleen: Normal in size without significant abnormality. Adrenals/Urinary Tract: Adrenal glands are unremarkable. There is a 0.6 cm calculus in the proximal third of the left ureter with mild associated hydronephrosis and proximal hydroureter (series 2, image 37). No right-sided calculi. Bladder  is unremarkable. Stomach/Bowel: Stomach is within normal limits. Appendix appears normal. No evidence of bowel wall thickening, distention, or inflammatory changes. Status post rectosigmoid colon resection and reanastomosis Vascular/Lymphatic: Aortic atherosclerosis. No enlarged abdominal or pelvic lymph nodes. Reproductive: Status post hysterectomy. Other: No abdominal wall hernia or abnormality. No ascites. Musculoskeletal: No acute or significant osseous findings. IMPRESSION: 1. There is a 0.6 cm calculus in the proximal third of the left ureter with mild associated hydronephrosis and proximal hydroureter. No right-sided calculi or right-sided hydronephrosis. 2. Status post rectosigmoid colon resection and reanastomosis. 3. Hepatomegaly, maximum coronal span 22.1 cm. 4. Unchanged, faintly calcified hypodense subcapsular lesion of the anterior right lobe of the liver measuring 5.2 x 3.5 cm, benign and most likely a hemangioma, better characterized by previous contrast enhanced examinations and MR. 5. Cholelithiasis without evidence of acute cholecystitis. Aortic Atherosclerosis (ICD10-I70.0). Electronically Signed   By: Delanna Ahmadi M.D.   On: 01/02/2022 15:15     Procedures Procedures    Medications Ordered in ED Medications  morphine (PF) 4 MG/ML injection 4 mg (4 mg Intravenous Given 01/02/22 1447)  ondansetron (ZOFRAN) injection 4 mg (4 mg Intravenous Given 01/02/22 1447)  ketorolac (TORADOL) 15 MG/ML injection 15 mg (15 mg Intravenous Given 01/02/22 1528)  ketorolac (TORADOL) 15 MG/ML injection 15 mg (15 mg Intravenous Given 01/02/22 1629)    ED Course/ Medical Decision Making/ A&P                           Medical Decision Making Amount and/or Complexity of Data Reviewed Labs: ordered. Radiology: ordered.  Risk Prescription drug management.   BP (!) 142/77 (BP Location: Left Arm)    Pulse 69    Temp 97.8 F (36.6 C) (Oral)    Resp 18    Ht '5\' 9"'$  (1.753 m)    Wt 78.5 kg    LMP 07/30/2019 (Exact Date)    SpO2 99%    BMI 25.55 kg/m   2:38 PM This is a 56 year old female with prior ovarian mass status post hysterectomy, liver mass secondary to hemangioma presenting with complaints of left flank pain and hematuria.  She hematuria has been an ongoing issue for the past several days, left flank pain is acute onset that started earlier today.  Patient appears uncomfortable.  Symptoms suggestive of nephrolithiasis however, malignancy is a possibility.  Will provide IV opiate pain medication, antinausea medication, and will obtain CT scan for further assessment.  3:22 PM Labs and imaging independently reviewed and interpreted by me.  Patient has normal WBC, normal H&H, normal electrolyte panel, normal renal function, mildly elevated lipase of 65, urinalysis shows large amounts of hemoglobin and urine dipsticks but no evidence of UTI.  A CT is renal stone study obtained and demonstrate a 6 mm calculus in the proximal third of the left ureter with hydronephrosis and hydroureter.  This finding is consistent with patient's presentation.  After receiving the additional dose of morphine for pain control, she reported minimal improvement.  Will give Toradol  for better pain management.  Patient made aware of findings  4:44 PM Pt received multiple dosage of pain medication and now pain is more controlled.  Will d/c home with appropriate treatment and outpt f/u.  Return precaution given.   This patient presents to the ED for concern of flank pain, this involves an extensive number of treatment options, and is a complaint that carries with it a high risk of complications and morbidity.  The differential diagnosis includes kidney stone, pyelonephritis, colitis, diverticulitis, aortic dissection, shingle, RCC, MSK, shingle  Co morbidities that complicate the patient evaluation endometriosis  Shingle  Ovarian mass Additional history obtained:  Additional history obtained from friend External records from outside source obtained and reviewed including PCP visits  Lab Tests:  I Ordered, and personally interpreted labs.  The pertinent results include:  reassuring labs  Imaging Studies ordered:  I ordered imaging studies including abd/pelvis CT I independently visualized and interpreted imaging which showed 8m L kidney stone with hydronephrosis and hydroureter I agree with the radiologist interpretation  Cardiac Monitoring:  The patient was maintained on a cardiac monitor.  I personally viewed and interpreted the cardiac monitored which showed an underlying rhythm of: NSR  Medicines ordered and prescription drug management:  I ordered medication including toradol  for kidney stone pain Reevaluation of the patient after these medicines showed that the patient improved I have reviewed the patients home medicines and have made adjustments as needed  Test Considered: renal [place] but CT scan was sufficient in identifying the kidney stone  Critical Interventions: IV pain medication  antiemetic   Problem List / ED Course: left flank pain  hematuria  Reevaluation:  After the interventions noted above, I reevaluated the patient and found  that they have :improved  Social Determinants of Health:   Dispostion:  After consideration of the diagnostic results and the patients response to treatment, I feel that the patent would benefit from outpt f/u.         Final Clinical Impression(s) / ED Diagnoses Final diagnoses:  Kidney stone on left side    Rx / DC Orders ED Discharge Orders          Ordered    oxyCODONE-acetaminophen (PERCOCET) 5-325 MG tablet  Every 6 hours PRN        01/02/22 1640    tamsulosin (FLOMAX) 0.4 MG CAPS capsule  Daily        01/02/22 1640    ondansetron (ZOFRAN) 4 MG tablet  Every 8 hours PRN        01/02/22 1640              TDomenic Moras PA-C 01/02/22 1644    PDavonna Belling MD 01/03/22 [phone]

## 2022-01-02 NOTE — ED Notes (Signed)
Patient transported to CT 

## 2022-02-08 LAB — HM MAMMOGRAPHY

## 2022-02-28 ENCOUNTER — Other Ambulatory Visit: Payer: Self-pay | Admitting: Internal Medicine

## 2022-03-05 ENCOUNTER — Telehealth: Payer: Self-pay | Admitting: Internal Medicine

## 2022-03-05 NOTE — Telephone Encounter (Signed)
Ok done

## 2022-03-05 NOTE — Telephone Encounter (Signed)
Pt has lab appt 03/26/2022, future orders expire 03/25/2022. Please extend lab orders. ?

## 2022-03-26 ENCOUNTER — Other Ambulatory Visit (INDEPENDENT_AMBULATORY_CARE_PROVIDER_SITE_OTHER): Payer: 59

## 2022-03-26 DIAGNOSIS — E7800 Pure hypercholesterolemia, unspecified: Secondary | ICD-10-CM

## 2022-03-26 DIAGNOSIS — E559 Vitamin D deficiency, unspecified: Secondary | ICD-10-CM

## 2022-03-26 LAB — CBC WITH DIFFERENTIAL/PLATELET
Basophils Absolute: 0 10*3/uL (ref 0.0–0.1)
Basophils Relative: 1 % (ref 0.0–3.0)
Eosinophils Absolute: 0.1 10*3/uL (ref 0.0–0.7)
Eosinophils Relative: 3 % (ref 0.0–5.0)
HCT: 41.3 % (ref 36.0–46.0)
Hemoglobin: 13.8 g/dL (ref 12.0–15.0)
Lymphocytes Relative: 24.5 % (ref 12.0–46.0)
Lymphs Abs: 1.1 10*3/uL (ref 0.7–4.0)
MCHC: 33.4 g/dL (ref 30.0–36.0)
MCV: 97.6 fl (ref 78.0–100.0)
Monocytes Absolute: 0.4 10*3/uL (ref 0.1–1.0)
Monocytes Relative: 8 % (ref 3.0–12.0)
Neutro Abs: 2.9 10*3/uL (ref 1.4–7.7)
Neutrophils Relative %: 63.5 % (ref 43.0–77.0)
Platelets: 271 10*3/uL (ref 150.0–400.0)
RBC: 4.24 Mil/uL (ref 3.87–5.11)
RDW: 12.5 % (ref 11.5–15.5)
WBC: 4.6 10*3/uL (ref 4.0–10.5)

## 2022-03-26 LAB — URINALYSIS, ROUTINE W REFLEX MICROSCOPIC
Bilirubin Urine: NEGATIVE
Hgb urine dipstick: NEGATIVE
Ketones, ur: NEGATIVE
Leukocytes,Ua: NEGATIVE
Nitrite: NEGATIVE
Specific Gravity, Urine: 1.015 (ref 1.000–1.030)
Total Protein, Urine: NEGATIVE
Urine Glucose: NEGATIVE
Urobilinogen, UA: 0.2 (ref 0.0–1.0)
pH: 8 (ref 5.0–8.0)

## 2022-03-26 LAB — HEPATIC FUNCTION PANEL
ALT: 37 U/L — ABNORMAL HIGH (ref 0–35)
AST: 22 U/L (ref 0–37)
Albumin: 4.1 g/dL (ref 3.5–5.2)
Alkaline Phosphatase: 60 U/L (ref 39–117)
Bilirubin, Direct: 0.1 mg/dL (ref 0.0–0.3)
Total Bilirubin: 0.6 mg/dL (ref 0.2–1.2)
Total Protein: 6.4 g/dL (ref 6.0–8.3)

## 2022-03-26 LAB — BASIC METABOLIC PANEL
BUN: 14 mg/dL (ref 6–23)
CO2: 29 mEq/L (ref 19–32)
Calcium: 9.5 mg/dL (ref 8.4–10.5)
Chloride: 106 mEq/L (ref 96–112)
Creatinine, Ser: 0.83 mg/dL (ref 0.40–1.20)
GFR: 80.4 mL/min (ref >60.00)
Glucose, Bld: 88 mg/dL (ref 70–99)
Potassium: 4.4 mEq/L (ref 3.5–5.1)
Sodium: 141 mEq/L (ref 135–145)

## 2022-03-26 LAB — LIPID PANEL
Cholesterol: 278 mg/dL — ABNORMAL HIGH (ref 0–200)
HDL: 61.1 mg/dL (ref >39.00)
NonHDL: 216.86
Total CHOL/HDL Ratio: 5
Triglycerides: 228 mg/dL — ABNORMAL HIGH (ref 0.0–149.0)
VLDL: 45.6 mg/dL — ABNORMAL HIGH (ref 0.0–40.0)

## 2022-03-26 LAB — LDL CHOLESTEROL, DIRECT: Direct LDL: 185 mg/dL

## 2022-03-26 LAB — VITAMIN D 25 HYDROXY (VIT D DEFICIENCY, FRACTURES): VITD: 29.32 ng/mL — ABNORMAL LOW (ref 30.00–100.00)

## 2022-03-26 LAB — TSH: TSH: 0.38 u[IU]/mL (ref 0.35–5.50)

## 2022-03-30 ENCOUNTER — Encounter: Payer: Self-pay | Admitting: Internal Medicine

## 2022-03-30 ENCOUNTER — Ambulatory Visit (INDEPENDENT_AMBULATORY_CARE_PROVIDER_SITE_OTHER): Payer: 59 | Admitting: Internal Medicine

## 2022-03-30 VITALS — BP 114/68 | HR 62 | Temp 97.9°F | Ht 69.0 in | Wt 179.0 lb

## 2022-03-30 DIAGNOSIS — R739 Hyperglycemia, unspecified: Secondary | ICD-10-CM | POA: Diagnosis not present

## 2022-03-30 DIAGNOSIS — E559 Vitamin D deficiency, unspecified: Secondary | ICD-10-CM | POA: Diagnosis not present

## 2022-03-30 DIAGNOSIS — N200 Calculus of kidney: Secondary | ICD-10-CM

## 2022-03-30 DIAGNOSIS — R9431 Abnormal electrocardiogram [ECG] [EKG]: Secondary | ICD-10-CM

## 2022-03-30 DIAGNOSIS — E7800 Pure hypercholesterolemia, unspecified: Secondary | ICD-10-CM

## 2022-03-30 DIAGNOSIS — Z0001 Encounter for general adult medical examination with abnormal findings: Secondary | ICD-10-CM | POA: Diagnosis not present

## 2022-03-30 DIAGNOSIS — E039 Hypothyroidism, unspecified: Secondary | ICD-10-CM | POA: Diagnosis not present

## 2022-03-30 DIAGNOSIS — E538 Deficiency of other specified B group vitamins: Secondary | ICD-10-CM | POA: Diagnosis not present

## 2022-03-30 MED ORDER — THERA-D 2000 50 MCG (2000 UT) PO TABS: ORAL_TABLET | ORAL | 99 refills | Status: AC

## 2022-03-30 MED ORDER — ALPRAZOLAM 0.5 MG PO TABS: ORAL_TABLET | ORAL | 5 refills | Status: AC

## 2022-03-30 MED ORDER — WEGOVY 0.5 MG/0.5ML ~~LOC~~ SOAJ: 0.5000 mg | SUBCUTANEOUS | 11 refills | Status: DC

## 2022-03-30 MED ORDER — VALACYCLOVIR HCL 1 G PO TABS: 1000.0000 mg | ORAL_TABLET | Freq: Two times a day (BID) | ORAL | 3 refills | Status: DC

## 2022-03-30 MED ORDER — ROSUVASTATIN CALCIUM 10 MG PO TABS: 10.0000 mg | ORAL_TABLET | Freq: Every day | ORAL | 3 refills | Status: DC

## 2022-03-30 NOTE — Assessment & Plan Note (Signed)
Lab Results  Component Value Date   LDLCALC 169 (H) 03/04/2021   Severe uncontrolled, goal ldl < 70 with hx of aortic atherosclerosis, pt to start crestor 10 qd, also for cardiac CT score

## 2022-03-30 NOTE — Patient Instructions (Addendum)
Please take all new medication as prescribed - the crestor for cholesterol, and the [facility] for wt loss if ok with the insurance  Please take OTC Vitamin D3 at 2000 units per day, indefinitely  Please continue all other medications as before, and refills have been done if requested.  Please have the pharmacy call with any other refills you may need.  Please continue your efforts at being more active, low cholesterol diet, and weight control.  You are otherwise up to date with prevention measures today.  Please keep your appointments with your specialists as you may have planned  We have discussed the Cardiac CT Score test to measure the calcification level (if any) in your heart arteries.  This test has been ordered in our [facility], so please call [facility] directly, as they prefer this, at [phone] to be scheduled.  Please make an Appointment to return for your 1 year visit, or sooner if needed, with Lab testing by Appointment as well, to be done about 3-5 days before at the [facility] (so this is for TWO appointments - please see the scheduling desk as you leave)   Due to the ongoing Covid 19 pandemic, our lab now requires an appointment for any labs done at our office.  If you need labs done and do not have an appointment, please call our office ahead of time to schedule before presenting to the lab for your testing.

## 2022-03-30 NOTE — Assessment & Plan Note (Signed)
Age and sex appropriate education and counseling updated with regular exercise and diet Referrals for preventative services - none needed Immunizations addressed - decliens covid booster Smoking counseling  - none needed Evidence for depression or other mood disorder - none significant Most recent labs reviewed. I have personally reviewed and have noted: 1) the patient's medical and social history 2) The patient's current medications and supplements 3) The patient's height, weight, and BMI have been recorded in the chart  

## 2022-03-30 NOTE — Assessment & Plan Note (Signed)
Lab Results  Component Value Date   TSH 0.38 03/26/2022   Stable, pt to continue levothyroxine 175

## 2022-03-30 NOTE — Assessment & Plan Note (Signed)
Lab Results  Component Value Date   HGBA1C 5.6 03/04/2021   Stable, pt to continue current medical treatment diet, and contd wt control with wegovy if ok with insurance

## 2022-03-30 NOTE — Assessment & Plan Note (Signed)
Last vitamin D Lab Results  Component Value Date   VD25OH 29.32 (L) 03/26/2022   Low, to start oral replacement

## 2022-03-30 NOTE — Assessment & Plan Note (Signed)
Recently symptomatic now resolved, to f/u urology as planned

## 2022-03-30 NOTE — Progress Notes (Signed)
Patient ID: Stephanie Schwartz, female   DOB: 09/15/1966, 56 y.o.   MRN: [id number]         Chief Complaint:: wellness exam and low vit d, hld, gallstones, hyperglycemia and obesity       HPI:  Stephanie Schwartz is a 56 y.o. female here for wellness exam; decliens covid booster, o/w up to date                        Also hard to lose wt but has been successful to 173 with mounjaro but now mounjaro stopped due to change in insurance coverage/  Not taking Vit d. Pt noted aortic atherosclerosis on recent CT renal and willing to start crestor, also for Cardiac CT score. Also noted gallstones, but Denies worsening reflux, abd pain, dysphagia, n/v, bowel change or blood.  Pt denies chest pain, increased sob or doe, wheezing, orthopnea, PND, increased LE swelling, palpitations, dizziness or syncope.   Pt denies polydipsia, polyuria, or new focal neuro s/s.    Pt denies fever, wt loss, night sweats, loss of appetite, or other constitutional symptoms   Denies hyper or hypo thyroid symptoms such as voice, skin or hair change.   Wt Readings from Last 3 Encounters:  03/30/22 179 lb (81.2 kg)  01/02/22 173 lb (78.5 kg)  10/20/21 178 lb (80.7 kg)   BP Readings from Last 3 Encounters:  03/30/22 114/68  01/02/22 (!) 149/80  10/20/21 131/80   Immunization History  Administered Date(s) Administered   Influenza Whole 12/17/2010   Influenza, Quadrivalent, Recombinant, Inj, Pf 07/31/2019, 07/30/2020   Influenza,inj,Quad PF,6+ Mos 12/03/2014, 08/03/2016   PFIZER(Purple Top)SARS-COV-2 Vaccination 01/09/2020, 02/06/2020, 09/09/2020   Td 02/03/2009   Tdap 05/21/2012   Zoster Recombinat (Shingrix) 03/02/2022  There are no preventive care reminders to display for this patient.    Past Medical History:  Diagnosis Date   ALLERGIC RHINITIS 03/01/2008   Allergy    Anxiety 02/25/2012   Arthritis    left thumb   ASTHMA    "allergy induced"   Asthma    seasonal   ASTHMATIC BRONCHITIS, ACUTE 10/14/2008   Heart  murmur    "maybe" (12/02/2014)   Hemangioma of liver    HYPERCHOLESTEROLEMIA 08/21/2007   HYPOTHYROIDISM    OTITIS MEDIA, SEROUS, ACUTE, LEFT 03/15/2008   Ovarian mass    PONV (postoperative nausea and vomiting)    SHINGLES 06/07/2008   Wheezing 10/14/2009   Past Surgical History:  Procedure Laterality Date   COLONOSCOPY  05/2019   cyst removed from neck area     age 5   [place] N/A 12/02/2014   Procedure: APPENDECTOMY LAPAROSCOPIC;  Surgeon: Ralene Ok, MD;  Location: [facility];  Service: General;  Laterality: N/A;   REDUCTION MAMMAPLASTY Bilateral ~ [place] N/A 08/16/2019   Procedure: XI ROBOTIC ASSISTED TOTAL HYSTERECTOMY WITH BILATERAL SALPINGO OOPHORECTOMY;  Surgeon: Everitt Amber, MD;  Location: [facility];  Service: Gynecology;  Laterality: N/A;   SHOULDER ARTHROSCOPY Left ~ 2009   WISDOM TOOTH EXTRACTION     with sedation   XI ROBOTIC ASSISTED LOWER ANTERIOR RESECTION N/A 08/16/2019   Procedure: XI ROBOTIC ASSISTED LOWER ANTERIOR RESECTION, FLEXIBLE SIGMOIDOSCOPY; ICG;  Surgeon: Ileana Roup, MD;  Location: [facility];  Service: General;  Laterality: N/A;    reports that she has never smoked. She has never used smokeless tobacco. She reports current alcohol use of about 14.0 standard  drinks per week. She reports that she does not use drugs. family history includes Alcohol abuse in an other family member; Breast cancer in her maternal aunt; Cancer in an other family member; Colon polyps in her father; Diabetes in an other family member; Hyperlipidemia in an other family member; Hypertension in an other family member; Prostate cancer in her father. No Known Allergies Current Outpatient Medications on File Prior to Visit  Medication Sig Dispense Refill   albuterol (VENTOLIN HFA) 108 (90 Base) MCG/ACT inhaler Inhale 2 puffs into the lungs every 6 (six) hours as needed for wheezing or shortness of  breath. 8 g 0   budesonide-formoterol (SYMBICORT) 160-4.5 MCG/ACT inhaler USE 2 INHALATIONS BY MOUTH  TWICE DAILY 10.2 g 11   escitalopram (LEXAPRO) 10 MG tablet TAKE 1 TABLET BY MOUTH DAILY  THEN STOP AS DIRECTED 90 tablet 2   fluticasone (FLONASE) 50 MCG/ACT nasal spray Place 2 sprays into both nostrils daily. 16 g 0   ibuprofen (ADVIL) 800 MG tablet Take 1 tablet (800 mg total) by mouth every 8 (eight) hours as needed for moderate pain. For AFTER surgery only 30 tablet 0   levothyroxine (SYNTHROID) 175 MCG tablet Take by mouth.     montelukast (SINGULAIR) 10 MG tablet TAKE 1 TABLET BY MOUTH  DAILY 90 tablet 3   estrogens, conjugated, (PREMARIN) 0.625 MG tablet Take 1 tablet (0.625 mg total) by mouth daily. Take daily for 21 days then do not take for 7 days. (Patient not taking: Reported on 03/30/2022) 30 tablet 11   No current facility-administered medications on file prior to visit.        ROS:  All others reviewed and negative.  Objective        PE:  BP 114/68 (BP Location: Right Arm, Patient Position: Sitting, Cuff Size: Large)   Pulse 62   Temp 97.9 F (36.6 C) (Oral)   Ht '5\' 9"'$  (1.753 m)   Wt 179 lb (81.2 kg)   LMP 07/30/2019 (Exact Date)   SpO2 98%   BMI 26.43 kg/m                 Constitutional: Pt appears in NAD               HENT: Head: NCAT.                Right Ear: External ear normal.                 Left Ear: External ear normal.                Eyes: . Pupils are equal, round, and reactive to light. Conjunctivae and EOM are normal               Nose: without d/c or deformity               Neck: Neck supple. Gross normal ROM               Cardiovascular: Normal rate and regular rhythm.                 Pulmonary/Chest: Effort normal and breath sounds without rales or wheezing.                Abd:  Soft, NT, ND, + BS, no organomegaly               Neurological: Pt is alert. At baseline orientation, motor grossly intact  Skin: Skin is warm. No rashes, no  other new lesions, LE edema - none               Psychiatric: Pt behavior is normal without agitation   Micro: none  Cardiac tracings I have personally interpreted today:  none  Pertinent Radiological findings (summarize): none   Lab Results  Component Value Date   WBC 4.6 03/26/2022   HGB 13.8 03/26/2022   HCT 41.3 03/26/2022   PLT 271.0 03/26/2022   GLUCOSE 88 03/26/2022   CHOL 278 (H) 03/26/2022   TRIG 228.0 (H) 03/26/2022   HDL 61.10 03/26/2022   LDLDIRECT 185.0 03/26/2022   LDLCALC 169 (H) 03/04/2021   ALT 37 (H) 03/26/2022   AST 22 03/26/2022   NA 141 03/26/2022   K 4.4 03/26/2022   CL 106 03/26/2022   CREATININE 0.83 03/26/2022   BUN 14 03/26/2022   CO2 29 03/26/2022   TSH 0.38 03/26/2022   INR 0.9 08/10/2019   HGBA1C 5.6 03/04/2021   Assessment/Plan:  Stephanie Schwartz is a 30 y.o. White or Caucasian [1] female with  has a past medical history of ALLERGIC RHINITIS (03/01/2008), Allergy, Anxiety (02/25/2012), Arthritis, ASTHMA, Asthma, ASTHMATIC BRONCHITIS, ACUTE (10/14/2008), Heart murmur, Hemangioma of liver, HYPERCHOLESTEROLEMIA (08/21/2007), HYPOTHYROIDISM, OTITIS MEDIA, SEROUS, ACUTE, LEFT (03/15/2008), Ovarian mass, PONV (postoperative nausea and vomiting), SHINGLES (06/07/2008), and Wheezing (10/14/2009).  Encounter for well adult exam with abnormal findings Age and sex appropriate education and counseling updated with regular exercise and diet Referrals for preventative services - none needed Immunizations addressed - decliens covid booster Smoking counseling  - none needed Evidence for depression or other mood disorder - none significant Most recent labs reviewed. I have personally reviewed and have noted: 1) the patient's medical and social history 2) The patient's current medications and supplements 3) The patient's height, weight, and BMI have been recorded in the chart   Vitamin D deficiency Last vitamin D Lab Results  Component Value Date   VD25OH  29.32 (L) 03/26/2022   Low, to start oral replacement   Hypothyroidism Lab Results  Component Value Date   TSH 0.38 03/26/2022   Stable, pt to continue levothyroxine 175   Hyperglycemia Lab Results  Component Value Date   HGBA1C 5.6 03/04/2021   Stable, pt to continue current medical treatment diet, and contd wt control with wegovy if ok with insurance   HYPERCHOLESTEROLEMIA Lab Results  Component Value Date   [place] 169 (H) 03/04/2021   Severe uncontrolled, goal ldl < 70 with hx of aortic atherosclerosis, pt to start crestor 10 qd, also for cardiac CT score   Nephrolithiasis Recently symptomatic now resolved, to f/u urology as planned  Followup: Return in about 1 year (around 03/31/2023).  Cathlean Cower, MD 03/30/2022 7:46 PM [facility]

## 2022-04-05 ENCOUNTER — Encounter: Payer: Self-pay | Admitting: Internal Medicine

## 2022-04-05 DIAGNOSIS — M7541 Impingement syndrome of right shoulder: Secondary | ICD-10-CM | POA: Insufficient documentation

## 2022-08-05 ENCOUNTER — Other Ambulatory Visit: Payer: Self-pay

## 2022-08-05 MED ORDER — WEGOVY 0.5 MG/0.5ML ~~LOC~~ SOAJ: 0.5000 mg | SUBCUTANEOUS | 11 refills | Status: DC

## 2022-11-06 ENCOUNTER — Other Ambulatory Visit: Payer: Self-pay | Admitting: Internal Medicine

## 2022-11-19 ENCOUNTER — Other Ambulatory Visit: Payer: Self-pay | Admitting: Family Medicine

## 2022-11-19 DIAGNOSIS — J40 Bronchitis, not specified as acute or chronic: Secondary | ICD-10-CM

## 2022-11-22 ENCOUNTER — Other Ambulatory Visit (HOSPITAL_COMMUNITY): Payer: Self-pay

## 2022-11-22 ENCOUNTER — Encounter (HOSPITAL_COMMUNITY): Payer: Self-pay

## 2022-11-22 MED ORDER — ALBUTEROL SULFATE HFA 108 (90 BASE) MCG/ACT IN AERS
2.0000 | INHALATION_SPRAY | Freq: Four times a day (QID) | RESPIRATORY_TRACT | 2 refills | Status: DC | PRN
  Filled 2022-11-22: qty 13.4, 25d supply, fill #0

## 2022-11-23 ENCOUNTER — Other Ambulatory Visit (HOSPITAL_COMMUNITY): Payer: Self-pay

## 2022-11-26 ENCOUNTER — Other Ambulatory Visit: Payer: Self-pay

## 2022-11-29 ENCOUNTER — Telehealth: Payer: Self-pay | Admitting: Internal Medicine

## 2022-11-29 NOTE — Telephone Encounter (Signed)
Patient called and wants her Wegovy 0.5 mg transferred to [facility] in [place] since it is on backorder everywhere else. Callback at [phone]

## 2022-11-30 MED ORDER — WEGOVY 0.5 MG/0.5ML ~~LOC~~ SOAJ
0.5000 mg | SUBCUTANEOUS | 11 refills | Status: DC
Start: 2022-11-30 — End: 2023-03-14

## 2022-11-30 NOTE — Telephone Encounter (Signed)
Ok this is done 

## 2022-12-01 ENCOUNTER — Ambulatory Visit: Payer: 59 | Admitting: Internal Medicine

## 2022-12-01 VITALS — BP 132/74 | HR 83 | Temp 98.0°F | Wt <= 1120 oz

## 2022-12-01 DIAGNOSIS — E6609 Other obesity due to excess calories: Secondary | ICD-10-CM | POA: Diagnosis not present

## 2022-12-01 DIAGNOSIS — Z6830 Body mass index (BMI) 30.0-30.9, adult: Secondary | ICD-10-CM

## 2022-12-01 DIAGNOSIS — R062 Wheezing: Secondary | ICD-10-CM

## 2022-12-01 DIAGNOSIS — R109 Unspecified abdominal pain: Secondary | ICD-10-CM | POA: Diagnosis not present

## 2022-12-01 DIAGNOSIS — E669 Obesity, unspecified: Secondary | ICD-10-CM | POA: Insufficient documentation

## 2022-12-01 LAB — POC COVID19 BINAXNOW: SARS Coronavirus 2 Ag: NEGATIVE

## 2022-12-01 MED ORDER — BUDESONIDE-FORMOTEROL FUMARATE 160-4.5 MCG/ACT IN AERO: INHALATION_SPRAY | RESPIRATORY_TRACT | 11 refills | Status: DC

## 2022-12-01 MED ORDER — ZEPBOUND 2.5 MG/0.5ML ~~LOC~~ SOAJ
2.5000 mg | SUBCUTANEOUS | 3 refills | Status: DC
Start: 2022-12-01 — End: 2024-02-02

## 2022-12-01 MED ORDER — METHYLPREDNISOLONE ACETATE 80 MG/ML IJ SUSP
80.0000 mg | Freq: Once | INTRAMUSCULAR | Status: AC
  Administered 2022-12-01: 80 mg via INTRAMUSCULAR

## 2022-12-01 MED ORDER — PREDNISONE 10 MG PO TABS: ORAL_TABLET | ORAL | 0 refills | Status: DC

## 2022-12-01 NOTE — Progress Notes (Signed)
Patient ID: Stephanie Schwartz, female   DOB: Feb 14, 1966, 57 y.o.   MRN: [id number]        Chief Complaint: follow up sob and wheezing       HPI:  Stephanie Schwartz is a 57 y.o. female here with c/o 3 days onset mild worsening sob with wheezing, tightness without fever, pain or productive cough, after attending a conference with exposure to something in the environment.  Pt denies chest pain, orthopnea, PND, increased LE swelling, palpitations, dizziness or syncope.  Pt denies fever, wt loss, night sweats, loss of appetite, or other constitutional symptoms  Has not been able to get ozempic recently as out of stock, hoping for zepbound start today.  Also has had mild right flank tender dull mild to mod, short lived a few days ago, wondering about GB but also has hx of left renal stone.  Denies urinary symptoms such as dysuria, frequency, urgency, flank pain, hematuria or n/v, fever, chills.  COVID neg at home yesterday       Wt Readings from Last 3 Encounters:  12/01/22 18 lb 2 oz (8.221 kg)  03/30/22 179 lb (81.2 kg)  01/02/22 173 lb (78.5 kg)   BP Readings from Last 3 Encounters:  12/01/22 132/74  03/30/22 114/68  01/02/22 (!) 149/80         Past Medical History:  Diagnosis Date   ALLERGIC RHINITIS 03/01/2008   Allergy    Anxiety 02/25/2012   Arthritis    left thumb   ASTHMA    "allergy induced"   Asthma    seasonal   ASTHMATIC BRONCHITIS, ACUTE 10/14/2008   Heart murmur    "maybe" (12/02/2014)   Hemangioma of liver    HYPERCHOLESTEROLEMIA 08/21/2007   HYPOTHYROIDISM    OTITIS MEDIA, SEROUS, ACUTE, LEFT 03/15/2008   Ovarian mass    PONV (postoperative nausea and vomiting)    SHINGLES 06/07/2008   Wheezing 10/14/2009   Past Surgical History:  Procedure Laterality Date   COLONOSCOPY  05/2019   cyst removed from neck area     age 70   [place] N/A 12/02/2014   Procedure: APPENDECTOMY LAPAROSCOPIC;  Surgeon: Ralene Ok, MD;  Location: [facility];  Service: General;   Laterality: N/A;   REDUCTION MAMMAPLASTY Bilateral ~ [place] N/A 08/16/2019   Procedure: XI ROBOTIC ASSISTED TOTAL HYSTERECTOMY WITH BILATERAL SALPINGO OOPHORECTOMY;  Surgeon: Everitt Amber, MD;  Location: [facility];  Service: Gynecology;  Laterality: N/A;   SHOULDER ARTHROSCOPY Left ~ 2009   WISDOM TOOTH EXTRACTION     with sedation   XI ROBOTIC ASSISTED LOWER ANTERIOR RESECTION N/A 08/16/2019   Procedure: XI ROBOTIC ASSISTED LOWER ANTERIOR RESECTION, FLEXIBLE SIGMOIDOSCOPY; ICG;  Surgeon: Ileana Roup, MD;  Location: [facility];  Service: General;  Laterality: N/A;    reports that she has never smoked. She has never used smokeless tobacco. She reports current alcohol use of about 14.0 standard drinks of alcohol per week. She reports that she does not use drugs. family history includes Alcohol abuse in an other family member; Breast cancer in her maternal aunt; Cancer in an other family member; Colon polyps in her father; Diabetes in an other family member; Hyperlipidemia in an other family member; Hypertension in an other family member; Prostate cancer in her father. No Known Allergies Current Outpatient Medications on File Prior to Visit  Medication Sig Dispense Refill   albuterol (VENTOLIN HFA) 108 (90 Base) MCG/ACT inhaler  Inhale 2 puffs into the lungs every 6 (six) hours as needed for wheezing or shortness of breath. 8 g 2   ALPRAZolam (XANAX) 0.5 MG tablet TAKE ONE TABLET BY MOUTH TWICE A DAY AS NEEDED FOR ANXIETY 60 tablet 5   Cholecalciferol (THERA-D 2000) 50 MCG (2000 UT) TABS 1 tab by mouth once daily 30 tablet 99   escitalopram (LEXAPRO) 10 MG tablet TAKE 1 TABLET BY MOUTH DAILY  THEN STOP AS DIRECTED 90 tablet 1   estrogens, conjugated, (PREMARIN) 0.625 MG tablet Take 1 tablet (0.625 mg total) by mouth daily. Take daily for 21 days then do not take for 7 days. 30 tablet 11   fluticasone (FLONASE) 50 MCG/ACT nasal  spray Place 2 sprays into both nostrils daily. 16 g 0   ibuprofen (ADVIL) 800 MG tablet Take 1 tablet (800 mg total) by mouth every 8 (eight) hours as needed for moderate pain. For AFTER surgery only 30 tablet 0   levothyroxine (SYNTHROID) 175 MCG tablet Take by mouth.     montelukast (SINGULAIR) 10 MG tablet TAKE 1 TABLET BY MOUTH DAILY 90 tablet 1   rosuvastatin (CRESTOR) 10 MG tablet Take 1 tablet (10 mg total) by mouth daily. 90 tablet 3   Semaglutide-Weight Management (WEGOVY) 0.5 MG/0.5ML SOAJ Inject 0.5 mg into the skin once a week. 2 mL 11   valACYclovir (VALTREX) 1000 MG tablet Take 1 tablet (1,000 mg total) by mouth 2 (two) times daily. 14 tablet 3   No current facility-administered medications on file prior to visit.        ROS:  All others reviewed and negative.  Objective        PE:  BP 132/74   Pulse 83   Temp 98 F (36.7 C) (Temporal)   Wt 18 lb 2 oz (8.221 kg)   LMP 07/30/2019 (Exact Date)   SpO2 97%   BMI 2.68 kg/m                 Constitutional: Pt appears in NAD               HENT: Head: NCAT.                Right Ear: External ear normal.                 Left Ear: External ear normal.                Eyes: . Pupils are equal, round, and reactive to light. Conjunctivae and EOM are normal               Nose: without d/c or deformity               Neck: Neck supple. Gross normal ROM               Cardiovascular: Normal rate and regular rhythm.                 Pulmonary/Chest: Effort normal and breath sounds decreased without rales with mild bilateral wheezing.                Abd:  Soft, NT, ND, + BS, no organomegaly, no flank tender               Neurological: Pt is alert. At baseline orientation, motor grossly intact               Skin: Skin is warm. No rashes, no other new lesions,  LE edema - none               Psychiatric: Pt behavior is normal without agitation   Micro: none  Cardiac tracings I have personally interpreted today:  none  Pertinent  Radiological findings (summarize): none   Lab Results  Component Value Date   WBC 4.6 03/26/2022   HGB 13.8 03/26/2022   HCT 41.3 03/26/2022   PLT 271.0 03/26/2022   GLUCOSE 88 03/26/2022   CHOL 278 (H) 03/26/2022   TRIG 228.0 (H) 03/26/2022   HDL 61.10 03/26/2022   LDLDIRECT 185.0 03/26/2022   LDLCALC 169 (H) 03/04/2021   ALT 37 (H) 03/26/2022   AST 22 03/26/2022   NA 141 03/26/2022   K 4.4 03/26/2022   CL 106 03/26/2022   CREATININE 0.83 03/26/2022   BUN 14 03/26/2022   CO2 29 03/26/2022   TSH 0.38 03/26/2022   INR 0.9 08/10/2019   HGBA1C 5.6 03/04/2021   SARS Coronavirus 2 Ag Negative Negative   Assessment/Plan:  JEHIELI BRASSELL is a 57 y.o. White or Caucasian [1] female with  has a past medical history of ALLERGIC RHINITIS (03/01/2008), Allergy, Anxiety (02/25/2012), Arthritis, ASTHMA, Asthma, ASTHMATIC BRONCHITIS, ACUTE (10/14/2008), Heart murmur, Hemangioma of liver, HYPERCHOLESTEROLEMIA (08/21/2007), HYPOTHYROIDISM, OTITIS MEDIA, SEROUS, ACUTE, LEFT (03/15/2008), Ovarian mass, PONV (postoperative nausea and vomiting), SHINGLES (06/07/2008), and Wheezing (10/14/2009).  Wheezing C/w asthma exacerbation - for depomedrol IM 80 mg, prednisone taper,  to f/u any worsening symptoms or concerns  Obesity Ok for zepbound 25 mg weeekly  Right flank pain None currently, but suspect could be related to a right renal stone transiently active, given the hx of of the left renal stone, declines ua today but will return with any worsening s/s  Followup: Return if symptoms worsen or fail to improve.  Cathlean Cower, MD 12/01/2022 8:58 PM [facility]

## 2022-12-01 NOTE — Assessment & Plan Note (Addendum)
None currently, but suspect could be related to a right renal stone transiently active, given the hx of of the left renal stone, declines ua today but will return with any worsening s/s

## 2022-12-01 NOTE — Patient Instructions (Addendum)
You had the steroid shot today  Please take all new medication as prescribed - the prednisone  Please continue all other medications as before, including the albuterol, as well as the Symbicort restart (to [employer])  Please take all new medication as prescribed - the zepbound if less expensive than the Wegovy (to the [employer])  Please have the pharmacy call with any other refills you may need.  Please keep your appointments with your specialists as you may have planned  Please call if for urine testing if you have further right flank pain  Your Covid testing is negative

## 2022-12-01 NOTE — Assessment & Plan Note (Signed)
Ok for zepbound 25 mg [employer]

## 2022-12-01 NOTE — Assessment & Plan Note (Signed)
C/w asthma exacerbation - for depomedrol IM 80 mg, prednisone taper,  to f/u any worsening symptoms or concerns

## 2022-12-17 ENCOUNTER — Telehealth: Payer: Self-pay | Admitting: Internal Medicine

## 2022-12-17 NOTE — Telephone Encounter (Signed)
      Pt call about prior authorization been denied due to lack of information. Pt states the the medication was approve before.   Please call pt with update.         Semaglutide-Weight Management (WEGOVY) 0.5 MG/0.5ML

## 2022-12-17 NOTE — Telephone Encounter (Signed)
Pt call about prior authorization been denied due to lack of information. Pt states the the medication was approve before.  Please call pt with update.  Rx Semaglutide-Weight Management (WEGOVY) 0.5 MG/0.5ML

## 2022-12-20 ENCOUNTER — Other Ambulatory Visit (HOSPITAL_COMMUNITY): Payer: Self-pay

## 2022-12-20 NOTE — Telephone Encounter (Signed)
Pharmacy Patient Advocate Encounter   Received notification from [place] that prior authorization for [facility] 0.5MG/0.5ML auto-injectors is required/requested.  Per Test Claim: PA required   PA submitted on 12/20/22 to (ins) [employer] via CoverMyMeds Key [id number] Status is pending

## 2022-12-21 ENCOUNTER — Encounter: Payer: Self-pay | Admitting: Internal Medicine

## 2022-12-21 NOTE — Telephone Encounter (Signed)
Received a fax regarding Prior Authorization from [employer] for [facility].   Authorization has been DENIED because you did not meet the following requirements. Reauthorization of this medication is covered only if: you have a weight loss of greater than or equal to 5% of baseline body weight.   Phone# [phone] ext [phone]

## 2022-12-24 NOTE — Telephone Encounter (Signed)
PA was denied due to no proof that pt has lost 5% of body weight since beginning therapy with Wegovy. Current weight on Epic is listed as 18 lbs, please either correct weight or have pt submit proof of current BMI.

## 2022-12-30 ENCOUNTER — Other Ambulatory Visit (HOSPITAL_COMMUNITY): Payer: Self-pay

## 2022-12-30 ENCOUNTER — Telehealth: Payer: Self-pay

## 2022-12-30 NOTE — Telephone Encounter (Signed)
Patient Advocate Encounter  Received a fax from [employer] regarding Prior Authorization for Zepbound 2.'5MG'$ /0.5ML pen-injectors.   Authorization has been DENIED due to

## 2022-12-30 NOTE — Telephone Encounter (Signed)
Pharmacy Patient Advocate Encounter   Received notification that prior authorization for Zepbound 2.'5mg'$ /0.7m is required/requested.  Per Test Claim: Prior authorization required   PA submitted on 12/30/22 to (ins) [employer] via CoverMyMeds Key  # [id number] Status is pending

## 2023-01-03 NOTE — Telephone Encounter (Signed)
Apologies for the delay, the new information has been submitted to [employer] [facility] via CMM

## 2023-01-04 NOTE — Telephone Encounter (Signed)
Patient states she wasn't on it long enough last year to see any results took med for month 1/2 and then was told med on back order.Marland KitchenJohny Chess

## 2023-01-04 NOTE — Telephone Encounter (Signed)
Sorry - I meant any Other medication besides wegovy that has not worked well?  thanks

## 2023-01-04 NOTE — Telephone Encounter (Signed)
Has patient been on medication in the past?  If so, we could try the letter , o/w I would not pursue the appeal.

## 2023-01-04 NOTE — Telephone Encounter (Signed)
Pt is wanting to Appeal PA. Will need letter stating why med was rx. Have any meds been rx in past and any diagnosis that pt may have.Marland KitchenJohny Schwartz

## 2023-01-05 ENCOUNTER — Encounter: Payer: Self-pay | Admitting: Internal Medicine

## 2023-01-05 NOTE — Telephone Encounter (Signed)
Ok to Please Print and Send recent Letter from today

## 2023-01-06 NOTE — Telephone Encounter (Signed)
Pt sent fax #. Faxed letter to [phone],mb

## 2023-01-06 NOTE — Telephone Encounter (Signed)
Printed letter.. sent pt msg need fax # to send appeal letter...Stephanie Schwartz

## 2023-03-14 ENCOUNTER — Other Ambulatory Visit: Payer: Self-pay

## 2023-03-14 ENCOUNTER — Encounter: Payer: Self-pay | Admitting: Internal Medicine

## 2023-03-14 MED ORDER — WEGOVY 1 MG/0.5ML ~~LOC~~ SOAJ: 1.0000 mg | SUBCUTANEOUS | 3 refills | Status: DC

## 2023-04-04 ENCOUNTER — Telehealth: Payer: Self-pay | Admitting: Internal Medicine

## 2023-04-04 ENCOUNTER — Encounter: Payer: Self-pay | Admitting: Internal Medicine

## 2023-04-04 DIAGNOSIS — R9431 Abnormal electrocardiogram [ECG] [EKG]: Secondary | ICD-10-CM

## 2023-04-04 DIAGNOSIS — R739 Hyperglycemia, unspecified: Secondary | ICD-10-CM

## 2023-04-04 DIAGNOSIS — E7800 Pure hypercholesterolemia, unspecified: Secondary | ICD-10-CM

## 2023-04-04 NOTE — Telephone Encounter (Signed)
Ok this is done 

## 2023-04-04 NOTE — Telephone Encounter (Signed)
Patient called and asked if her CT cardiac scoring scan can be reordered. It has expired and she would still like to get it done. She has her physical scheduled for 04/19/2023. Best callback is [phone].

## 2023-04-04 NOTE — Telephone Encounter (Signed)
Called and let Pt know

## 2023-04-07 ENCOUNTER — Encounter: Payer: 59 | Admitting: Internal Medicine

## 2023-04-10 ENCOUNTER — Encounter: Payer: Self-pay | Admitting: Internal Medicine

## 2023-04-13 MED ORDER — SCOPOLAMINE 1 MG/3DAYS TD PT72: 1.0000 | MEDICATED_PATCH | TRANSDERMAL | 0 refills | Status: AC

## 2023-04-14 ENCOUNTER — Other Ambulatory Visit: Payer: Self-pay | Admitting: Internal Medicine

## 2023-04-19 ENCOUNTER — Encounter: Payer: Self-pay | Admitting: Internal Medicine

## 2023-04-19 ENCOUNTER — Ambulatory Visit (INDEPENDENT_AMBULATORY_CARE_PROVIDER_SITE_OTHER): Payer: 59 | Admitting: Internal Medicine

## 2023-04-19 VITALS — BP 120/80 | HR 83 | Temp 98.3°F | Ht 69.0 in | Wt 181.0 lb

## 2023-04-19 DIAGNOSIS — E039 Hypothyroidism, unspecified: Secondary | ICD-10-CM

## 2023-04-19 DIAGNOSIS — E6609 Other obesity due to excess calories: Secondary | ICD-10-CM

## 2023-04-19 DIAGNOSIS — E559 Vitamin D deficiency, unspecified: Secondary | ICD-10-CM | POA: Diagnosis not present

## 2023-04-19 DIAGNOSIS — R739 Hyperglycemia, unspecified: Secondary | ICD-10-CM

## 2023-04-19 DIAGNOSIS — E7800 Pure hypercholesterolemia, unspecified: Secondary | ICD-10-CM

## 2023-04-19 DIAGNOSIS — Z0001 Encounter for general adult medical examination with abnormal findings: Secondary | ICD-10-CM | POA: Diagnosis not present

## 2023-04-19 DIAGNOSIS — Z6830 Body mass index (BMI) 30.0-30.9, adult: Secondary | ICD-10-CM

## 2023-04-19 MED ORDER — ROSUVASTATIN CALCIUM 20 MG PO TABS: 20.0000 mg | ORAL_TABLET | Freq: Every day | ORAL | 3 refills | Status: DC

## 2023-04-19 NOTE — Progress Notes (Signed)
Patient ID: Stephanie Schwartz, female   DOB: 1966-07-08, 57 y.o.   MRN: [id number]         Chief Complaint:: wellness exam and hld, obesity, low thyroid, hyperglycemia, low vit d,        HPI:  Stephanie Schwartz is a 57 y.o. female here for wellness exam; for shingrix and tdap at the pharmacy, o/w up to date                      Also TSH recently mild high, for f/u lab with Dr Talmage Nap soon.  LDL most recent there was 782 by pt report, Pt going to [place] soon and does not want increased wegovy for now, but may consider on return.  Pt denies chest pain, increased sob or doe, wheezing, orthopnea, PND, increased LE swelling, palpitations, dizziness or syncope.   Pt denies polydipsia, polyuria, or new focal neuro s/s.    Pt denies fever, wt loss, night sweats, loss of appetite, or other constitutional symptoms  Denies hyper or hypo thyroid symptoms such as voice, skin or hair change.   Wt Readings from Last 3 Encounters:  04/19/23 181 lb (82.1 kg)  12/01/22 18 lb 2 oz (8.221 kg)  03/30/22 179 lb (81.2 kg)   BP Readings from Last 3 Encounters:  04/19/23 120/80  12/01/22 132/74  03/30/22 114/68   Immunization History  Administered Date(s) Administered   Influenza Whole 12/17/2010   Influenza, Quadrivalent, Recombinant, Inj, Pf 07/31/2019, 07/30/2020   Influenza,inj,Quad PF,6+ Mos 12/03/2014, 08/03/2016   PFIZER(Purple Top)SARS-COV-2 Vaccination 01/09/2020, 02/06/2020, 09/09/2020   Td 02/03/2009   Tdap 05/21/2012   Zoster Recombinat (Shingrix) 03/02/2022   Health Maintenance Due  Topic Date Due   Zoster Vaccines- Shingrix (2 of 2) 04/27/2022   DTaP/Tdap/Td (3 - Td or Tdap) 05/21/2022      Past Medical History:  Diagnosis Date   ALLERGIC RHINITIS 03/01/2008   Allergy    Anxiety 02/25/2012   Arthritis    left thumb   ASTHMA    "allergy induced"   Asthma    seasonal   ASTHMATIC BRONCHITIS, ACUTE 10/14/2008   Heart murmur    "maybe" (12/02/2014)   Hemangioma of liver     HYPERCHOLESTEROLEMIA 08/21/2007   HYPOTHYROIDISM    OTITIS MEDIA, SEROUS, ACUTE, LEFT 03/15/2008   Ovarian mass    PONV (postoperative nausea and vomiting)    SHINGLES 06/07/2008   Wheezing 10/14/2009   Past Surgical History:  Procedure Laterality Date   COLONOSCOPY  05/2019   cyst removed from neck area     age 47   LAPAROSCOPIC APPENDECTOMY N/A 12/02/2014   Procedure: APPENDECTOMY LAPAROSCOPIC;  Surgeon: Axel Filler, MD;  Location: [facility];  Service: General;  Laterality: N/A;   REDUCTION MAMMAPLASTY Bilateral ~ 1995   ROBOTIC ASSISTED TOTAL HYSTERECTOMY WITH BILATERAL SALPINGO OOPHERECTOMY N/A 08/16/2019   Procedure: XI ROBOTIC ASSISTED TOTAL HYSTERECTOMY WITH BILATERAL SALPINGO OOPHORECTOMY;  Surgeon: Adolphus Birchwood, MD;  Location: [facility];  Service: Gynecology;  Laterality: N/A;   SHOULDER ARTHROSCOPY Left ~ 2009   WISDOM TOOTH EXTRACTION     with sedation   XI ROBOTIC ASSISTED LOWER ANTERIOR RESECTION N/A 08/16/2019   Procedure: XI ROBOTIC ASSISTED LOWER ANTERIOR RESECTION, FLEXIBLE SIGMOIDOSCOPY; ICG;  Surgeon: Andria Meuse, MD;  Location: [facility];  Service: General;  Laterality: N/A;    reports that she has never smoked. She has never used smokeless tobacco. She reports current alcohol use of about 14.0 standard drinks  of alcohol per week. She reports that she does not use drugs. family history includes Alcohol abuse in an other family member; Breast cancer in her maternal aunt; Cancer in an other family member; Colon polyps in her father; Diabetes in an other family member; Hyperlipidemia in an other family member; Hypertension in an other family member; Prostate cancer in her father. No Known Allergies Current Outpatient Medications on File Prior to Visit  Medication Sig Dispense Refill   albuterol (VENTOLIN HFA) 108 (90 Base) MCG/ACT inhaler Inhale 2 puffs into the lungs every 6 (six) hours as needed for wheezing or shortness of breath. 8 g 2   ALPRAZolam (XANAX) 0.5 MG  tablet TAKE ONE TABLET BY MOUTH TWICE A DAY AS NEEDED FOR ANXIETY 60 tablet 5   budesonide-formoterol (SYMBICORT) 160-4.5 MCG/ACT inhaler USE 2 INHALATIONS BY MOUTH  TWICE DAILY 10.2 g 11   Cholecalciferol (THERA-D 2000) 50 MCG (2000 UT) TABS 1 tab by mouth once daily 30 tablet 99   escitalopram (LEXAPRO) 10 MG tablet TAKE 1 TABLET BY MOUTH DAILY  THEN STOP AS DIRECTED 90 tablet 1   estrogens, conjugated, (PREMARIN) 0.625 MG tablet Take 1 tablet (0.625 mg total) by mouth daily. Take daily for 21 days then do not take for 7 days. 30 tablet 11   fluticasone (FLONASE) 50 MCG/ACT nasal spray Place 2 sprays into both nostrils daily. 16 g 0   ibuprofen (ADVIL) 800 MG tablet Take 1 tablet (800 mg total) by mouth every 8 (eight) hours as needed for moderate pain. For AFTER surgery only 30 tablet 0   levothyroxine (SYNTHROID) 175 MCG tablet Take by mouth.     montelukast (SINGULAIR) 10 MG tablet TAKE 1 TABLET BY MOUTH DAILY 90 tablet 3   predniSONE (DELTASONE) 10 MG tablet 3 tabs by mouth per day for 3 days,2tabs per day for 3 days,1tab per day for 3 days 18 tablet 0   scopolamine (TRANSDERM-SCOP) 1 MG/3DAYS Place 1 patch (1.5 mg total) onto the skin every 3 (three) days. 10 patch 0   Semaglutide-Weight Management (WEGOVY) 1 MG/0.5ML SOAJ Inject 1 mg into the skin once a week. 6 mL 3   tirzepatide (ZEPBOUND) 2.5 MG/0.5ML Pen Inject 2.5 mg into the skin once a week. 6 mL 3   valACYclovir (VALTREX) 1000 MG tablet Take 1 tablet (1,000 mg total) by mouth 2 (two) times daily. 14 tablet 3   No current facility-administered medications on file prior to visit.        ROS:  All others reviewed and negative.  Objective        PE:  BP 120/80 (BP Location: Left Arm, Patient Position: Sitting, Cuff Size: Normal)   Pulse 83   Temp 98.3 F (36.8 C) (Oral)   Ht 5\' 9"  (1.753 m)   Wt 181 lb (82.1 kg)   LMP 07/30/2019 (Exact Date)   SpO2 98%   BMI 26.73 kg/m                 Constitutional: Pt appears in NAD                HENT: Head: NCAT.                Right Ear: External ear normal.                 Left Ear: External ear normal.                Eyes: . Pupils are equal,  round, and reactive to light. Conjunctivae and EOM are normal               Nose: without d/c or deformity               Neck: Neck supple. Gross normal ROM               Cardiovascular: Normal rate and regular rhythm.                 Pulmonary/Chest: Effort normal and breath sounds without rales or wheezing.                Abd:  Soft, NT, ND, + BS, no organomegaly               Neurological: Pt is alert. At baseline orientation, motor grossly intact               Skin: Skin is warm. No rashes, no other new lesions, LE edema - none               Psychiatric: Pt behavior is normal without agitation , mild nervous  Micro: none  Cardiac tracings I have personally interpreted today:  none  Pertinent Radiological findings (summarize): none   Lab Results  Component Value Date   WBC 4.6 03/26/2022   HGB 13.8 03/26/2022   HCT 41.3 03/26/2022   PLT 271.0 03/26/2022   GLUCOSE 88 03/26/2022   CHOL 278 (H) 03/26/2022   TRIG 228.0 (H) 03/26/2022   HDL 61.10 03/26/2022   LDLDIRECT 185.0 03/26/2022   LDLCALC 169 (H) 03/04/2021   ALT 37 (H) 03/26/2022   AST 22 03/26/2022   NA 141 03/26/2022   K 4.4 03/26/2022   CL 106 03/26/2022   CREATININE 0.83 03/26/2022   BUN 14 03/26/2022   CO2 29 03/26/2022   TSH 0.38 03/26/2022   INR 0.9 08/10/2019   HGBA1C 5.6 03/04/2021   Assessment/Plan:  Stephanie Schwartz is a 55 y.o. White or Caucasian [1] female with  has a past medical history of ALLERGIC RHINITIS (03/01/2008), Allergy, Anxiety (02/25/2012), Arthritis, ASTHMA, Asthma, ASTHMATIC BRONCHITIS, ACUTE (10/14/2008), Heart murmur, Hemangioma of liver, HYPERCHOLESTEROLEMIA (08/21/2007), HYPOTHYROIDISM, OTITIS MEDIA, SEROUS, ACUTE, LEFT (03/15/2008), Ovarian mass, PONV (postoperative nausea and vomiting), SHINGLES (06/07/2008), and Wheezing  (10/14/2009).  Encounter for well adult exam with abnormal findings Age and sex appropriate education and counseling updated with regular exercise and diet Referrals for preventative services - none needed Immunizations addressed - for tdap and shingrix at pharmacy Smoking counseling  - none needed Evidence for depression or other mood disorder - stable chronic anxiety, ADD Most recent labs reviewed. I have personally reviewed and have noted: 1) the patient's medical and social history 2) The patient's current medications and supplements 3) The patient's height, weight, and BMI have been recorded in the chart   Hypothyroidism Lab Results  Component Value Date   TSH 0.38 03/26/2022   Stable, pt to continue levothyroxine 175 mcg qd  HYPERCHOLESTEROLEMIA Lab Results  Component Value Date   LDLCALC 169 (H) 03/04/2021   Improved but still LDL 135 recently per pt, for increased crestor 20 mg qd   Hyperglycemia Lab Results  Component Value Date   HGBA1C 5.6 03/04/2021   Stable, pt to continue current medical treatment  - diet, wt control   Vitamin D deficiency Last vitamin D Lab Results  Component Value Date   VD25OH 29.32 (L) 03/26/2022   Low, to start oral replacement  Obesity Persistent, to consider increased wegovy to 2 mg weekly when returns from [place] upcoming trip  Followup: Return in about 1 year (around 04/18/2024).  Oliver Barre, MD 04/23/2023 1:27 PM [facility]

## 2023-04-19 NOTE — Patient Instructions (Addendum)
Please have your Shingrix (shingles) shots done at your local pharmacy.  Ok to increase the crestor 20 mg per day  Please call if you would want the wegovy increased to 2 mg  Please continue all other medications as before, and refills have been done if requested.  Please have the pharmacy call with any other refills you may need.  Please continue your efforts at being more active, low cholesterol diet, and weight control.  You are otherwise up to date with prevention measures today.  Please keep your appointments with your specialists as you may have planned  Please make an Appointment to return for your 1 year visit, or sooner if needed

## 2023-04-23 ENCOUNTER — Encounter: Payer: Self-pay | Admitting: Internal Medicine

## 2023-04-23 NOTE — Assessment & Plan Note (Signed)
Age and sex appropriate education and counseling updated with regular exercise and diet Referrals for preventative services - none needed Immunizations addressed - for tdap and shingrix at pharmacy Smoking counseling  - none needed Evidence for depression or other mood disorder - stable chronic anxiety, ADD Most recent labs reviewed. I have personally reviewed and have noted: 1) the patient's medical and social history 2) The patient's current medications and supplements 3) The patient's height, weight, and BMI have been recorded in the chart

## 2023-04-23 NOTE — Assessment & Plan Note (Signed)
Lab Results  Component Value Date   HGBA1C 5.6 03/04/2021   Stable, pt to continue current medical treatment  - diet, wt control

## 2023-04-23 NOTE — Assessment & Plan Note (Signed)
Lab Results  Component Value Date   TSH 0.38 03/26/2022   Stable, pt to continue levothyroxine 175 mcg qd

## 2023-04-23 NOTE — Assessment & Plan Note (Signed)
Persistent, to consider increased wegovy to 2 mg weekly when returns from [place] upcoming trip

## 2023-04-23 NOTE — Assessment & Plan Note (Signed)
Last vitamin D Lab Results  Component Value Date   VD25OH 29.32 (L) 03/26/2022   Low, to start oral replacement  

## 2023-04-23 NOTE — Assessment & Plan Note (Signed)
Lab Results  Component Value Date   LDLCALC 169 (H) 03/04/2021   Improved but still LDL 135 recently per pt, for increased crestor 20 mg qd

## 2023-04-27 DIAGNOSIS — M7062 Trochanteric bursitis, left hip: Secondary | ICD-10-CM | POA: Insufficient documentation

## 2023-04-27 DIAGNOSIS — M25552 Pain in left hip: Secondary | ICD-10-CM | POA: Insufficient documentation

## 2023-05-02 ENCOUNTER — Other Ambulatory Visit: Payer: Self-pay

## 2023-05-02 MED ORDER — WEGOVY 1 MG/0.5ML ~~LOC~~ SOAJ: 1.0000 mg | SUBCUTANEOUS | 3 refills | Status: DC

## 2023-05-09 ENCOUNTER — Ambulatory Visit
Admission: RE | Admit: 2023-05-09 | Discharge: 2023-05-09 | Disposition: A | Discharge status: Home / Self Care | Payer: No Typology Code available for payment source | Source: Ambulatory Visit | Attending: Internal Medicine | Admitting: Internal Medicine

## 2023-05-09 DIAGNOSIS — E7800 Pure hypercholesterolemia, unspecified: Secondary | ICD-10-CM

## 2023-05-09 DIAGNOSIS — R739 Hyperglycemia, unspecified: Secondary | ICD-10-CM

## 2023-05-09 DIAGNOSIS — R9431 Abnormal electrocardiogram [ECG] [EKG]: Secondary | ICD-10-CM

## 2023-05-11 ENCOUNTER — Encounter: Payer: Self-pay | Admitting: Internal Medicine

## 2023-05-11 MED ORDER — WEGOVY 1.7 MG/0.75ML ~~LOC~~ SOAJ: 1.7000 mg | SUBCUTANEOUS | 3 refills | Status: DC

## 2023-05-24 NOTE — Telephone Encounter (Addendum)
Pt need PA for [facility] 1.7mg   Submitted w/ (Key: [id number]) [employer] is reviewing your PA request.../lmb

## 2023-05-24 NOTE — Telephone Encounter (Signed)
Rec'd determination med has been approved. Request Reference Number: [id number]. WEGOVY INJ 1.7MG  is approved through 05/23/2024. Your patient may now fill this prescription and it will be covered.. Authorization Expiration Date: May 23, 2024.Notified pt of approval,,,/lmb

## 2023-08-23 ENCOUNTER — Encounter: Payer: Self-pay | Admitting: Internal Medicine

## 2023-08-23 MED ORDER — VALACYCLOVIR HCL 1 G PO TABS: 1000.0000 mg | ORAL_TABLET | Freq: Two times a day (BID) | ORAL | 3 refills | Status: DC

## 2023-09-03 ENCOUNTER — Encounter: Payer: Self-pay | Admitting: Internal Medicine

## 2023-09-03 DIAGNOSIS — J40 Bronchitis, not specified as acute or chronic: Secondary | ICD-10-CM

## 2023-09-05 MED ORDER — ALBUTEROL SULFATE HFA 108 (90 BASE) MCG/ACT IN AERS
2.0000 | INHALATION_SPRAY | Freq: Four times a day (QID) | RESPIRATORY_TRACT | 2 refills | Status: DC | PRN
Start: 2023-09-05 — End: 2023-12-09

## 2023-09-05 MED ORDER — BUDESONIDE-FORMOTEROL FUMARATE 160-4.5 MCG/ACT IN AERO: INHALATION_SPRAY | RESPIRATORY_TRACT | 11 refills | Status: AC

## 2023-10-06 ENCOUNTER — Other Ambulatory Visit: Payer: Self-pay | Admitting: Internal Medicine

## 2023-10-06 ENCOUNTER — Other Ambulatory Visit: Payer: Self-pay

## 2023-12-06 ENCOUNTER — Ambulatory Visit: Payer: 59 | Admitting: Internal Medicine

## 2023-12-09 ENCOUNTER — Ambulatory Visit: Payer: 59 | Admitting: Internal Medicine

## 2023-12-09 ENCOUNTER — Encounter: Payer: Self-pay | Admitting: Internal Medicine

## 2023-12-09 VITALS — BP 118/64 | HR 66 | Temp 99.5°F | Ht 69.0 in | Wt 175.0 lb

## 2023-12-09 DIAGNOSIS — E559 Vitamin D deficiency, unspecified: Secondary | ICD-10-CM

## 2023-12-09 DIAGNOSIS — R739 Hyperglycemia, unspecified: Secondary | ICD-10-CM | POA: Diagnosis not present

## 2023-12-09 DIAGNOSIS — J40 Bronchitis, not specified as acute or chronic: Secondary | ICD-10-CM

## 2023-12-09 DIAGNOSIS — R051 Acute cough: Secondary | ICD-10-CM

## 2023-12-09 DIAGNOSIS — R062 Wheezing: Secondary | ICD-10-CM | POA: Diagnosis not present

## 2023-12-09 MED ORDER — METHYLPREDNISOLONE ACETATE 80 MG/ML IJ SUSP
80.0000 mg | Freq: Once | INTRAMUSCULAR | Status: AC
Start: 2023-12-09 — End: 2023-12-09
  Administered 2023-12-09: 80 mg via INTRAMUSCULAR

## 2023-12-09 MED ORDER — HYDROCODONE BIT-HOMATROP MBR 5-1.5 MG/5ML PO SOLN: 5.0000 mL | Freq: Four times a day (QID) | ORAL | 0 refills | Status: AC | PRN

## 2023-12-09 MED ORDER — ALBUTEROL SULFATE HFA 108 (90 BASE) MCG/ACT IN AERS
2.0000 | INHALATION_SPRAY | Freq: Four times a day (QID) | RESPIRATORY_TRACT | 2 refills | Status: AC | PRN
Start: 2023-12-09 — End: ?

## 2023-12-09 MED ORDER — DOXYCYCLINE HYCLATE 100 MG PO TABS: 100.0000 mg | ORAL_TABLET | Freq: Two times a day (BID) | ORAL | 0 refills | Status: AC

## 2023-12-09 MED ORDER — PREDNISONE 10 MG PO TABS: ORAL_TABLET | ORAL | 0 refills | Status: AC

## 2023-12-09 NOTE — Progress Notes (Signed)
 Patient ID: Stephanie Schwartz, female   DOB: [birth year], 58 y.o.   MRN: [id number]        Chief Complaint: follow up cough, wheezing, hyperglycemia, low vit d       HPI:  Stephanie Schwartz is a 58 y.o. female Here with acute onset mild to mod 2-3 days ST, HA, general weakness and malaise, with prod cough greenish sputum, but Pt denies chest pain, increased sob or doe, wheezing, orthopnea, PND, increased LE swelling, palpitations, dizziness or syncope except for onset mild wheezing sob since yesterday.   Pt denies polydipsia, polyuria, or new focal neuro s/s.    Pt denies wt loss, night sweats, loss of appetite, or other constitutional symptoms       Wt Readings from Last 3 Encounters:  12/09/23 175 lb (79.4 kg)  04/19/23 181 lb (82.1 kg)  12/01/22 18 lb 2 oz (8.221 kg)   BP Readings from Last 3 Encounters:  12/09/23 118/64  04/19/23 120/80  12/01/22 132/74         Past Medical History:  Diagnosis Date   ALLERGIC RHINITIS 03/01/2008   Allergy    Anxiety 02/25/2012   Arthritis    left thumb   ASTHMA    allergy induced   Asthma    seasonal   ASTHMATIC BRONCHITIS, ACUTE 10/14/2008   Heart murmur    maybe (12/02/2014)   Hemangioma of liver    HYPERCHOLESTEROLEMIA 08/21/2007   HYPOTHYROIDISM    OTITIS MEDIA, SEROUS, ACUTE, LEFT 03/15/2008   Ovarian mass    PONV (postoperative nausea and vomiting)    SHINGLES 06/07/2008   Wheezing 10/14/2009   Past Surgical History:  Procedure Laterality Date   COLONOSCOPY  05/2019   cyst removed from neck area     age 30   LAPAROSCOPIC APPENDECTOMY N/A 12/02/2014   Procedure: APPENDECTOMY LAPAROSCOPIC;  Surgeon: Lynda Leos, MD;  Location: [facility] OR;  Service: General;  Laterality: N/A;   REDUCTION MAMMAPLASTY Bilateral ~ 1995   ROBOTIC ASSISTED TOTAL HYSTERECTOMY WITH BILATERAL SALPINGO OOPHERECTOMY N/A 08/16/2019   Procedure: XI ROBOTIC ASSISTED TOTAL HYSTERECTOMY WITH BILATERAL SALPINGO OOPHORECTOMY;  Surgeon: Eloy Herring, MD;  Location: [facility]  ORS;  Service: Gynecology;  Laterality: N/A;   SHOULDER ARTHROSCOPY Left ~ 2009   WISDOM TOOTH EXTRACTION     with sedation   XI ROBOTIC ASSISTED LOWER ANTERIOR RESECTION N/A 08/16/2019   Procedure: XI ROBOTIC ASSISTED LOWER ANTERIOR RESECTION, FLEXIBLE SIGMOIDOSCOPY; ICG;  Surgeon: Teresa Lonni HERO, MD;  Location: [facility] ORS;  Service: General;  Laterality: N/A;    reports that she has never smoked. She has never used smokeless tobacco. She reports current alcohol use of about 14.0 standard drinks of alcohol per week. She reports that she does not use drugs. family history includes Alcohol abuse in an other family member; Breast cancer in her maternal aunt; Cancer in an other family member; Colon polyps in her father; Diabetes in an other family member; Hyperlipidemia in an other family member; Hypertension in an other family member; Prostate cancer in her father. No Known Allergies Current Outpatient Medications on File Prior to Visit  Medication Sig Dispense Refill   ALPRAZolam  (XANAX ) 0.5 MG tablet TAKE ONE TABLET BY MOUTH TWICE A DAY AS NEEDED FOR ANXIETY 60 tablet 5   budesonide -formoterol  (SYMBICORT ) 160-4.5 MCG/ACT inhaler USE 2 INHALATIONS BY MOUTH  TWICE DAILY 10.2 g 11   Cholecalciferol (THERA-D 2000) 50 MCG (2000 UT) TABS 1 tab by mouth once daily 30 tablet 99  escitalopram  (LEXAPRO ) 10 MG tablet TAKE 1 TABLET BY MOUTH DAILY  THEN STOP AS DIRECTED 90 tablet 1   estrogens , conjugated, (PREMARIN ) 0.625 MG tablet Take 1 tablet (0.625 mg total) by mouth daily. Take daily for 21 days then do not take for 7 days. 30 tablet 11   fluticasone  (FLONASE ) 50 MCG/ACT nasal spray Place 2 sprays into both nostrils daily. 16 g 0   ibuprofen  (ADVIL ) 800 MG tablet Take 1 tablet (800 mg total) by mouth every 8 (eight) hours as needed for moderate pain. For AFTER surgery only 30 tablet 0   levothyroxine  (SYNTHROID ) 175 MCG tablet Take by mouth.     montelukast  (SINGULAIR ) 10 MG tablet TAKE 1 TABLET BY  MOUTH DAILY 90 tablet 3   rosuvastatin  (CRESTOR ) 20 MG tablet Take 1 tablet (20 mg total) by mouth daily. 90 tablet 3   scopolamine  (TRANSDERM-SCOP) 1 MG/3DAYS Place 1 patch (1.5 mg total) onto the skin every 3 (three) days. 10 patch 0   tirzepatide  (ZEPBOUND ) 2.5 MG/0.5ML Pen Inject 2.5 mg into the skin once a week. 6 mL 3   valACYclovir  (VALTREX ) 1000 MG tablet Take 1 tablet (1,000 mg total) by mouth 2 (two) times daily. 14 tablet 3   WEGOVY  1.7 MG/0.75ML SOAJ Inject 1.7 MG into THE SKIN ONCE a WEEK. 3 mL 3   No current facility-administered medications on file prior to visit.        ROS:  All others reviewed and negative.  Objective        PE:  BP 118/64 (BP Location: Right Arm, Patient Position: Sitting, Cuff Size: Normal)   Pulse 66   Temp 99.5 F (37.5 C) (Oral)   Ht 5' 9 (1.753 m)   Wt 175 lb (79.4 kg)   LMP 07/30/2019 (Exact Date)   SpO2 95%   BMI 25.84 kg/m                 Constitutional: Pt appears midl ill               HENT: Head: NCAT.                Right Ear: External ear normal.                 Left Ear: External ear normal. Bilat tm's with mild erythema.  Max sinus areas non tender.  Pharynx with mild erythema, no exudate               Eyes: . Pupils are equal, round, and reactive to light. Conjunctivae and EOM are normal               Nose: without d/c or deformity               Neck: Neck supple. Gross normal ROM               Cardiovascular: Normal rate and regular rhythm.                 Pulmonary/Chest: Effort normal and breath sounds without rales but with few mild wheezing.                Abd:  Soft, NT, ND, + BS, no organomegaly               Neurological: Pt is alert. At baseline orientation, motor grossly intact               Skin: Skin is warm. No rashes,  no other new lesions, LE edema - none               Psychiatric: Pt behavior is normal without agitation   Micro: none  Cardiac tracings I have personally interpreted today:  none  Pertinent  Radiological findings (summarize): none   Lab Results  Component Value Date   WBC 4.6 03/26/2022   HGB 13.8 03/26/2022   HCT 41.3 03/26/2022   PLT 271.0 03/26/2022   GLUCOSE 88 03/26/2022   CHOL 278 (H) 03/26/2022   TRIG 228.0 (H) 03/26/2022   HDL 61.10 03/26/2022   LDLDIRECT 185.0 03/26/2022   LDLCALC 169 (H) 03/04/2021   ALT 37 (H) 03/26/2022   AST 22 03/26/2022   NA 141 03/26/2022   K 4.4 03/26/2022   CL 106 03/26/2022   CREATININE 0.83 03/26/2022   BUN 14 03/26/2022   CO2 29 03/26/2022   TSH 0.38 03/26/2022   INR 0.9 08/10/2019   HGBA1C 5.6 03/04/2021   Assessment/Plan:  Stephanie Schwartz is a 51 y.o. White or Caucasian [1] female with  has a past medical history of ALLERGIC RHINITIS (03/01/2008), Allergy, Anxiety (02/25/2012), Arthritis, ASTHMA, Asthma, ASTHMATIC BRONCHITIS, ACUTE (10/14/2008), Heart murmur, Hemangioma of liver, HYPERCHOLESTEROLEMIA (08/21/2007), HYPOTHYROIDISM, OTITIS MEDIA, SEROUS, ACUTE, LEFT (03/15/2008), Ovarian mass, PONV (postoperative nausea and vomiting), SHINGLES (06/07/2008), and Wheezing (10/14/2009).  Cough Mild to mod, for antibx course doxycycline  100 bid, cough med prn,  to f/u any worsening symptoms or concerns  Hyperglycemia Lab Results  Component Value Date   HGBA1C 5.6 03/04/2021   Stable, pt to continue current medical treatment  - diet,wt control   Vitamin D  deficiency Last vitamin D  Lab Results  Component Value Date   VD25OH 29.32 (L) 03/26/2022   Low, to start oral replacement   Wheezing Mild to mod, for prednisone  taper, depomedrol 80 mg IM, continue inhaler prn,  to f/u any worsening symptoms or concerns  Followup: Return if symptoms worsen or fail to improve.  Lynwood Rush, MD 12/11/2023 5:41 PM [facility] Internal Medicine

## 2023-12-09 NOTE — Patient Instructions (Signed)
You had the steroid shot today  Please take all new medication as prescribed - the antibiotic, cough medicine, prednisone, and inhaler as needed  Please continue all other medications as before, and refills have been done if requested.  Please have the pharmacy call with any other refills you may need.  Please keep your appointments with your specialists as you may have planned

## 2023-12-11 ENCOUNTER — Encounter: Payer: Self-pay | Admitting: Internal Medicine

## 2023-12-11 NOTE — Assessment & Plan Note (Signed)
Last vitamin D Lab Results  Component Value Date   VD25OH 29.32 (L) 03/26/2022   Low, to start oral replacement  

## 2023-12-11 NOTE — Assessment & Plan Note (Signed)
 Mild to mod, for antibx course - doxycycline 100 bid, cough med prn, to f/u any worsening symptoms or concerns

## 2023-12-11 NOTE — Assessment & Plan Note (Signed)
Lab Results  Component Value Date   HGBA1C 5.6 03/04/2021   Stable, pt to continue current medical treatment  - diet, wt control

## 2023-12-11 NOTE — Assessment & Plan Note (Addendum)
 Mild to mod, for prednisone  taper, depomedrol 80 mg IM, continue inhaler prn,  to f/u any worsening symptoms or concerns

## 2023-12-12 IMAGING — CT CT RENAL STONE PROTOCOL
2 of 4 series · 15 of 46 positions shown, 17 images · non-contrast
Comparison: 05/25/2019

CLINICAL DATA: Left flank pain for 2 weeks, hematuria



[Series 2: axial st · axial · 0.81mm/px · z∈[+812,+1237]mm · 12 of 95 slices shown, 14 images]
[im 5/95  soft-tissue]
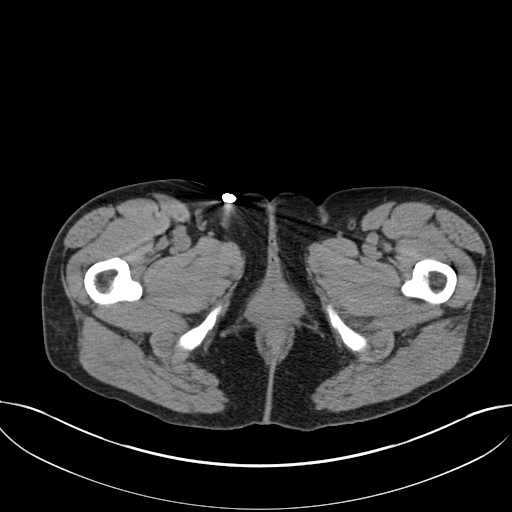
[im 5/95  bone]
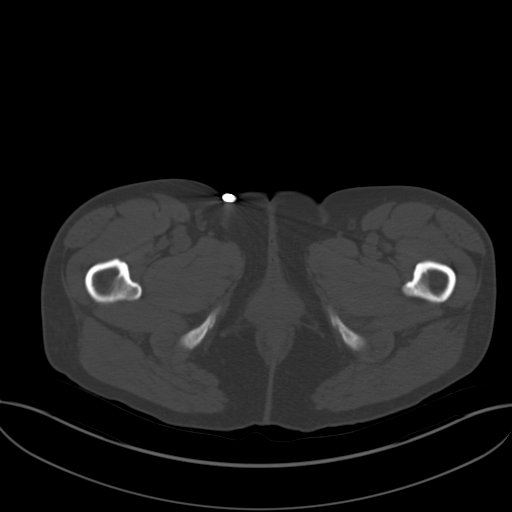
[im 15/95  soft-tissue]
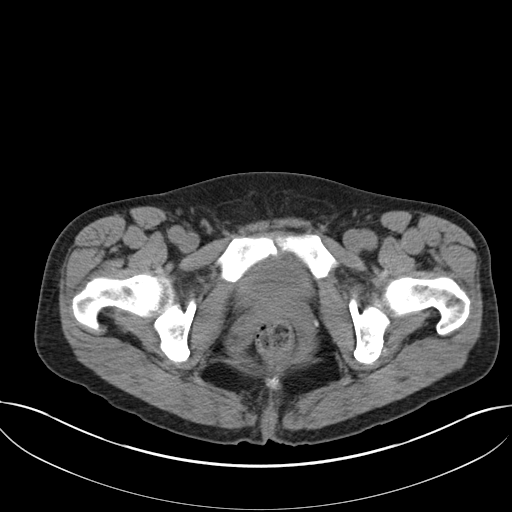
[im 20/95  soft-tissue]
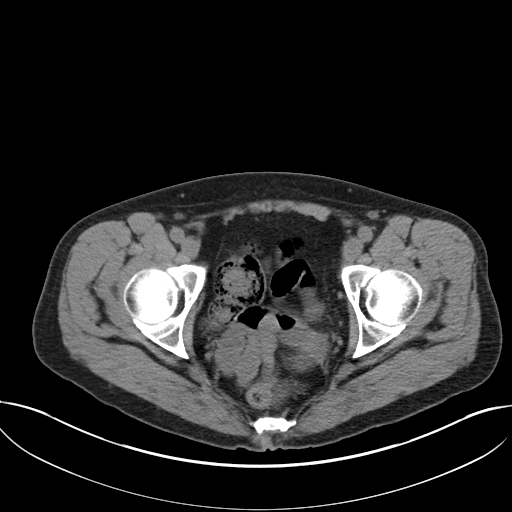
[im 30/95  soft-tissue]
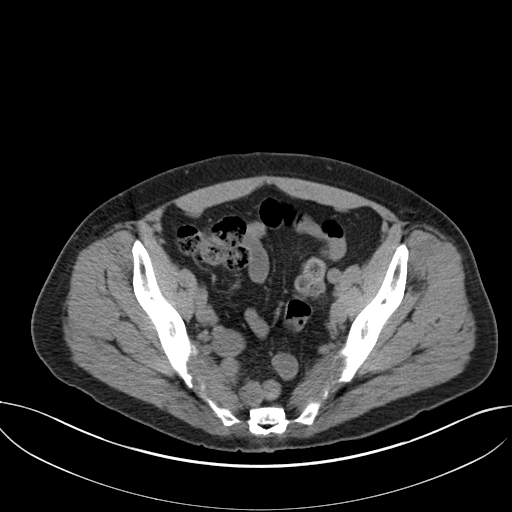
[im 35/95  soft-tissue]
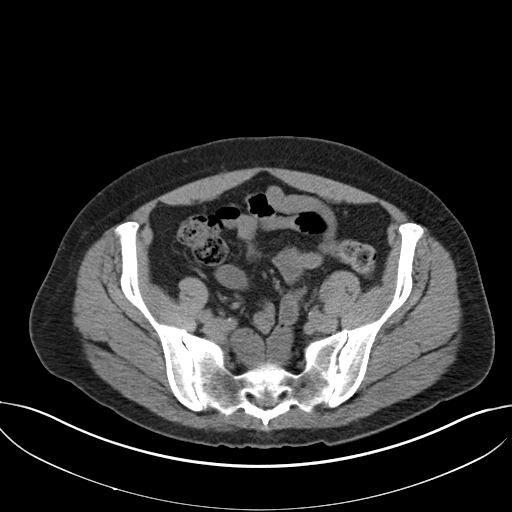
[im 45/95  soft-tissue]
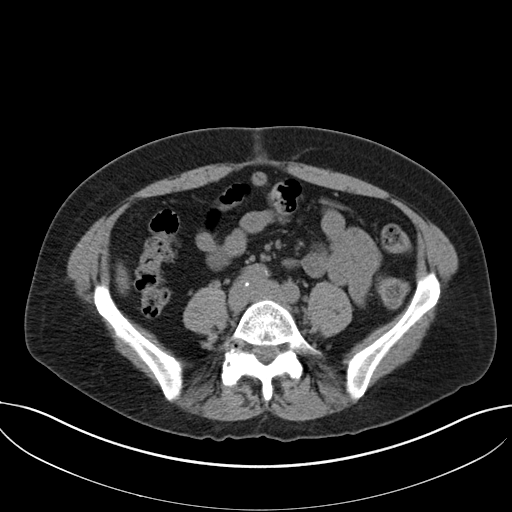
[im 50/95  soft-tissue]
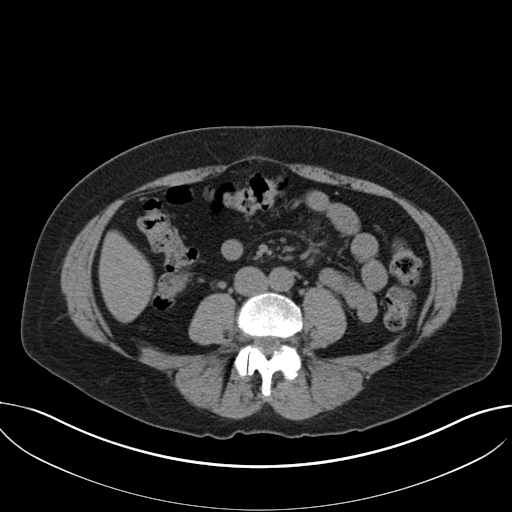
[im 60/95  soft-tissue]
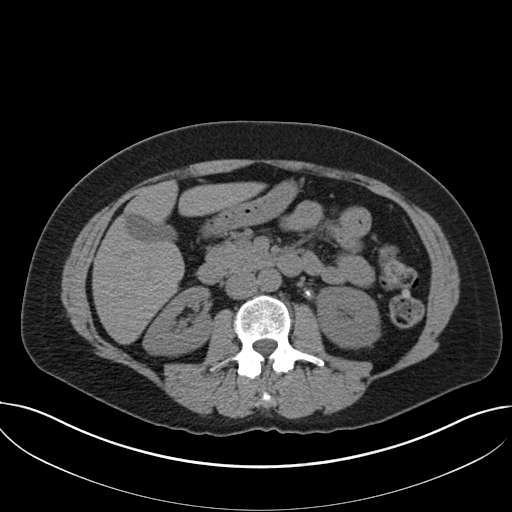
[im 65/95  soft-tissue]
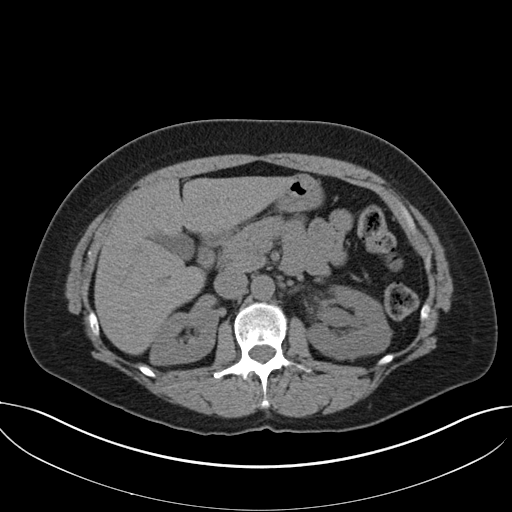
[im 65/95  bone]
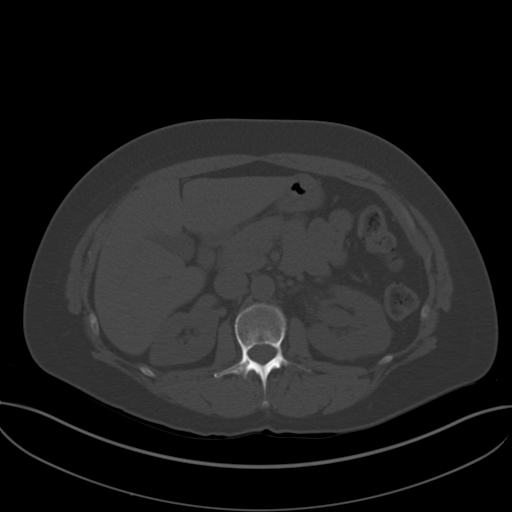
[im 75/95  soft-tissue]
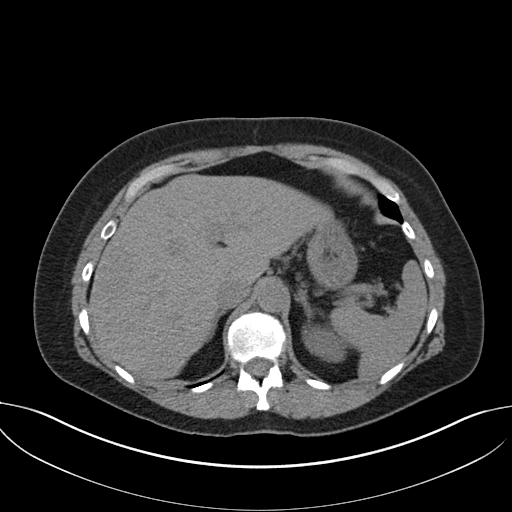
[im 80/95  soft-tissue]
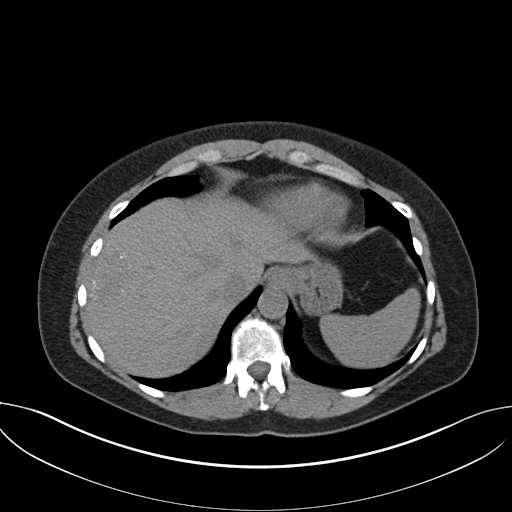
[im 90/95  soft-tissue]
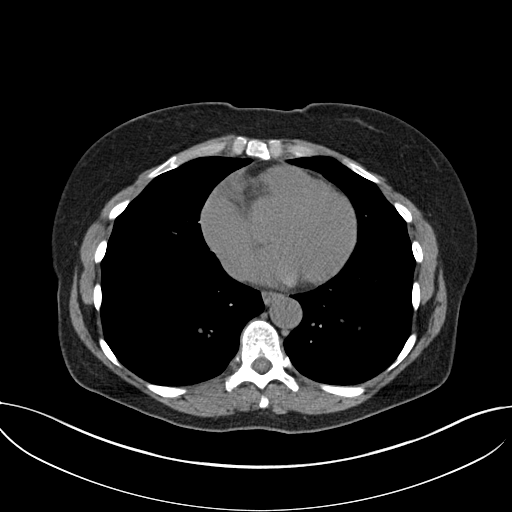

[Series 3: coronal · coronal · 0.77mm/px · 3 of 144 slices shown]
[im 48/144  soft-tissue]
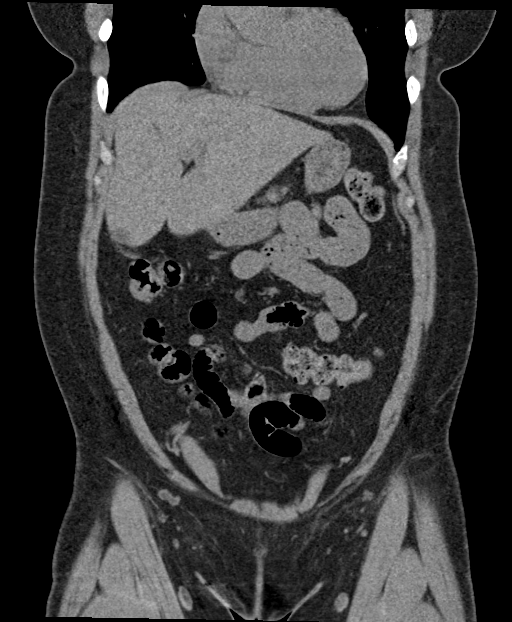
[im 64/144  soft-tissue]
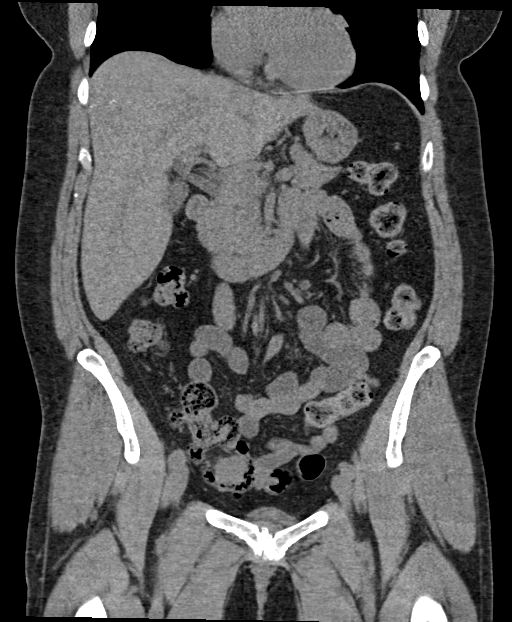
[im 80/144  soft-tissue]
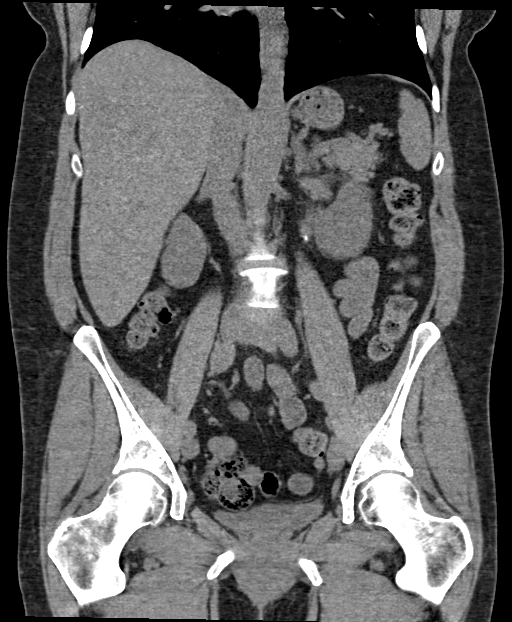

[15 of 46 positions shown; findings below may reference images not displayed]

FINDINGS: Lower chest: No acute abnormality.

Hepatobiliary: Unchanged, faintly calcified hypodense subcapsular
lesion of the anterior right lobe of the liver measuring 5.2 x
cm (series 2, image 17). Hepatomegaly, maximum coronal span 22.1 cm.
Small gallstones in the gallbladder fundus (series 2, image 39). No
gallbladder wall thickening, or biliary dilatation.

Pancreas: Unremarkable. No pancreatic ductal dilatation or
surrounding inflammatory changes.

Spleen: Normal in size without significant abnormality.

Adrenals/Urinary Tract: Adrenal glands are unremarkable. There is a
0.6 cm calculus in the proximal third of the left ureter with mild
associated hydronephrosis and proximal hydroureter (series 2, image
37). No right-sided calculi. Bladder is unremarkable.

Stomach/Bowel: Stomach is within normal limits. Appendix appears
normal. No evidence of bowel wall thickening, distention, or
inflammatory changes. Status post rectosigmoid colon resection and
reanastomosis

Vascular/Lymphatic: Aortic atherosclerosis. No enlarged abdominal or
pelvic lymph nodes.

Reproductive: Status post hysterectomy.

Other: No abdominal wall hernia or abnormality. No ascites.

Musculoskeletal: No acute or significant osseous findings.
IMPRESSION: 1. There is a 0.6 cm calculus in the proximal third of the left
ureter with mild associated hydronephrosis and proximal hydroureter.
No right-sided calculi or right-sided hydronephrosis.
2. Status post rectosigmoid colon resection and reanastomosis.
3. Hepatomegaly, maximum coronal span 22.1 cm.
4. Unchanged, faintly calcified hypodense subcapsular lesion of the
anterior right lobe of the liver measuring 5.2 x 3.5 cm, benign and
most likely a hemangioma, better characterized by previous contrast
enhanced examinations and MR.
5. Cholelithiasis without evidence of acute cholecystitis.

Aortic Atherosclerosis ([id number]).

## 2023-12-26 ENCOUNTER — Encounter: Payer: Self-pay | Admitting: Internal Medicine

## 2023-12-26 ENCOUNTER — Other Ambulatory Visit: Payer: Self-pay

## 2023-12-26 MED ORDER — VALACYCLOVIR HCL 1 G PO TABS: 1000.0000 mg | ORAL_TABLET | Freq: Two times a day (BID) | ORAL | 3 refills | Status: AC

## 2024-01-11 ENCOUNTER — Other Ambulatory Visit: Payer: Self-pay | Admitting: Internal Medicine

## 2024-01-11 ENCOUNTER — Other Ambulatory Visit: Payer: Self-pay

## 2024-01-24 LAB — HM MAMMOGRAPHY

## 2024-02-02 ENCOUNTER — Encounter: Payer: Self-pay | Admitting: Internal Medicine

## 2024-02-02 MED ORDER — WEGOVY 2.4 MG/0.75ML ~~LOC~~ SOAJ: 2.4000 mg | SUBCUTANEOUS | 11 refills | Status: AC

## 2024-04-03 ENCOUNTER — Other Ambulatory Visit: Payer: Self-pay | Admitting: Internal Medicine

## 2024-04-04 ENCOUNTER — Other Ambulatory Visit: Payer: Self-pay

## 2024-04-20 ENCOUNTER — Encounter: Payer: 59 | Admitting: Internal Medicine

## 2024-05-07 ENCOUNTER — Other Ambulatory Visit (HOSPITAL_COMMUNITY): Payer: Self-pay

## 2024-06-02 ENCOUNTER — Encounter: Payer: Self-pay | Admitting: Gastroenterology

## 2024-06-30 ENCOUNTER — Other Ambulatory Visit: Payer: Self-pay | Admitting: Internal Medicine

## 2024-08-23 ENCOUNTER — Other Ambulatory Visit: Payer: Self-pay | Admitting: Endocrinology

## 2024-08-23 DIAGNOSIS — K7689 Other specified diseases of liver: Secondary | ICD-10-CM

## 2024-10-15 ENCOUNTER — Encounter: Payer: Self-pay | Admitting: Internal Medicine

## 2024-10-15 MED ORDER — ESCITALOPRAM OXALATE 10 MG PO TABS: ORAL_TABLET | ORAL | 1 refills | Status: AC
